# Patient Record
Sex: Female | Born: 1935
Health system: Southern US, Community
[De-identification: ages and names within clinical notes are randomized; demographics above are authoritative.]

## PROBLEM LIST (undated history)

## (undated) DIAGNOSIS — R079 Chest pain, unspecified: Secondary | ICD-10-CM

## (undated) DIAGNOSIS — J449 Chronic obstructive pulmonary disease, unspecified: Secondary | ICD-10-CM

## (undated) DIAGNOSIS — I5189 Other ill-defined heart diseases: Secondary | ICD-10-CM

## (undated) DIAGNOSIS — I480 Paroxysmal atrial fibrillation: Secondary | ICD-10-CM

## (undated) DIAGNOSIS — H269 Unspecified cataract: Secondary | ICD-10-CM

## (undated) DIAGNOSIS — I10 Essential (primary) hypertension: Secondary | ICD-10-CM

## (undated) HISTORY — DX: Chronic obstructive pulmonary disease, unspecified: J44.9

## (undated) HISTORY — PX: OTHER SURGICAL HISTORY: SHX169

## (undated) HISTORY — DX: Essential (primary) hypertension: I10

## (undated) HISTORY — PX: TOTAL HIP ARTHROPLASTY: SHX124

## (undated) HISTORY — DX: Unspecified cataract: H26.9

---
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
## 1959-03-12 HISTORY — PX: HERNIA REPAIR: SHX51

## 1992-07-11 HISTORY — PX: OTHER SURGICAL HISTORY: SHX169

## 1994-07-11 HISTORY — PX: OTHER SURGICAL HISTORY: SHX169

## 1998-07-11 HISTORY — PX: OTHER SURGICAL HISTORY: SHX169

## 2001-05-02 ENCOUNTER — Encounter (INDEPENDENT_AMBULATORY_CARE_PROVIDER_SITE_OTHER): Payer: Self-pay

## 2001-05-02 ENCOUNTER — Other Ambulatory Visit: Admission: RE | Admit: 2001-05-02 | Discharge: 2001-05-02 | Payer: Self-pay | Admitting: Gastroenterology

## 2001-06-18 ENCOUNTER — Other Ambulatory Visit: Admission: RE | Admit: 2001-06-18 | Discharge: 2001-06-18 | Payer: Self-pay | Admitting: Family Medicine

## 2004-03-25 ENCOUNTER — Other Ambulatory Visit: Payer: Self-pay

## 2004-03-31 ENCOUNTER — Ambulatory Visit (HOSPITAL_COMMUNITY): Admission: RE | Admit: 2004-03-31 | Discharge: 2004-03-31 | Payer: Self-pay | Admitting: *Deleted

## 2005-04-19 ENCOUNTER — Ambulatory Visit: Payer: Self-pay | Admitting: Family Medicine

## 2006-04-20 ENCOUNTER — Ambulatory Visit: Payer: Self-pay | Admitting: Family Medicine

## 2006-05-01 ENCOUNTER — Ambulatory Visit: Payer: Self-pay | Admitting: Gastroenterology

## 2006-05-15 ENCOUNTER — Ambulatory Visit: Payer: Self-pay | Admitting: Gastroenterology

## 2006-10-25 ENCOUNTER — Ambulatory Visit: Payer: Self-pay | Admitting: Family Medicine

## 2006-12-01 ENCOUNTER — Ambulatory Visit: Payer: Self-pay | Admitting: Internal Medicine

## 2006-12-06 ENCOUNTER — Ambulatory Visit: Payer: Self-pay | Admitting: Internal Medicine

## 2006-12-06 ENCOUNTER — Inpatient Hospital Stay (HOSPITAL_BASED_OUTPATIENT_CLINIC_OR_DEPARTMENT_OTHER): Admission: RE | Admit: 2006-12-06 | Discharge: 2006-12-06 | Payer: Self-pay | Admitting: Internal Medicine

## 2006-12-12 ENCOUNTER — Encounter: Payer: Self-pay | Admitting: Internal Medicine

## 2006-12-12 ENCOUNTER — Ambulatory Visit: Payer: Self-pay

## 2006-12-13 ENCOUNTER — Ambulatory Visit: Payer: Self-pay | Admitting: Internal Medicine

## 2007-01-08 ENCOUNTER — Ambulatory Visit: Payer: Self-pay | Admitting: Internal Medicine

## 2007-01-08 ENCOUNTER — Ambulatory Visit: Payer: Self-pay | Admitting: Critical Care Medicine

## 2007-02-26 ENCOUNTER — Ambulatory Visit: Payer: Self-pay | Admitting: Critical Care Medicine

## 2007-03-23 ENCOUNTER — Ambulatory Visit: Payer: Self-pay | Admitting: Critical Care Medicine

## 2007-08-06 DIAGNOSIS — J449 Chronic obstructive pulmonary disease, unspecified: Secondary | ICD-10-CM

## 2007-08-06 DIAGNOSIS — J438 Other emphysema: Secondary | ICD-10-CM | POA: Insufficient documentation

## 2007-08-06 DIAGNOSIS — J45909 Unspecified asthma, uncomplicated: Secondary | ICD-10-CM | POA: Insufficient documentation

## 2007-08-06 DIAGNOSIS — I10 Essential (primary) hypertension: Secondary | ICD-10-CM | POA: Insufficient documentation

## 2007-08-07 ENCOUNTER — Ambulatory Visit: Payer: Self-pay | Admitting: Critical Care Medicine

## 2007-12-13 ENCOUNTER — Ambulatory Visit: Payer: Self-pay | Admitting: Family Medicine

## 2007-12-25 ENCOUNTER — Ambulatory Visit: Payer: Self-pay | Admitting: Critical Care Medicine

## 2008-06-20 ENCOUNTER — Ambulatory Visit: Payer: Self-pay | Admitting: Critical Care Medicine

## 2008-11-12 ENCOUNTER — Ambulatory Visit: Payer: Self-pay | Admitting: Family Medicine

## 2008-12-15 ENCOUNTER — Ambulatory Visit: Payer: Self-pay | Admitting: Family Medicine

## 2010-01-04 ENCOUNTER — Ambulatory Visit: Payer: Self-pay | Admitting: Family Medicine

## 2010-10-20 ENCOUNTER — Ambulatory Visit: Payer: Self-pay | Admitting: Family Medicine

## 2010-11-16 ENCOUNTER — Ambulatory Visit: Payer: Self-pay | Admitting: Family Medicine

## 2010-11-16 LAB — HM DEXA SCAN

## 2010-11-23 NOTE — Assessment & Plan Note (Signed)
[facility]                             PULMONARY OFFICE NOTE   NAME:[name], [name] [name]                   MRN:          [id number]  DATE:03/23/2007                            DOB:          April 04, 1930    Ms. [name] is a 77 year old white female with a history of asthmatic  bronchitis, chronic obstructive lung disease, and primary emphysema.  The patient notes no real improvement on Spiriva and Asmanex, and  preferred the Advair product she was on previously.  Her degree of  hoarseness is only slightly less on Asmanex and Spiriva.   PHYSICAL EXAMINATION:  Temp 97, blood pressure 112/78, pulse 85.  Saturation 96% on room air.  CHEST:  Distant breath sounds without evidence of wheeze or rhonchi.  CARDIAC:  Regular rate and rhythm without S3.  Normal S1 and S2.  ABDOMEN:  Soft and nontender.  EXTREMITIES:  No clubbing or edema.  SKIN:  Clear.   IMPRESSION:  Chronic obstructive lung disease, asthmatic bronchitic  component.   PLAN:  Patient is to resume Advair 250/50 1 spray b.i.d. and discontinue  Spiriva and Asmanex.  We will see the patient back for a recheck in two  months.     Charlcie Cradle Delford Field, MD, [facility]  Electronically Signed    PEW/MedQ  DD: 03/23/2007  DT: 03/23/2007  Job #: [id number]   cc:   Julieanne Manson

## 2010-11-23 NOTE — Cardiovascular Report (Signed)
[name], [name]            ACCOUNT NO.:  [id number]   MEDICAL RECORD NO.:  [id number]          PATIENT TYPE:  OIB   LOCATION:  NA                           FACILITY:  [facility]   PHYSICIAN:  Bevelyn Buckles. Bensimhon, MDDATE OF BIRTH:  16-Oct-1929   DATE OF PROCEDURE:  12/06/2006  DATE OF DISCHARGE:                            CARDIAC CATHETERIZATION   PRIMARY CARE PHYSICIAN:  Julieanne Manson, M.D.   CARDIOLOGIST:  Bevelyn Buckles. Bensimhon, M.D.   PATIENT IDENTIFICATION:  [name] [name] is a delightful 77 year old  woman with a history of hypertension and COPD.  She presented to the  office with progressive dyspnea on exertion and mild chest pain.  The  risks and benefits of cardiac catheterization versus stress testing were  discussed with her and we decided to proceed with catheterization.  This  was performed in the outpatient catheterization laboratory.   PROCEDURES PERFORMED:  1. Right heart catheterization.  2. Left heart catheterization.  3. Left ventriculogram.  4. Selective coronary angiography.   DESCRIPTION OF PROCEDURE:  The risks and benefits of catheterization  were explained.  Consent was signed and placed on the chart.  A 4-French  arterial sheath was placed in the right femoral artery using a modified  Seldinger technique.  Standard catheters including JL-4, 3-D RC, and  angled pigtail were used.  All catheter exchanges were made over a wire.  There are no apparent complications.  A 7-French venous sheath was  placed in the right femoral vein using a modified Seldinger technique  and a standard right heart catheterization was performed with a Swan-  Ganz catheter.   HEMODYNAMICS:  Right atrial pressure mean of 3, RV pressure 23/0, PA  pressure 23/6-10 with a mean of 16.  Pulmonary capillary wedge pressure  mean of 4.  Central aortic pressure 133/63 with a mean of 94.  LV  pressure 127/1 with an EDP of 4.  There was no aortic stenosis.   Left main was normal.   LAD  was a long vessel coursing to the apex.  It was angiographically  normal.   Left circumflex gave off a large ramus, a small OM-1, a moderate-sized  OM-2, and small to moderate OM-3.  It was angiographically normal.   Right coronary artery was a dominant vessel.  It gave off a small- to  moderate-sized PDA and several small posterolaterals.  It was  angiographically normal.   Left ventriculogram done in the RAO projection showed an EF of 65% with  no wall motion abnormalities or mitral regurgitation.   ASSESSMENT:  1. Normal coronary arteries.  2. Normal filling pressures.  3. Normal left ventricle.   PLAN/DISCUSSION:  I expect her dyspnea may be related to her COPD.  There may be a component of gastroesophageal reflux.  Given her coronary  anatomy and right heart pressures, I do not think there is a significant  cardiac etiology here.  We will check PFTs and see her back in followup  as an outpatient.      Bevelyn Buckles. Bensimhon, MD  Electronically Signed     DRB/MEDQ  D:  12/06/2006  T:  12/06/2006  Job:  [id number]   cc:   Julieanne Manson

## 2010-11-23 NOTE — Assessment & Plan Note (Signed)
[facility] OFFICE NOTE   NAME:[name] [name], [name] [name]                   MRN:          [id number]  DATE:12/13/2006                            DOB:          1929-12-25    PRIMARY CARE PHYSICIAN:  [name] [name].   INTERVAL HISTORY:  [name] [name] is a delightful 77 year old woman with  history of hypertension, COPD, gastroesophageal reflux disease, and  cerebral aneurysm who presents for her post catheterization followup. We  initially saw her in the office last week and she was complaining of  severe progressive dyspnea and mild exertional chest pain. This was  concerning for possible underlying myocardial ischemia. A  catheterization showed normal coronary arteries with a normal left  ventricle and normal filling pressures with a wedge pressure of 4 and an  EDP of 4. The pulmonary artery pressure was normal at 23/8 with a mean  of 16. She subsequently also underwent 2D echocardiogram which showed an  ejection fraction of 70% to 75% which is mild diastolic dysfunction.  Chest x-ray showed emphysema with elevation of the left hemidiaphragm.   She returns today. Overall she says she is not much changed. She does  remain significantly short of breath with just mild-to-moderate  activity. No orthopnea, PND, no further chest pain. She had some  bruising at the catheterization site but no pain or other problems.   CURRENT MEDICATIONS:  1. Aspirin 81 mg 3 times a week.  2. Advair.  3. Hyzaar 100/12.5 a day.  4. Calcium.   PHYSICAL EXAMINATION:  She is well-appearing in no acute distress,  ambulates around the clinic without any respiratory difficultly. Blood  pressure initially 128/72, manual recheck 142/66, heart rate is 82,  weight is 156.  HEENT: Normal.  NECK: Supple, no JVD. Carotids are 2 + bilaterally without any bruits.  There is no lymphadenopathy or thyromegaly.  PMI is nondisplaced.  LUNGS: Clear  to auscultation. No wheezes or rales.  ABDOMEN: Obese, nontender, nondistended. No hepatosplenomegaly. No  bruits. No masses. Good bowel sounds.  EXTREMITIES: Warm with no cyanosis, clubbing, or edema. No rash, good  distal pulses.  NEURO: Alert and oriented x3. Cranial nerves II-XII are intact, moves  all 4 extremities without difficultly. Affect is pleasant.   ASSESSMENT/PLAN:  1. Dyspnea, given her normal cardiac workup I suspect this is mainly      related to her chronic obstructive pulmonary disease. We have      ordered PFTs and we will refer her to pulmonary. She has requested      [name] [name] and we will go ahead and try to set her up to      see him in the near future.  2. Hypertension, this is just mildly elevated. She will follow up with      [name] [name].   DISPOSITION:  Return to clinic on a p.r.n. basis.     [name] Buckles. Bensimhon, MD  Electronically Signed    DRB/MedQ  DD: 12/13/2006  DT: 12/13/2006  Job #: [id number]   cc:   [name] [name]. [name] Field, MD,  [facility]

## 2010-11-23 NOTE — Assessment & Plan Note (Signed)
[facility]                             PULMONARY OFFICE NOTE   NAME:Stann, LINET BRASH                   MRN:          [id number]  DATE:02/26/2007                            DOB:          January 20, 1930    SUBJECTIVE:  Ms. Pfannenstiel returns in followup after having been seen  in June for evaluation of COPD and primary emphysema.  She is on Spiriva  daily and with switched her off Advair because of hoarseness.  She  states her level of breathing is the same as she was on Advair and the  hoarseness is improved but she is having more mucous production.   OBJECTIVE:  VITAL SIGNS:  Temperature 97.  Blood pressure  126/86. Pulse  86. Saturation 98% on room air.  CHEST:  Showed distant breath sounds with prolonged expiratory phase.  Poor air flow.  CARDIAC:  Exam showed a regular rate and rhythm without S3.  Normal S1  and S2.  ABDOMEN:  Soft, nontender.  EXTREMITIES:  Showed no edema, clubbing or venous disease.  SKIN:  Clear.   IMPRESSION:  Chronic obstructive lung disease with asthmatic bronchitic  component.   PLAN:  The plan for the patient is to maintained inhaled medicines as  currently dosed with the exception of adding Asmanex two sprays daily.  She will maintain Spiriva as is and return in two weeks for a recheck.     Charlcie Cradle Delford Field, MD, [facility]  Electronically Signed    PEW/MedQ  DD: 02/26/2007  DT: 02/27/2007  Job #: [id number]   cc:   Bevelyn Buckles. Bensimhon, MD

## 2010-11-23 NOTE — Assessment & Plan Note (Signed)
[facility] OFFICE NOTE   NAME:[name] [name], [name] [name]                   MRN:          [id number]  DATE:12/01/2006                            DOB:          [birth year]    PRIMARY/REFERRING PHYSICIAN:  Dr. Julieanne Manson.   REASON FOR CONSULTATION:  Dyspnea on exertion and chest pain.   [name] [name] is a delightful 77 year old woman with multiple medical  problems including hypertension, COPD, cerebral aneurysm, severe  osteoarthritis, irritable bowel syndrome, and gastroesophageal reflux  disease. She saw Dr. Gerri Spore in 2005 for chest pain and shortness of  breath. At that time, she underwent a cardiac catheterization which  showed normal LV function. There was no significant obstructive coronary  artery disease. However, there was evidence of focal spasm of the distal  right coronary artery. It was suggested that she take Norvasc. However,  she did not start this, and she has felt fine for the past 3 years. Two  weeks ago, she saw Dr. Sullivan Lone who noticed her blood pressure was  running in the 150 range, so he doubled her Hyzaar. After that, she  began to feel run down and just not feel right. This past Monday, things  seemed to get worse. She began to notice significant dyspnea on exertion  with diaphoresis and some weakness. She said when her shortness of  breath got bad she did feel mild chest pain to the point where it was  hard to take a deep breath.   She denies any palpitations. She has not had any headaches. No lower  extremity edema. No orthopnea and no PND. Her blood pressure has been  somewhat labile. At rest, she says she feels fine. She denies any lower  extremity edema or neurologic deficits.   On review of systems, as her HPI and problem list.   PAST MEDICAL HISTORY:  1. Chest pain and shortness of breath.      a.     Cardiac catheterization September of 2005:  Normal LV       function,  minimal nonobstructive coronary artery disease with       focal spasm of the distal right coronary artery.      b.     Echocardiogram normal at the time.  2. Hypertension.  3. Cerebral aneurysm. Evaluated in [place], felt to be stable.  4. COPD.  5. Osteoporosis with several pathological fractures  6. Irritable bowel syndrome.  7. Gastroesophageal reflux disease.  8. History of a thyroid nodule status post resection.  9. History of chronic sinus tachycardia.  10.Obesity.   CURRENT MEDICATIONS:  1. Aspirin 81 mg 3 times a week.  2. Advair.  3. Hyzaar 100/12.5 daily.  4. Calcium.   ALLERGIES:  SULFA.   SOCIAL HISTORY:  She is widowed. She lives in [place]. She is  retired. Has a history of tobacco use half pack per day x4 years, quit  in 2004. She does drink 1 glass of red wine a day. She has 2 children.   FAMILY HISTORY:  Mother died at 35 from cancer. Father died at 51 of  cerebral hemorrhage. She has 1 brother who is alive.   PHYSICAL EXAMINATION:  She is a delightful woman who ambulates here in  the clinic without any respiratory difficulty. Blood pressure initially  was 135/84; on manual recheck, it was 118/60. Heart rate is 118. Weight  is 156.  HEENT:  Normal.  NECK:  Supple. There is no JVD. Carotids are 2+ bilaterally without  bruits. There is no lymphadenopathy. Question mild thyromegaly.  CARDIAC:  She is tachycardic and regular with no murmurs, rubs, or  gallops. Her PMI is not palpable.  LUNGS:  Are clear.  ABDOMEN:  Soft, nontender, nondistended, no hepatosplenomegaly, no  bruits, no masses, good bowel sounds.  EXTREMITIES:  Warm with no clubbing, cyanosis, or edema. No rash. She  does have multiple scars from previous surgeries. DP pulses are 2+  bilaterally.  NEUROLOGICAL:  She is alert and oriented x3. Cranial nerves II-XII are  intact. She moves all 4 extremities without difficulties. Affect is  delightful.   EKG shows sinus tachycardia with a  rate of 119. There is a left anterior  fascicular block as well as nonspecific ST-T wave abnormalities.   I performed a very brief bedside echocardiogram. This was limited by  very poor image quality. Overall LV function looks normal. RV does not  look dilated.  There is no significant pericardial effusion. There does  appear to be an epicardial fat pad.   ASSESSMENT AND PLAN:  1. Chest pain and shortness of breath. While there is some correlation      between the time frame of increasing her Hyzaar and her symptoms, I      am actually quite concerned that she may have underlying coronary      artery disease as a cause for her exertional symptoms. We discussed      the possibility of stress test versus catheterization, and we have      decided to proceed with right and left heart catheterization in the      [facility] lab next week. We did discuss the risks and benefits. She agrees      to proceed. She will also get a 2-D echocardiogram, and we will      check a D-dimer today.  2. Baseline tachycardia. This is a bit disturbing to me. However, she      says she has had it for a long time. We will go ahead and check a      thyroid panel as well as an echocardiogram and make sure she is not      anemic or have any other correctable causes.  3. Hypertension. Relatively well controlled.   DISPOSITION:  Pending the results of her catheterization and other  workup.     Bevelyn Buckles. Bensimhon, MD  Electronically Signed    DRB/MedQ  DD: 12/01/2006  DT: 12/01/2006  Job #: [id number]   cc:   Julieanne Manson

## 2010-11-23 NOTE — Assessment & Plan Note (Signed)
[facility]                             PULMONARY OFFICE NOTE   NAME:[name] [name], [name] [name]                   MRN:          [id number]  DATE:01/08/2007                            DOB:          07-15-1929    CHIEF COMPLAINT:  Evaluate COPD.   HISTORY OF PRESENT ILLNESS:  This is a 77 year old white female referred  for evaluation of chronic lung disease.  She has been diagnosed with  COPD for two and a half years.  Has been on Advair alone.  Three weeks  ago, had recent checkup, was found to be hypertensive.  She was referred  to Cardiology, underwent extensive evaluation.  No evidence of primary  heart disease was determined.  She is still having dyspnea.  No real  cough.  No chest pain.  She has clearing of throat.  She states the  Advair causes hoarseness.  She had been on Spiriva in the past, but when  she was on this she was also on the Advair and thought she was on too  much medicine.  Spiriva was then stopped a year and a half ago and not  restarted.  She has no real mucous production.  Denies any heartburn or  acid indigestion.  No history of pneumonia.  No change in weight.  No  abdominal pain.  No difficulty swallowing.  The patient denies any nasal  congestion, sneezing, itching, ear ache, anxiety or depression.  She  smoked a pack a day for 40 years, quit smoking 2004.  She is referred  for further evaluation.   PAST MEDICAL HISTORY:  1. Hypertension.  2. Emphysema.  3. No history of angina, heart disease, heart failure.  4. No history of stroke, cholesterol, diabetes, cancer, allergies,      chronic headaches.   OPERATIVE HISTORY:  1. Two hip surgeries.  2. One ankle surgery.  3. Eye surgery.   ALLERGIES:  SULFA.   SOCIAL HISTORY:  Swimming history as noted above.  Lives alone.  Is  retired.   FAMILY HISTORY:  Positive for asthma in mother, heart disease in father,  cancer in mother.   REVIEW OF SYSTEMS:  Otherwise,  noncontributory.   CURRENT MEDICATIONS:  1. Advair 100/50 one spray b.i.d.  2. Hyzaar 100/12.5 daily.  3. Calcium 1200 mg daily.  4. Aspirin 81 mg daily.  5 . Hydrocodone one daily.   PHYSICAL EXAMINATION:  GENERAL:  This is an elderly female in no  distress.  VITAL SIGNS:  Temperature 97.6, blood pressure 142/74, pulse 94,  saturation 94% on room air.  CHEST:  Distant breath sounds with prolonged expiratory phase.  No  wheeze or rhonchi were noted.  CARDIAC:  Regular rate and rhythm without S3.  Normal S1, S2.  ABDOMEN:  Soft, nontender .  EXTREMITIES:  No clubbing or edema.  SKIN:  Clear.  NEUROLOGICAL:  Intact.  HEENT:  No jugular venous distention.  No lymphadenopathy.  Oropharynx  clear.  NECK:  Supple.   LABORATORY DATA:  Spirometry showed an FEV1 of 1.29, FEC of 2.42,  FEV1/FEC ratio of 53% predicted.  Lung volumes  were not obtainable or  usable.  Diffusing capacity though was reduced to 56% of predicted .   IMPRESSION:  That of primary emphysema with no significant asthmatic  component in a patient with severe chronic obstructive lung disease.   RECOMMENDATIONS:  Discontinue further Advair and begin Spiriva one  capsule daily.  The patient was instructed as to its proper use, and we  will see the patient back in return follow up.     Charlcie Cradle Delford Field, MD, [facility]  Electronically Signed    PEW/MedQ  DD: 01/08/2007  DT: 01/09/2007  Job #: [id number]   cc:   Bevelyn Buckles. Bensimhon, MD  Julieanne Manson

## 2010-11-26 NOTE — Cardiovascular Report (Signed)
NAMENICEY, KRAH NO.:  [id number]   MEDICAL RECORD NO.:  [id number]          PATIENT TYPE:  OIB   LOCATION:  2864                         FACILITY:  [facility]   PHYSICIAN:  Carole Binning, M.D. LHCDATE OF BIRTH:  08-Jul-1930   DATE OF PROCEDURE:  DATE OF DISCHARGE:                              CARDIAC CATHETERIZATION   PROCEDURES:  Left heart catheterization with coronary angiography and left  ventriculography.   INDICATIONS:  Ms. Ventress is a 77 year old woman who presented last week  to [facility] with symptoms of new onset of rapidly  progressive exertional dyspnea.  She ruled out for myocardial infarction.  An echocardiogram was normal.  She also had a stress Myoview scan which was  interpreted as showing no ischemia on the Myoview images.  However, it was  felt that there was a high index of suspicion and thus cardiac  catheterization was recommended.  The patient was, therefore, referred for  cardiac catheterization to rule out coronary artery disease.   PROCEDURE NOTE:  A 6-French sheath was placed in the right femoral artery.  Coronary angiography was performed with standard Judkins 6-French catheters.  Intercoronary nitroglycerin was administrated in the right coronary artery  to relieve coronary vasospasm.  Left ventriculography was performed with an  angled pigtail catheter with Opaque contrast with Omnipaque.  There were no  complications.   HEMODYNAMIC DATA:  1.  Left ventricular pressure 102/8.  2.  Aortic pressure 114/72.  There is no aortic valve gradient.   LEFT VENTRICULOGRAM:  The left ventricle was hyperdynamic.  Ejection  fraction was estimated at 65%.  There was some mitral regurgitation present.  However, it appears to be due to a combination of ventricular ectopy in  catheter position.   CORONARY ENTEROGRAPHY (RIGHT DOMINANT):  1.  Left main is normal.  2.  Left anterior descending artery has a 20%  stenosis in the mid vessel.      The LAD was otherwise normal.  3.  Left circumflex gives rise to a large ramus intermedius, small first      obtuse marginal, normal sized second obtuse marginal and a small third      obtuse marginal branch.  The left circumflex was normal.  4.  Right coronary artery had a focal narrowing in the distal vessel which      appeared to be possibly spasm.  Intercoronary nitroglycerin was      administered which relieved this spasm completely with no residual      stenosis.  The right coronary artery was, otherwise, normal giving rise      to a normal sized posterior descending artery and a small posterolateral      branch.   IMPRESSION:  1.  Normal left ventricular systolic function.  2.  No fixed obstructive coronary artery disease.  There was a small area of      focal spasm in the distal right coronary artery which appeared to be      spontaneous which as relieved with intracoronary nitroglycerin.   RECOMMENDATIONS:  Medical therapy.  Her exertional dyspnea does not appear  to be cardiac in etiology.  Were she to have symptoms related to coronary  spasm, it would be expected to be spontaneous chest discomfort occurring at  rest.  She denies any of these types of symptoms.  Thus, would recommend  evaluation for alternative etiologies for the patient's shortness of breath.       MWP/MEDQ  D:  03/31/2004  T:  04/01/2004  Job:  [id number]   cc:   Julieanne Manson  [address], [address]  [place]  [place] [address]  Fax: [phone]   Cardiac Catheterization Laboratory

## 2010-12-30 ENCOUNTER — Encounter: Payer: Self-pay | Admitting: Internal Medicine

## 2011-04-19 ENCOUNTER — Ambulatory Visit: Payer: Self-pay | Admitting: Family Medicine

## 2011-06-28 ENCOUNTER — Encounter: Payer: Self-pay | Admitting: Gastroenterology

## 2011-09-06 DIAGNOSIS — L738 Other specified follicular disorders: Secondary | ICD-10-CM | POA: Diagnosis not present

## 2011-09-06 DIAGNOSIS — L821 Other seborrheic keratosis: Secondary | ICD-10-CM | POA: Diagnosis not present

## 2011-09-06 DIAGNOSIS — L820 Inflamed seborrheic keratosis: Secondary | ICD-10-CM | POA: Diagnosis not present

## 2011-11-02 DIAGNOSIS — I10 Essential (primary) hypertension: Secondary | ICD-10-CM | POA: Diagnosis not present

## 2011-11-02 DIAGNOSIS — M549 Dorsalgia, unspecified: Secondary | ICD-10-CM | POA: Diagnosis not present

## 2011-11-02 DIAGNOSIS — S32009A Unspecified fracture of unspecified lumbar vertebra, initial encounter for closed fracture: Secondary | ICD-10-CM | POA: Diagnosis not present

## 2011-11-02 DIAGNOSIS — E785 Hyperlipidemia, unspecified: Secondary | ICD-10-CM | POA: Diagnosis not present

## 2011-11-07 DIAGNOSIS — R5383 Other fatigue: Secondary | ICD-10-CM | POA: Diagnosis not present

## 2011-11-07 DIAGNOSIS — R5381 Other malaise: Secondary | ICD-10-CM | POA: Diagnosis not present

## 2011-11-07 DIAGNOSIS — E7800 Pure hypercholesterolemia, unspecified: Secondary | ICD-10-CM | POA: Diagnosis not present

## 2011-11-07 DIAGNOSIS — E559 Vitamin D deficiency, unspecified: Secondary | ICD-10-CM | POA: Diagnosis not present

## 2011-11-07 DIAGNOSIS — I10 Essential (primary) hypertension: Secondary | ICD-10-CM | POA: Diagnosis not present

## 2012-01-09 DIAGNOSIS — G4700 Insomnia, unspecified: Secondary | ICD-10-CM | POA: Diagnosis not present

## 2012-01-09 DIAGNOSIS — K279 Peptic ulcer, site unspecified, unspecified as acute or chronic, without hemorrhage or perforation: Secondary | ICD-10-CM | POA: Diagnosis not present

## 2012-01-09 DIAGNOSIS — I10 Essential (primary) hypertension: Secondary | ICD-10-CM | POA: Diagnosis not present

## 2012-01-09 DIAGNOSIS — R112 Nausea with vomiting, unspecified: Secondary | ICD-10-CM | POA: Diagnosis not present

## 2012-01-17 DIAGNOSIS — K279 Peptic ulcer, site unspecified, unspecified as acute or chronic, without hemorrhage or perforation: Secondary | ICD-10-CM | POA: Diagnosis not present

## 2012-01-17 DIAGNOSIS — R112 Nausea with vomiting, unspecified: Secondary | ICD-10-CM | POA: Diagnosis not present

## 2012-01-17 DIAGNOSIS — I10 Essential (primary) hypertension: Secondary | ICD-10-CM | POA: Diagnosis not present

## 2012-01-17 DIAGNOSIS — G4700 Insomnia, unspecified: Secondary | ICD-10-CM | POA: Diagnosis not present

## 2012-04-09 ENCOUNTER — Encounter: Payer: Self-pay | Admitting: Gastroenterology

## 2012-04-24 DIAGNOSIS — Z23 Encounter for immunization: Secondary | ICD-10-CM | POA: Diagnosis not present

## 2012-05-02 DIAGNOSIS — E7800 Pure hypercholesterolemia, unspecified: Secondary | ICD-10-CM | POA: Diagnosis not present

## 2012-05-02 DIAGNOSIS — R109 Unspecified abdominal pain: Secondary | ICD-10-CM | POA: Diagnosis not present

## 2012-05-02 DIAGNOSIS — J449 Chronic obstructive pulmonary disease, unspecified: Secondary | ICD-10-CM | POA: Diagnosis not present

## 2012-05-02 DIAGNOSIS — M810 Age-related osteoporosis without current pathological fracture: Secondary | ICD-10-CM | POA: Diagnosis not present

## 2012-05-28 DIAGNOSIS — N3090 Cystitis, unspecified without hematuria: Secondary | ICD-10-CM | POA: Diagnosis not present

## 2012-05-28 DIAGNOSIS — E7800 Pure hypercholesterolemia, unspecified: Secondary | ICD-10-CM | POA: Diagnosis not present

## 2012-05-28 DIAGNOSIS — R109 Unspecified abdominal pain: Secondary | ICD-10-CM | POA: Diagnosis not present

## 2012-05-28 DIAGNOSIS — M810 Age-related osteoporosis without current pathological fracture: Secondary | ICD-10-CM | POA: Diagnosis not present

## 2012-06-01 ENCOUNTER — Emergency Department: Payer: Self-pay | Admitting: Emergency Medicine

## 2012-06-01 DIAGNOSIS — IMO0002 Reserved for concepts with insufficient information to code with codable children: Secondary | ICD-10-CM | POA: Diagnosis not present

## 2012-06-01 DIAGNOSIS — I498 Other specified cardiac arrhythmias: Secondary | ICD-10-CM | POA: Diagnosis not present

## 2012-06-01 LAB — URINALYSIS, COMPLETE
Glucose,UR: NEGATIVE mg/dL (ref 0–75)
Nitrite: NEGATIVE
Protein: NEGATIVE
RBC,UR: 3 /HPF (ref 0–5)
WBC UR: 3 /HPF (ref 0–5)

## 2012-06-01 LAB — COMPREHENSIVE METABOLIC PANEL
Albumin: 3.7 g/dL (ref 3.4–5.0)
Alkaline Phosphatase: 123 U/L (ref 50–136)
Calcium, Total: 9.1 mg/dL (ref 8.5–10.1)
Co2: 24 mmol/L (ref 21–32)
Creatinine: 1.69 mg/dL — ABNORMAL HIGH (ref 0.60–1.30)
EGFR (African American): 33 — ABNORMAL LOW
EGFR (Non-African Amer.): 28 — ABNORMAL LOW
Glucose: 95 mg/dL (ref 65–99)
Osmolality: 280 (ref 275–301)
SGOT(AST): 20 U/L (ref 15–37)
SGPT (ALT): 16 U/L (ref 12–78)
Sodium: 138 mmol/L (ref 136–145)

## 2012-06-01 LAB — CK TOTAL AND CKMB (NOT AT ARMC)
CK, Total: 42 U/L (ref 21–215)
CK-MB: 0.9 ng/mL (ref 0.5–3.6)

## 2012-06-01 LAB — CBC
HCT: 37.2 % (ref 35.0–47.0)
HGB: 12.5 g/dL (ref 12.0–16.0)
MCH: 31.1 pg (ref 26.0–34.0)
MCHC: 33.7 g/dL (ref 32.0–36.0)
RDW: 13.6 % (ref 11.5–14.5)
WBC: 7.6 10*3/uL (ref 3.6–11.0)

## 2012-06-01 LAB — TROPONIN I: Troponin-I: <0.02 ng/mL

## 2012-06-12 DIAGNOSIS — H2510 Age-related nuclear cataract, unspecified eye: Secondary | ICD-10-CM | POA: Diagnosis not present

## 2012-06-13 ENCOUNTER — Ambulatory Visit: Payer: Self-pay | Admitting: Family Medicine

## 2012-06-13 DIAGNOSIS — R928 Other abnormal and inconclusive findings on diagnostic imaging of breast: Secondary | ICD-10-CM | POA: Diagnosis not present

## 2012-06-13 DIAGNOSIS — Z1231 Encounter for screening mammogram for malignant neoplasm of breast: Secondary | ICD-10-CM | POA: Diagnosis not present

## 2012-06-13 LAB — HM MAMMOGRAPHY

## 2012-07-11 HISTORY — PX: EYE SURGERY: SHX253

## 2012-07-20 DIAGNOSIS — H2510 Age-related nuclear cataract, unspecified eye: Secondary | ICD-10-CM | POA: Diagnosis not present

## 2012-10-03 DIAGNOSIS — J449 Chronic obstructive pulmonary disease, unspecified: Secondary | ICD-10-CM | POA: Diagnosis not present

## 2012-10-03 DIAGNOSIS — J309 Allergic rhinitis, unspecified: Secondary | ICD-10-CM | POA: Diagnosis not present

## 2012-10-03 DIAGNOSIS — Z23 Encounter for immunization: Secondary | ICD-10-CM | POA: Diagnosis not present

## 2012-10-03 DIAGNOSIS — I10 Essential (primary) hypertension: Secondary | ICD-10-CM | POA: Diagnosis not present

## 2012-11-30 DIAGNOSIS — R6889 Other general symptoms and signs: Secondary | ICD-10-CM | POA: Diagnosis not present

## 2012-12-01 ENCOUNTER — Emergency Department: Payer: Self-pay | Admitting: Emergency Medicine

## 2012-12-01 DIAGNOSIS — R918 Other nonspecific abnormal finding of lung field: Secondary | ICD-10-CM | POA: Diagnosis not present

## 2012-12-01 DIAGNOSIS — S2239XA Fracture of one rib, unspecified side, initial encounter for closed fracture: Secondary | ICD-10-CM | POA: Diagnosis not present

## 2012-12-01 DIAGNOSIS — M79609 Pain in unspecified limb: Secondary | ICD-10-CM | POA: Diagnosis not present

## 2012-12-01 DIAGNOSIS — R079 Chest pain, unspecified: Secondary | ICD-10-CM | POA: Diagnosis not present

## 2012-12-01 DIAGNOSIS — S298XXA Other specified injuries of thorax, initial encounter: Secondary | ICD-10-CM | POA: Diagnosis not present

## 2012-12-01 DIAGNOSIS — S93409A Sprain of unspecified ligament of unspecified ankle, initial encounter: Secondary | ICD-10-CM | POA: Diagnosis not present

## 2012-12-01 DIAGNOSIS — Z043 Encounter for examination and observation following other accident: Secondary | ICD-10-CM | POA: Diagnosis not present

## 2013-03-07 DIAGNOSIS — Z0000 Encounter for general adult medical examination without abnormal findings: Secondary | ICD-10-CM | POA: Diagnosis not present

## 2013-03-07 DIAGNOSIS — Z23 Encounter for immunization: Secondary | ICD-10-CM | POA: Diagnosis not present

## 2013-03-07 DIAGNOSIS — Z1339 Encounter for screening examination for other mental health and behavioral disorders: Secondary | ICD-10-CM | POA: Diagnosis not present

## 2013-03-07 DIAGNOSIS — Z1331 Encounter for screening for depression: Secondary | ICD-10-CM | POA: Diagnosis not present

## 2013-03-07 DIAGNOSIS — J309 Allergic rhinitis, unspecified: Secondary | ICD-10-CM | POA: Diagnosis not present

## 2013-03-14 DIAGNOSIS — H2510 Age-related nuclear cataract, unspecified eye: Secondary | ICD-10-CM | POA: Diagnosis not present

## 2013-04-08 DIAGNOSIS — H2510 Age-related nuclear cataract, unspecified eye: Secondary | ICD-10-CM | POA: Diagnosis not present

## 2013-04-23 ENCOUNTER — Ambulatory Visit: Payer: Self-pay | Admitting: Ophthalmology

## 2013-04-23 DIAGNOSIS — Z882 Allergy status to sulfonamides status: Secondary | ICD-10-CM | POA: Diagnosis not present

## 2013-04-23 DIAGNOSIS — I10 Essential (primary) hypertension: Secondary | ICD-10-CM | POA: Diagnosis not present

## 2013-04-23 DIAGNOSIS — H2510 Age-related nuclear cataract, unspecified eye: Secondary | ICD-10-CM | POA: Diagnosis not present

## 2013-04-23 DIAGNOSIS — H269 Unspecified cataract: Secondary | ICD-10-CM | POA: Diagnosis not present

## 2013-04-23 DIAGNOSIS — H9190 Unspecified hearing loss, unspecified ear: Secondary | ICD-10-CM | POA: Diagnosis not present

## 2013-04-23 DIAGNOSIS — J438 Other emphysema: Secondary | ICD-10-CM | POA: Diagnosis not present

## 2013-04-23 DIAGNOSIS — Z87891 Personal history of nicotine dependence: Secondary | ICD-10-CM | POA: Diagnosis not present

## 2013-04-23 DIAGNOSIS — Z79899 Other long term (current) drug therapy: Secondary | ICD-10-CM | POA: Diagnosis not present

## 2013-04-23 DIAGNOSIS — Z881 Allergy status to other antibiotic agents status: Secondary | ICD-10-CM | POA: Diagnosis not present

## 2013-04-23 DIAGNOSIS — R0602 Shortness of breath: Secondary | ICD-10-CM | POA: Diagnosis not present

## 2013-05-07 DIAGNOSIS — Z23 Encounter for immunization: Secondary | ICD-10-CM | POA: Diagnosis not present

## 2013-05-20 DIAGNOSIS — H903 Sensorineural hearing loss, bilateral: Secondary | ICD-10-CM | POA: Diagnosis not present

## 2013-05-20 DIAGNOSIS — H6120 Impacted cerumen, unspecified ear: Secondary | ICD-10-CM | POA: Diagnosis not present

## 2013-05-23 DIAGNOSIS — J449 Chronic obstructive pulmonary disease, unspecified: Secondary | ICD-10-CM | POA: Diagnosis not present

## 2013-05-23 DIAGNOSIS — M129 Arthropathy, unspecified: Secondary | ICD-10-CM | POA: Diagnosis not present

## 2013-05-23 DIAGNOSIS — R5381 Other malaise: Secondary | ICD-10-CM | POA: Diagnosis not present

## 2013-05-23 DIAGNOSIS — I10 Essential (primary) hypertension: Secondary | ICD-10-CM | POA: Diagnosis not present

## 2013-05-28 DIAGNOSIS — R5381 Other malaise: Secondary | ICD-10-CM | POA: Diagnosis not present

## 2013-05-28 DIAGNOSIS — E785 Hyperlipidemia, unspecified: Secondary | ICD-10-CM | POA: Diagnosis not present

## 2013-05-28 DIAGNOSIS — I10 Essential (primary) hypertension: Secondary | ICD-10-CM | POA: Diagnosis not present

## 2013-05-28 LAB — LIPID PANEL
CHOLESTEROL: 204 mg/dL — AB (ref 0–200)
HDL: 51 mg/dL (ref 35–70)
LDL Cholesterol: 133 mg/dL
TRIGLYCERIDES: 98 mg/dL (ref 40–160)

## 2013-06-11 ENCOUNTER — Ambulatory Visit: Payer: Self-pay | Admitting: Ophthalmology

## 2013-06-11 DIAGNOSIS — Z01810 Encounter for preprocedural cardiovascular examination: Secondary | ICD-10-CM | POA: Diagnosis not present

## 2013-06-11 DIAGNOSIS — Z01812 Encounter for preprocedural laboratory examination: Secondary | ICD-10-CM | POA: Diagnosis not present

## 2013-06-11 DIAGNOSIS — H2510 Age-related nuclear cataract, unspecified eye: Secondary | ICD-10-CM | POA: Diagnosis not present

## 2013-06-11 DIAGNOSIS — I10 Essential (primary) hypertension: Secondary | ICD-10-CM | POA: Diagnosis not present

## 2013-06-11 LAB — POTASSIUM: Potassium: 4.2 mmol/L (ref 3.5–5.1)

## 2013-06-18 ENCOUNTER — Ambulatory Visit: Payer: Self-pay | Admitting: Ophthalmology

## 2013-06-18 DIAGNOSIS — H2510 Age-related nuclear cataract, unspecified eye: Secondary | ICD-10-CM | POA: Diagnosis not present

## 2013-06-18 DIAGNOSIS — H269 Unspecified cataract: Secondary | ICD-10-CM | POA: Diagnosis not present

## 2013-06-18 DIAGNOSIS — Z881 Allergy status to other antibiotic agents status: Secondary | ICD-10-CM | POA: Diagnosis not present

## 2013-06-18 DIAGNOSIS — M810 Age-related osteoporosis without current pathological fracture: Secondary | ICD-10-CM | POA: Diagnosis not present

## 2013-06-18 DIAGNOSIS — H9190 Unspecified hearing loss, unspecified ear: Secondary | ICD-10-CM | POA: Diagnosis not present

## 2013-06-18 DIAGNOSIS — J438 Other emphysema: Secondary | ICD-10-CM | POA: Diagnosis not present

## 2013-06-18 DIAGNOSIS — I10 Essential (primary) hypertension: Secondary | ICD-10-CM | POA: Diagnosis not present

## 2013-06-18 DIAGNOSIS — M129 Arthropathy, unspecified: Secondary | ICD-10-CM | POA: Diagnosis not present

## 2013-06-18 DIAGNOSIS — Z8711 Personal history of peptic ulcer disease: Secondary | ICD-10-CM | POA: Diagnosis not present

## 2013-06-18 DIAGNOSIS — Z882 Allergy status to sulfonamides status: Secondary | ICD-10-CM | POA: Diagnosis not present

## 2013-06-18 DIAGNOSIS — E079 Disorder of thyroid, unspecified: Secondary | ICD-10-CM | POA: Diagnosis not present

## 2013-06-28 DIAGNOSIS — K5289 Other specified noninfective gastroenteritis and colitis: Secondary | ICD-10-CM | POA: Diagnosis not present

## 2013-06-28 DIAGNOSIS — R110 Nausea: Secondary | ICD-10-CM | POA: Diagnosis not present

## 2013-06-28 DIAGNOSIS — R109 Unspecified abdominal pain: Secondary | ICD-10-CM | POA: Diagnosis not present

## 2013-06-28 DIAGNOSIS — R634 Abnormal weight loss: Secondary | ICD-10-CM | POA: Diagnosis not present

## 2013-07-01 DIAGNOSIS — R109 Unspecified abdominal pain: Secondary | ICD-10-CM | POA: Diagnosis not present

## 2013-07-01 DIAGNOSIS — R5381 Other malaise: Secondary | ICD-10-CM | POA: Diagnosis not present

## 2013-07-01 DIAGNOSIS — R634 Abnormal weight loss: Secondary | ICD-10-CM | POA: Diagnosis not present

## 2013-07-05 ENCOUNTER — Ambulatory Visit: Payer: Self-pay | Admitting: Family Medicine

## 2013-07-05 DIAGNOSIS — D1803 Hemangioma of intra-abdominal structures: Secondary | ICD-10-CM | POA: Diagnosis not present

## 2013-07-05 DIAGNOSIS — N200 Calculus of kidney: Secondary | ICD-10-CM | POA: Diagnosis not present

## 2013-07-05 DIAGNOSIS — K8020 Calculus of gallbladder without cholecystitis without obstruction: Secondary | ICD-10-CM | POA: Diagnosis not present

## 2013-07-05 DIAGNOSIS — N281 Cyst of kidney, acquired: Secondary | ICD-10-CM | POA: Diagnosis not present

## 2013-07-05 DIAGNOSIS — D1800 Hemangioma unspecified site: Secondary | ICD-10-CM | POA: Diagnosis not present

## 2013-07-18 DIAGNOSIS — K2100 Gastro-esophageal reflux disease with esophagitis, without bleeding: Secondary | ICD-10-CM | POA: Diagnosis not present

## 2013-07-18 DIAGNOSIS — R109 Unspecified abdominal pain: Secondary | ICD-10-CM | POA: Diagnosis not present

## 2013-07-18 DIAGNOSIS — R634 Abnormal weight loss: Secondary | ICD-10-CM | POA: Diagnosis not present

## 2013-07-18 DIAGNOSIS — K219 Gastro-esophageal reflux disease without esophagitis: Secondary | ICD-10-CM | POA: Diagnosis not present

## 2013-07-30 DIAGNOSIS — R1033 Periumbilical pain: Secondary | ICD-10-CM | POA: Diagnosis not present

## 2013-09-24 DIAGNOSIS — R634 Abnormal weight loss: Secondary | ICD-10-CM | POA: Diagnosis not present

## 2013-09-24 DIAGNOSIS — R198 Other specified symptoms and signs involving the digestive system and abdomen: Secondary | ICD-10-CM | POA: Diagnosis not present

## 2013-09-24 DIAGNOSIS — K8020 Calculus of gallbladder without cholecystitis without obstruction: Secondary | ICD-10-CM | POA: Diagnosis not present

## 2013-09-24 DIAGNOSIS — R1084 Generalized abdominal pain: Secondary | ICD-10-CM | POA: Diagnosis not present

## 2013-10-18 ENCOUNTER — Ambulatory Visit: Payer: Self-pay | Admitting: Unknown Physician Specialty

## 2013-10-18 DIAGNOSIS — D126 Benign neoplasm of colon, unspecified: Secondary | ICD-10-CM | POA: Diagnosis not present

## 2013-10-18 DIAGNOSIS — K5730 Diverticulosis of large intestine without perforation or abscess without bleeding: Secondary | ICD-10-CM | POA: Diagnosis not present

## 2013-10-18 DIAGNOSIS — J449 Chronic obstructive pulmonary disease, unspecified: Secondary | ICD-10-CM | POA: Diagnosis not present

## 2013-10-18 DIAGNOSIS — K2270 Barrett's esophagus without dysplasia: Secondary | ICD-10-CM | POA: Diagnosis not present

## 2013-10-18 DIAGNOSIS — Z881 Allergy status to other antibiotic agents status: Secondary | ICD-10-CM | POA: Diagnosis not present

## 2013-10-18 DIAGNOSIS — K648 Other hemorrhoids: Secondary | ICD-10-CM | POA: Diagnosis not present

## 2013-10-18 DIAGNOSIS — Z79899 Other long term (current) drug therapy: Secondary | ICD-10-CM | POA: Diagnosis not present

## 2013-10-18 DIAGNOSIS — Z8711 Personal history of peptic ulcer disease: Secondary | ICD-10-CM | POA: Diagnosis not present

## 2013-10-18 DIAGNOSIS — M810 Age-related osteoporosis without current pathological fracture: Secondary | ICD-10-CM | POA: Diagnosis not present

## 2013-10-18 DIAGNOSIS — K2100 Gastro-esophageal reflux disease with esophagitis, without bleeding: Secondary | ICD-10-CM | POA: Diagnosis not present

## 2013-10-18 DIAGNOSIS — R1084 Generalized abdominal pain: Secondary | ICD-10-CM | POA: Diagnosis not present

## 2013-10-18 DIAGNOSIS — I10 Essential (primary) hypertension: Secondary | ICD-10-CM | POA: Diagnosis not present

## 2013-10-18 DIAGNOSIS — K449 Diaphragmatic hernia without obstruction or gangrene: Secondary | ICD-10-CM | POA: Diagnosis not present

## 2013-10-18 DIAGNOSIS — Z809 Family history of malignant neoplasm, unspecified: Secondary | ICD-10-CM | POA: Diagnosis not present

## 2013-10-18 DIAGNOSIS — R1013 Epigastric pain: Secondary | ICD-10-CM | POA: Diagnosis not present

## 2013-10-18 DIAGNOSIS — Z882 Allergy status to sulfonamides status: Secondary | ICD-10-CM | POA: Diagnosis not present

## 2013-10-18 DIAGNOSIS — Z96649 Presence of unspecified artificial hip joint: Secondary | ICD-10-CM | POA: Diagnosis not present

## 2013-10-18 DIAGNOSIS — Z87891 Personal history of nicotine dependence: Secondary | ICD-10-CM | POA: Diagnosis not present

## 2013-10-18 DIAGNOSIS — D128 Benign neoplasm of rectum: Secondary | ICD-10-CM | POA: Diagnosis not present

## 2013-10-18 LAB — HM COLONOSCOPY

## 2013-10-22 LAB — PATHOLOGY REPORT

## 2013-10-23 DIAGNOSIS — R5383 Other fatigue: Secondary | ICD-10-CM | POA: Diagnosis not present

## 2013-10-23 DIAGNOSIS — M810 Age-related osteoporosis without current pathological fracture: Secondary | ICD-10-CM | POA: Diagnosis not present

## 2013-10-23 DIAGNOSIS — J449 Chronic obstructive pulmonary disease, unspecified: Secondary | ICD-10-CM | POA: Diagnosis not present

## 2013-10-23 DIAGNOSIS — R5381 Other malaise: Secondary | ICD-10-CM | POA: Diagnosis not present

## 2013-10-23 DIAGNOSIS — R109 Unspecified abdominal pain: Secondary | ICD-10-CM | POA: Diagnosis not present

## 2013-10-23 DIAGNOSIS — M129 Arthropathy, unspecified: Secondary | ICD-10-CM | POA: Diagnosis not present

## 2013-10-28 DIAGNOSIS — D129 Benign neoplasm of anus and anal canal: Secondary | ICD-10-CM | POA: Diagnosis not present

## 2013-10-28 DIAGNOSIS — D128 Benign neoplasm of rectum: Secondary | ICD-10-CM | POA: Diagnosis not present

## 2013-11-04 ENCOUNTER — Ambulatory Visit: Payer: Self-pay | Admitting: Surgery

## 2013-11-04 DIAGNOSIS — R1013 Epigastric pain: Secondary | ICD-10-CM | POA: Diagnosis not present

## 2013-11-04 DIAGNOSIS — K2270 Barrett's esophagus without dysplasia: Secondary | ICD-10-CM | POA: Diagnosis not present

## 2013-11-04 DIAGNOSIS — Z86010 Personal history of colonic polyps: Secondary | ICD-10-CM | POA: Diagnosis not present

## 2013-11-08 ENCOUNTER — Other Ambulatory Visit: Payer: Self-pay | Admitting: Unknown Physician Specialty

## 2013-11-08 DIAGNOSIS — R197 Diarrhea, unspecified: Secondary | ICD-10-CM | POA: Diagnosis not present

## 2013-11-08 LAB — CLOSTRIDIUM DIFFICILE(ARMC)

## 2013-11-12 ENCOUNTER — Inpatient Hospital Stay: Payer: Self-pay | Admitting: Internal Medicine

## 2013-11-12 DIAGNOSIS — A048 Other specified bacterial intestinal infections: Secondary | ICD-10-CM | POA: Diagnosis present

## 2013-11-12 DIAGNOSIS — E872 Acidosis, unspecified: Secondary | ICD-10-CM | POA: Diagnosis not present

## 2013-11-12 DIAGNOSIS — K5289 Other specified noninfective gastroenteritis and colitis: Secondary | ICD-10-CM | POA: Diagnosis not present

## 2013-11-12 DIAGNOSIS — R630 Anorexia: Secondary | ICD-10-CM | POA: Diagnosis not present

## 2013-11-12 DIAGNOSIS — N200 Calculus of kidney: Secondary | ICD-10-CM | POA: Diagnosis present

## 2013-11-12 DIAGNOSIS — I129 Hypertensive chronic kidney disease with stage 1 through stage 4 chronic kidney disease, or unspecified chronic kidney disease: Secondary | ICD-10-CM | POA: Diagnosis present

## 2013-11-12 DIAGNOSIS — N183 Chronic kidney disease, stage 3 unspecified: Secondary | ICD-10-CM | POA: Diagnosis not present

## 2013-11-12 DIAGNOSIS — N184 Chronic kidney disease, stage 4 (severe): Secondary | ICD-10-CM | POA: Diagnosis not present

## 2013-11-12 DIAGNOSIS — R197 Diarrhea, unspecified: Secondary | ICD-10-CM | POA: Diagnosis not present

## 2013-11-12 DIAGNOSIS — D128 Benign neoplasm of rectum: Secondary | ICD-10-CM | POA: Diagnosis present

## 2013-11-12 DIAGNOSIS — N179 Acute kidney failure, unspecified: Secondary | ICD-10-CM | POA: Diagnosis not present

## 2013-11-12 DIAGNOSIS — IMO0002 Reserved for concepts with insufficient information to code with codable children: Secondary | ICD-10-CM | POA: Diagnosis not present

## 2013-11-12 DIAGNOSIS — K219 Gastro-esophageal reflux disease without esophagitis: Secondary | ICD-10-CM | POA: Diagnosis not present

## 2013-11-12 DIAGNOSIS — K649 Unspecified hemorrhoids: Secondary | ICD-10-CM | POA: Diagnosis present

## 2013-11-12 DIAGNOSIS — E440 Moderate protein-calorie malnutrition: Secondary | ICD-10-CM | POA: Diagnosis not present

## 2013-11-12 DIAGNOSIS — J4489 Other specified chronic obstructive pulmonary disease: Secondary | ICD-10-CM | POA: Diagnosis not present

## 2013-11-12 DIAGNOSIS — E860 Dehydration: Secondary | ICD-10-CM | POA: Diagnosis not present

## 2013-11-12 DIAGNOSIS — E876 Hypokalemia: Secondary | ICD-10-CM | POA: Diagnosis not present

## 2013-11-12 DIAGNOSIS — M810 Age-related osteoporosis without current pathological fracture: Secondary | ICD-10-CM | POA: Diagnosis present

## 2013-11-12 DIAGNOSIS — J449 Chronic obstructive pulmonary disease, unspecified: Secondary | ICD-10-CM | POA: Diagnosis not present

## 2013-11-12 LAB — COMPREHENSIVE METABOLIC PANEL
ALK PHOS: 112 U/L
ALT: 14 U/L (ref 12–78)
ANION GAP: 9 (ref 7–16)
Albumin: 3.8 g/dL (ref 3.4–5.0)
BUN: 35 mg/dL — AB (ref 7–18)
Bilirubin,Total: 0.4 mg/dL (ref 0.2–1.0)
CREATININE: 4.37 mg/dL — AB (ref 0.60–1.30)
Calcium, Total: 9.3 mg/dL (ref 8.5–10.1)
Chloride: 109 mmol/L — ABNORMAL HIGH (ref 98–107)
Co2: 21 mmol/L (ref 21–32)
EGFR (Non-African Amer.): 9 — ABNORMAL LOW
GFR CALC AF AMER: 10 — AB
Glucose: 115 mg/dL — ABNORMAL HIGH (ref 65–99)
Osmolality: 286 (ref 275–301)
POTASSIUM: 3 mmol/L — AB (ref 3.5–5.1)
SGOT(AST): 18 U/L (ref 15–37)
SODIUM: 139 mmol/L (ref 136–145)
Total Protein: 7.3 g/dL (ref 6.4–8.2)

## 2013-11-12 LAB — CBC WITH DIFFERENTIAL/PLATELET
Basophil #: 0.1 10*3/uL (ref 0.0–0.1)
Basophil %: 0.6 %
EOS ABS: 0.1 10*3/uL (ref 0.0–0.7)
Eosinophil %: 0.7 %
HCT: 46.5 % (ref 35.0–47.0)
HGB: 15.1 g/dL (ref 12.0–16.0)
Lymphocyte #: 0.8 10*3/uL — ABNORMAL LOW (ref 1.0–3.6)
Lymphocyte %: 9.4 %
MCH: 31.1 pg (ref 26.0–34.0)
MCHC: 32.5 g/dL (ref 32.0–36.0)
MCV: 96 fL (ref 80–100)
MONO ABS: 0.9 x10 3/mm (ref 0.2–0.9)
Monocyte %: 10 %
Neutrophil #: 7.1 10*3/uL — ABNORMAL HIGH (ref 1.4–6.5)
Neutrophil %: 79.3 %
Platelet: 266 10*3/uL (ref 150–440)
RBC: 4.85 10*6/uL (ref 3.80–5.20)
RDW: 14.4 % (ref 11.5–14.5)
WBC: 9 10*3/uL (ref 3.6–11.0)

## 2013-11-12 LAB — MAGNESIUM: Magnesium: 1 mg/dL — ABNORMAL LOW

## 2013-11-13 LAB — COMPREHENSIVE METABOLIC PANEL
ALBUMIN: 2.8 g/dL — AB (ref 3.4–5.0)
ALT: 10 U/L — AB (ref 12–78)
Alkaline Phosphatase: 80 U/L
Anion Gap: 7 (ref 7–16)
BUN: 37 mg/dL — ABNORMAL HIGH (ref 7–18)
Bilirubin,Total: 0.3 mg/dL (ref 0.2–1.0)
Calcium, Total: 8.1 mg/dL — ABNORMAL LOW (ref 8.5–10.1)
Chloride: 117 mmol/L — ABNORMAL HIGH (ref 98–107)
Co2: 19 mmol/L — ABNORMAL LOW (ref 21–32)
Creatinine: 4.69 mg/dL — ABNORMAL HIGH (ref 0.60–1.30)
GFR CALC AF AMER: 9 — AB
GFR CALC NON AF AMER: 8 — AB
Glucose: 105 mg/dL — ABNORMAL HIGH (ref 65–99)
Osmolality: 294 (ref 275–301)
POTASSIUM: 3.3 mmol/L — AB (ref 3.5–5.1)
SGOT(AST): 19 U/L (ref 15–37)
Sodium: 143 mmol/L (ref 136–145)
TOTAL PROTEIN: 5.5 g/dL — AB (ref 6.4–8.2)

## 2013-11-13 LAB — BASIC METABOLIC PANEL
Anion Gap: 8 (ref 7–16)
BUN: 36 mg/dL — ABNORMAL HIGH (ref 7–18)
CREATININE: 4.4 mg/dL — AB (ref 0.60–1.30)
Calcium, Total: 8.3 mg/dL — ABNORMAL LOW (ref 8.5–10.1)
Chloride: 118 mmol/L — ABNORMAL HIGH (ref 98–107)
Co2: 16 mmol/L — ABNORMAL LOW (ref 21–32)
EGFR (African American): 10 — ABNORMAL LOW
EGFR (Non-African Amer.): 9 — ABNORMAL LOW
Glucose: 100 mg/dL — ABNORMAL HIGH (ref 65–99)
Osmolality: 292 (ref 275–301)
Potassium: 3.5 mmol/L (ref 3.5–5.1)
SODIUM: 142 mmol/L (ref 136–145)

## 2013-11-13 LAB — CBC WITH DIFFERENTIAL/PLATELET
BASOS PCT: 0.5 %
Basophil #: 0 10*3/uL (ref 0.0–0.1)
Eosinophil #: 0.1 10*3/uL (ref 0.0–0.7)
Eosinophil %: 1.9 %
HCT: 34.8 % — ABNORMAL LOW (ref 35.0–47.0)
HGB: 11.6 g/dL — AB (ref 12.0–16.0)
Lymphocyte #: 1 10*3/uL (ref 1.0–3.6)
Lymphocyte %: 18.4 %
MCH: 31.6 pg (ref 26.0–34.0)
MCHC: 33.2 g/dL (ref 32.0–36.0)
MCV: 95 fL (ref 80–100)
Monocyte #: 0.8 x10 3/mm (ref 0.2–0.9)
Monocyte %: 15.2 %
Neutrophil #: 3.5 10*3/uL (ref 1.4–6.5)
Neutrophil %: 64 %
Platelet: 182 10*3/uL (ref 150–440)
RBC: 3.66 10*6/uL — AB (ref 3.80–5.20)
RDW: 14.3 % (ref 11.5–14.5)
WBC: 5.4 10*3/uL (ref 3.6–11.0)

## 2013-11-13 LAB — URINALYSIS, COMPLETE
Glucose,UR: NEGATIVE mg/dL (ref 0–75)
Ketone: NEGATIVE
NITRITE: NEGATIVE
PH: 6 (ref 4.5–8.0)
Protein: 100
RBC, UR: NONE SEEN /HPF (ref 0–5)
Specific Gravity: 1.03 (ref 1.003–1.030)

## 2013-11-13 LAB — MAGNESIUM: MAGNESIUM: 0.9 mg/dL — AB

## 2013-11-13 LAB — TSH: Thyroid Stimulating Horm: 0.886 u[IU]/mL

## 2013-11-13 LAB — PHOSPHORUS: PHOSPHORUS: 2.6 mg/dL (ref 2.5–4.9)

## 2013-11-13 LAB — STOOL CULTURE

## 2013-11-14 LAB — RENAL FUNCTION PANEL
ALBUMIN: 2.6 g/dL — AB (ref 3.4–5.0)
Anion Gap: 4 — ABNORMAL LOW (ref 7–16)
BUN: 33 mg/dL — ABNORMAL HIGH (ref 7–18)
Calcium, Total: 7.9 mg/dL — ABNORMAL LOW (ref 8.5–10.1)
Chloride: 122 mmol/L — ABNORMAL HIGH (ref 98–107)
Co2: 18 mmol/L — ABNORMAL LOW (ref 21–32)
Creatinine: 3.66 mg/dL — ABNORMAL HIGH (ref 0.60–1.30)
EGFR (African American): 13 — ABNORMAL LOW
GFR CALC NON AF AMER: 11 — AB
Glucose: 93 mg/dL (ref 65–99)
OSMOLALITY: 294 (ref 275–301)
PHOSPHORUS: 2.9 mg/dL (ref 2.5–4.9)
Potassium: 3.8 mmol/L (ref 3.5–5.1)
Sodium: 144 mmol/L (ref 136–145)

## 2013-11-14 LAB — PROTEIN / CREATININE RATIO, URINE
CREATININE, URINE: 218.6 mg/dL — AB (ref 30.0–125.0)
Protein, Random Urine: 49 mg/dL — ABNORMAL HIGH (ref 0–12)
Protein/Creat. Ratio: 224 mg/gCREAT — ABNORMAL HIGH (ref 0–200)

## 2013-11-15 LAB — BASIC METABOLIC PANEL
Anion Gap: 7 (ref 7–16)
BUN: 29 mg/dL — ABNORMAL HIGH (ref 7–18)
Calcium, Total: 8.3 mg/dL — ABNORMAL LOW (ref 8.5–10.1)
Chloride: 121 mmol/L — ABNORMAL HIGH (ref 98–107)
Co2: 17 mmol/L — ABNORMAL LOW (ref 21–32)
Creatinine: 2.54 mg/dL — ABNORMAL HIGH (ref 0.60–1.30)
EGFR (Non-African Amer.): 17 — ABNORMAL LOW
GFR CALC AF AMER: 20 — AB
GLUCOSE: 90 mg/dL (ref 65–99)
Osmolality: 294 (ref 275–301)
Potassium: 3.5 mmol/L (ref 3.5–5.1)
Sodium: 145 mmol/L (ref 136–145)

## 2013-11-15 LAB — MAGNESIUM: Magnesium: 1.2 mg/dL — ABNORMAL LOW

## 2013-11-15 LAB — PROTEIN ELECTROPHORESIS(ARMC)

## 2013-11-15 LAB — KAPPA/LAMBDA FREE LIGHT CHAINS (ARMC)

## 2013-11-16 LAB — BASIC METABOLIC PANEL
ANION GAP: 10 (ref 7–16)
BUN: 22 mg/dL — ABNORMAL HIGH (ref 7–18)
CREATININE: 1.6 mg/dL — AB (ref 0.60–1.30)
Calcium, Total: 8 mg/dL — ABNORMAL LOW (ref 8.5–10.1)
Chloride: 122 mmol/L — ABNORMAL HIGH (ref 98–107)
Co2: 14 mmol/L — ABNORMAL LOW (ref 21–32)
EGFR (African American): 35 — ABNORMAL LOW
EGFR (Non-African Amer.): 30 — ABNORMAL LOW
Glucose: 97 mg/dL (ref 65–99)
OSMOLALITY: 294 (ref 275–301)
Potassium: 3.8 mmol/L (ref 3.5–5.1)
SODIUM: 146 mmol/L — AB (ref 136–145)

## 2013-11-19 LAB — UR PROT ELECTROPHORESIS, URINE RANDOM

## 2013-11-21 ENCOUNTER — Other Ambulatory Visit: Payer: Self-pay | Admitting: Family Medicine

## 2013-11-21 DIAGNOSIS — R197 Diarrhea, unspecified: Secondary | ICD-10-CM | POA: Diagnosis not present

## 2013-11-21 DIAGNOSIS — E860 Dehydration: Secondary | ICD-10-CM | POA: Diagnosis not present

## 2013-11-21 LAB — CLOSTRIDIUM DIFFICILE(ARMC)

## 2013-11-23 LAB — WBCS, STOOL

## 2013-11-24 LAB — STOOL CULTURE

## 2013-11-25 DIAGNOSIS — R5383 Other fatigue: Secondary | ICD-10-CM | POA: Diagnosis not present

## 2013-11-25 DIAGNOSIS — M129 Arthropathy, unspecified: Secondary | ICD-10-CM | POA: Diagnosis not present

## 2013-11-25 DIAGNOSIS — R5381 Other malaise: Secondary | ICD-10-CM | POA: Diagnosis not present

## 2013-11-25 DIAGNOSIS — M779 Enthesopathy, unspecified: Secondary | ICD-10-CM | POA: Diagnosis not present

## 2013-11-25 DIAGNOSIS — K5190 Ulcerative colitis, unspecified, without complications: Secondary | ICD-10-CM | POA: Diagnosis not present

## 2013-11-25 DIAGNOSIS — R109 Unspecified abdominal pain: Secondary | ICD-10-CM | POA: Diagnosis not present

## 2013-12-04 ENCOUNTER — Inpatient Hospital Stay: Payer: Self-pay | Admitting: Internal Medicine

## 2013-12-04 DIAGNOSIS — E440 Moderate protein-calorie malnutrition: Secondary | ICD-10-CM | POA: Diagnosis not present

## 2013-12-04 DIAGNOSIS — R109 Unspecified abdominal pain: Secondary | ICD-10-CM | POA: Diagnosis not present

## 2013-12-04 DIAGNOSIS — K621 Rectal polyp: Secondary | ICD-10-CM | POA: Diagnosis present

## 2013-12-04 DIAGNOSIS — K2270 Barrett's esophagus without dysplasia: Secondary | ICD-10-CM | POA: Diagnosis present

## 2013-12-04 DIAGNOSIS — IMO0002 Reserved for concepts with insufficient information to code with codable children: Secondary | ICD-10-CM | POA: Diagnosis not present

## 2013-12-04 DIAGNOSIS — R5381 Other malaise: Secondary | ICD-10-CM | POA: Diagnosis not present

## 2013-12-04 DIAGNOSIS — I10 Essential (primary) hypertension: Secondary | ICD-10-CM | POA: Diagnosis not present

## 2013-12-04 DIAGNOSIS — E871 Hypo-osmolality and hyponatremia: Secondary | ICD-10-CM | POA: Diagnosis not present

## 2013-12-04 DIAGNOSIS — K219 Gastro-esophageal reflux disease without esophagitis: Secondary | ICD-10-CM | POA: Diagnosis present

## 2013-12-04 DIAGNOSIS — R197 Diarrhea, unspecified: Secondary | ICD-10-CM | POA: Diagnosis not present

## 2013-12-04 DIAGNOSIS — R5383 Other fatigue: Secondary | ICD-10-CM | POA: Diagnosis not present

## 2013-12-04 DIAGNOSIS — E860 Dehydration: Secondary | ICD-10-CM | POA: Diagnosis not present

## 2013-12-04 DIAGNOSIS — Z87891 Personal history of nicotine dependence: Secondary | ICD-10-CM | POA: Diagnosis not present

## 2013-12-04 DIAGNOSIS — I959 Hypotension, unspecified: Secondary | ICD-10-CM | POA: Diagnosis not present

## 2013-12-04 DIAGNOSIS — R110 Nausea: Secondary | ICD-10-CM | POA: Diagnosis not present

## 2013-12-04 DIAGNOSIS — E876 Hypokalemia: Secondary | ICD-10-CM | POA: Diagnosis not present

## 2013-12-04 DIAGNOSIS — K5730 Diverticulosis of large intestine without perforation or abscess without bleeding: Secondary | ICD-10-CM | POA: Diagnosis present

## 2013-12-04 DIAGNOSIS — I4891 Unspecified atrial fibrillation: Secondary | ICD-10-CM | POA: Diagnosis not present

## 2013-12-04 DIAGNOSIS — R799 Abnormal finding of blood chemistry, unspecified: Secondary | ICD-10-CM | POA: Diagnosis not present

## 2013-12-04 DIAGNOSIS — I248 Other forms of acute ischemic heart disease: Secondary | ICD-10-CM | POA: Diagnosis not present

## 2013-12-04 DIAGNOSIS — M810 Age-related osteoporosis without current pathological fracture: Secondary | ICD-10-CM | POA: Diagnosis present

## 2013-12-04 DIAGNOSIS — N183 Chronic kidney disease, stage 3 unspecified: Secondary | ICD-10-CM | POA: Diagnosis present

## 2013-12-04 DIAGNOSIS — K8020 Calculus of gallbladder without cholecystitis without obstruction: Secondary | ICD-10-CM | POA: Diagnosis present

## 2013-12-04 DIAGNOSIS — I129 Hypertensive chronic kidney disease with stage 1 through stage 4 chronic kidney disease, or unspecified chronic kidney disease: Secondary | ICD-10-CM | POA: Diagnosis present

## 2013-12-04 DIAGNOSIS — R634 Abnormal weight loss: Secondary | ICD-10-CM | POA: Diagnosis not present

## 2013-12-04 DIAGNOSIS — R748 Abnormal levels of other serum enzymes: Secondary | ICD-10-CM | POA: Diagnosis not present

## 2013-12-04 DIAGNOSIS — R0602 Shortness of breath: Secondary | ICD-10-CM | POA: Diagnosis not present

## 2013-12-04 DIAGNOSIS — J4489 Other specified chronic obstructive pulmonary disease: Secondary | ICD-10-CM | POA: Diagnosis not present

## 2013-12-04 DIAGNOSIS — K909 Intestinal malabsorption, unspecified: Secondary | ICD-10-CM | POA: Diagnosis present

## 2013-12-04 DIAGNOSIS — J449 Chronic obstructive pulmonary disease, unspecified: Secondary | ICD-10-CM | POA: Diagnosis not present

## 2013-12-04 DIAGNOSIS — E8809 Other disorders of plasma-protein metabolism, not elsewhere classified: Secondary | ICD-10-CM | POA: Diagnosis not present

## 2013-12-04 DIAGNOSIS — K620 Anal polyp: Secondary | ICD-10-CM | POA: Diagnosis present

## 2013-12-04 DIAGNOSIS — N170 Acute kidney failure with tubular necrosis: Secondary | ICD-10-CM | POA: Diagnosis not present

## 2013-12-04 LAB — MAGNESIUM: Magnesium: 1.9 mg/dL

## 2013-12-04 LAB — COMPREHENSIVE METABOLIC PANEL
ALBUMIN: 2.2 g/dL — AB (ref 3.4–5.0)
ALK PHOS: 95 U/L
Anion Gap: 12 (ref 7–16)
BILIRUBIN TOTAL: 0.5 mg/dL (ref 0.2–1.0)
BUN: 28 mg/dL — ABNORMAL HIGH (ref 7–18)
CO2: 32 mmol/L (ref 21–32)
CREATININE: 1.83 mg/dL — AB (ref 0.60–1.30)
Calcium, Total: 8.1 mg/dL — ABNORMAL LOW (ref 8.5–10.1)
Chloride: 104 mmol/L (ref 98–107)
EGFR (Non-African Amer.): 25 — ABNORMAL LOW
GFR CALC AF AMER: 29 — AB
Glucose: 113 mg/dL — ABNORMAL HIGH (ref 65–99)
OSMOLALITY: 301 (ref 275–301)
POTASSIUM: 2.3 mmol/L — AB (ref 3.5–5.1)
SGOT(AST): 23 U/L (ref 15–37)
SGPT (ALT): 10 U/L — ABNORMAL LOW (ref 12–78)
Sodium: 148 mmol/L — ABNORMAL HIGH (ref 136–145)
TOTAL PROTEIN: 5.7 g/dL — AB (ref 6.4–8.2)

## 2013-12-04 LAB — URINALYSIS, COMPLETE
Bilirubin,UR: NEGATIVE
GLUCOSE, UR: NEGATIVE mg/dL (ref 0–75)
Hyaline Cast: 3
Ketone: NEGATIVE
Leukocyte Esterase: NEGATIVE
NITRITE: NEGATIVE
Ph: 5 (ref 4.5–8.0)
Protein: 30
RBC,UR: 7 /HPF (ref 0–5)
Specific Gravity: 1.014 (ref 1.003–1.030)
Squamous Epithelial: <1
WBC UR: 1 /HPF (ref 0–5)

## 2013-12-04 LAB — CBC
HCT: 35.5 % (ref 35.0–47.0)
HGB: 11.5 g/dL — ABNORMAL LOW (ref 12.0–16.0)
MCH: 30.1 pg (ref 26.0–34.0)
MCHC: 32.3 g/dL (ref 32.0–36.0)
MCV: 93 fL (ref 80–100)
Platelet: 428 10*3/uL (ref 150–440)
RBC: 3.8 10*6/uL (ref 3.80–5.20)
RDW: 14.9 % — AB (ref 11.5–14.5)
WBC: 11.6 10*3/uL — AB (ref 3.6–11.0)

## 2013-12-04 LAB — APTT: Activated PTT: <23 secs — ABNORMAL LOW (ref 23.6–35.9)

## 2013-12-04 LAB — CK-MB
CK-MB: 3.4 ng/mL (ref 0.5–3.6)
CK-MB: 4.7 ng/mL — AB (ref 0.5–3.6)
CK-MB: 5.3 ng/mL — AB (ref 0.5–3.6)

## 2013-12-04 LAB — TROPONIN I
TROPONIN-I: 0.53 ng/mL — AB
Troponin-I: 0.9 ng/mL — ABNORMAL HIGH

## 2013-12-04 LAB — TSH: Thyroid Stimulating Horm: 0.827 u[IU]/mL

## 2013-12-05 LAB — CBC WITH DIFFERENTIAL/PLATELET
Basophil #: 0.1 10*3/uL (ref 0.0–0.1)
Basophil %: 0.7 %
Eosinophil #: 0.2 10*3/uL (ref 0.0–0.7)
Eosinophil %: 1.8 %
HCT: 31.4 % — ABNORMAL LOW (ref 35.0–47.0)
HGB: 10 g/dL — ABNORMAL LOW (ref 12.0–16.0)
Lymphocyte #: 1.8 10*3/uL (ref 1.0–3.6)
Lymphocyte %: 17.6 %
MCH: 29.8 pg (ref 26.0–34.0)
MCHC: 31.7 g/dL — AB (ref 32.0–36.0)
MCV: 94 fL (ref 80–100)
MONO ABS: 0.7 x10 3/mm (ref 0.2–0.9)
Monocyte %: 6.4 %
NEUTROS ABS: 7.4 10*3/uL — AB (ref 1.4–6.5)
Neutrophil %: 73.5 %
PLATELETS: 415 10*3/uL (ref 150–440)
RBC: 3.34 10*6/uL — ABNORMAL LOW (ref 3.80–5.20)
RDW: 15 % — ABNORMAL HIGH (ref 11.5–14.5)
WBC: 10.1 10*3/uL (ref 3.6–11.0)

## 2013-12-05 LAB — COMPREHENSIVE METABOLIC PANEL
AST: 27 U/L (ref 15–37)
Albumin: 2.1 g/dL — ABNORMAL LOW (ref 3.4–5.0)
Alkaline Phosphatase: 84 U/L
Anion Gap: 8 (ref 7–16)
BUN: 27 mg/dL — AB (ref 7–18)
Bilirubin,Total: 0.3 mg/dL (ref 0.2–1.0)
CHLORIDE: 109 mmol/L — AB (ref 98–107)
CO2: 30 mmol/L (ref 21–32)
Calcium, Total: 7.7 mg/dL — ABNORMAL LOW (ref 8.5–10.1)
Creatinine: 1.82 mg/dL — ABNORMAL HIGH (ref 0.60–1.30)
EGFR (African American): 30 — ABNORMAL LOW
EGFR (Non-African Amer.): 26 — ABNORMAL LOW
Glucose: 104 mg/dL — ABNORMAL HIGH (ref 65–99)
OSMOLALITY: 298 (ref 275–301)
POTASSIUM: 2.6 mmol/L — AB (ref 3.5–5.1)
SGPT (ALT): 8 U/L — ABNORMAL LOW (ref 12–78)
Sodium: 147 mmol/L — ABNORMAL HIGH (ref 136–145)
Total Protein: 5.2 g/dL — ABNORMAL LOW (ref 6.4–8.2)

## 2013-12-05 LAB — CLOSTRIDIUM DIFFICILE(ARMC)

## 2013-12-05 LAB — TROPONIN I: Troponin-I: 0.83 ng/mL — ABNORMAL HIGH

## 2013-12-06 LAB — COMPREHENSIVE METABOLIC PANEL
ALBUMIN: 1.9 g/dL — AB (ref 3.4–5.0)
ALK PHOS: 88 U/L
AST: 29 U/L (ref 15–37)
Anion Gap: 4 — ABNORMAL LOW (ref 7–16)
BUN: 20 mg/dL — AB (ref 7–18)
Bilirubin,Total: 0.2 mg/dL (ref 0.2–1.0)
CO2: 30 mmol/L (ref 21–32)
Calcium, Total: 8.2 mg/dL — ABNORMAL LOW (ref 8.5–10.1)
Chloride: 111 mmol/L — ABNORMAL HIGH (ref 98–107)
Creatinine: 1.44 mg/dL — ABNORMAL HIGH (ref 0.60–1.30)
EGFR (African American): 39 — ABNORMAL LOW
EGFR (Non-African Amer.): 34 — ABNORMAL LOW
Glucose: 101 mg/dL — ABNORMAL HIGH (ref 65–99)
OSMOLALITY: 291 (ref 275–301)
Potassium: 3.9 mmol/L (ref 3.5–5.1)
SGPT (ALT): 10 U/L — ABNORMAL LOW (ref 12–78)
SODIUM: 145 mmol/L (ref 136–145)
TOTAL PROTEIN: 5 g/dL — AB (ref 6.4–8.2)

## 2013-12-06 LAB — MAGNESIUM: Magnesium: 1.5 mg/dL — ABNORMAL LOW

## 2013-12-07 LAB — BASIC METABOLIC PANEL
Anion Gap: 6 — ABNORMAL LOW (ref 7–16)
BUN: 19 mg/dL — ABNORMAL HIGH (ref 7–18)
CHLORIDE: 108 mmol/L — AB (ref 98–107)
Calcium, Total: 8.5 mg/dL (ref 8.5–10.1)
Co2: 31 mmol/L (ref 21–32)
Creatinine: 1.48 mg/dL — ABNORMAL HIGH (ref 0.60–1.30)
EGFR (African American): 38 — ABNORMAL LOW
EGFR (Non-African Amer.): 33 — ABNORMAL LOW
Glucose: 95 mg/dL (ref 65–99)
Osmolality: 291 (ref 275–301)
Potassium: 3.7 mmol/L (ref 3.5–5.1)
SODIUM: 145 mmol/L (ref 136–145)

## 2013-12-09 LAB — PLATELET COUNT: PLATELETS: 315 10*3/uL (ref 150–440)

## 2013-12-09 LAB — STOOL CULTURE

## 2013-12-10 DIAGNOSIS — M810 Age-related osteoporosis without current pathological fracture: Secondary | ICD-10-CM | POA: Diagnosis not present

## 2013-12-10 DIAGNOSIS — I10 Essential (primary) hypertension: Secondary | ICD-10-CM | POA: Diagnosis not present

## 2013-12-10 DIAGNOSIS — IMO0001 Reserved for inherently not codable concepts without codable children: Secondary | ICD-10-CM | POA: Diagnosis not present

## 2013-12-10 DIAGNOSIS — I4891 Unspecified atrial fibrillation: Secondary | ICD-10-CM | POA: Diagnosis not present

## 2013-12-10 DIAGNOSIS — M6281 Muscle weakness (generalized): Secondary | ICD-10-CM | POA: Diagnosis not present

## 2013-12-10 LAB — PROTEIN ELECTROPHORESIS(ARMC)

## 2013-12-12 DIAGNOSIS — N390 Urinary tract infection, site not specified: Secondary | ICD-10-CM | POA: Diagnosis not present

## 2013-12-13 DIAGNOSIS — IMO0001 Reserved for inherently not codable concepts without codable children: Secondary | ICD-10-CM | POA: Diagnosis not present

## 2013-12-13 DIAGNOSIS — I4891 Unspecified atrial fibrillation: Secondary | ICD-10-CM | POA: Diagnosis not present

## 2013-12-13 DIAGNOSIS — M810 Age-related osteoporosis without current pathological fracture: Secondary | ICD-10-CM | POA: Diagnosis not present

## 2013-12-13 DIAGNOSIS — M6281 Muscle weakness (generalized): Secondary | ICD-10-CM | POA: Diagnosis not present

## 2013-12-13 DIAGNOSIS — I10 Essential (primary) hypertension: Secondary | ICD-10-CM | POA: Diagnosis not present

## 2013-12-16 DIAGNOSIS — R197 Diarrhea, unspecified: Secondary | ICD-10-CM | POA: Diagnosis not present

## 2013-12-17 DIAGNOSIS — R5383 Other fatigue: Secondary | ICD-10-CM | POA: Diagnosis not present

## 2013-12-17 DIAGNOSIS — R5381 Other malaise: Secondary | ICD-10-CM | POA: Diagnosis not present

## 2013-12-17 DIAGNOSIS — I10 Essential (primary) hypertension: Secondary | ICD-10-CM | POA: Diagnosis not present

## 2013-12-17 DIAGNOSIS — M810 Age-related osteoporosis without current pathological fracture: Secondary | ICD-10-CM | POA: Diagnosis not present

## 2013-12-17 DIAGNOSIS — E860 Dehydration: Secondary | ICD-10-CM | POA: Diagnosis not present

## 2013-12-17 DIAGNOSIS — I4891 Unspecified atrial fibrillation: Secondary | ICD-10-CM | POA: Diagnosis not present

## 2013-12-17 DIAGNOSIS — M129 Arthropathy, unspecified: Secondary | ICD-10-CM | POA: Diagnosis not present

## 2013-12-17 DIAGNOSIS — M6281 Muscle weakness (generalized): Secondary | ICD-10-CM | POA: Diagnosis not present

## 2013-12-17 DIAGNOSIS — R109 Unspecified abdominal pain: Secondary | ICD-10-CM | POA: Diagnosis not present

## 2013-12-17 DIAGNOSIS — IMO0001 Reserved for inherently not codable concepts without codable children: Secondary | ICD-10-CM | POA: Diagnosis not present

## 2013-12-17 DIAGNOSIS — K5289 Other specified noninfective gastroenteritis and colitis: Secondary | ICD-10-CM | POA: Diagnosis not present

## 2013-12-19 DIAGNOSIS — M6281 Muscle weakness (generalized): Secondary | ICD-10-CM | POA: Diagnosis not present

## 2013-12-19 DIAGNOSIS — IMO0001 Reserved for inherently not codable concepts without codable children: Secondary | ICD-10-CM | POA: Diagnosis not present

## 2013-12-19 DIAGNOSIS — M810 Age-related osteoporosis without current pathological fracture: Secondary | ICD-10-CM | POA: Diagnosis not present

## 2013-12-19 DIAGNOSIS — I10 Essential (primary) hypertension: Secondary | ICD-10-CM | POA: Diagnosis not present

## 2013-12-19 DIAGNOSIS — I4891 Unspecified atrial fibrillation: Secondary | ICD-10-CM | POA: Diagnosis not present

## 2013-12-23 DIAGNOSIS — I4891 Unspecified atrial fibrillation: Secondary | ICD-10-CM | POA: Diagnosis not present

## 2013-12-23 DIAGNOSIS — IMO0001 Reserved for inherently not codable concepts without codable children: Secondary | ICD-10-CM | POA: Diagnosis not present

## 2013-12-23 DIAGNOSIS — M6281 Muscle weakness (generalized): Secondary | ICD-10-CM | POA: Diagnosis not present

## 2013-12-23 DIAGNOSIS — I10 Essential (primary) hypertension: Secondary | ICD-10-CM | POA: Diagnosis not present

## 2013-12-23 DIAGNOSIS — M810 Age-related osteoporosis without current pathological fracture: Secondary | ICD-10-CM | POA: Diagnosis not present

## 2013-12-23 DIAGNOSIS — R262 Difficulty in walking, not elsewhere classified: Secondary | ICD-10-CM | POA: Diagnosis not present

## 2013-12-27 DIAGNOSIS — I4891 Unspecified atrial fibrillation: Secondary | ICD-10-CM | POA: Diagnosis not present

## 2013-12-27 DIAGNOSIS — I10 Essential (primary) hypertension: Secondary | ICD-10-CM | POA: Diagnosis not present

## 2013-12-27 DIAGNOSIS — IMO0001 Reserved for inherently not codable concepts without codable children: Secondary | ICD-10-CM | POA: Diagnosis not present

## 2013-12-27 DIAGNOSIS — M810 Age-related osteoporosis without current pathological fracture: Secondary | ICD-10-CM | POA: Diagnosis not present

## 2013-12-27 DIAGNOSIS — M6281 Muscle weakness (generalized): Secondary | ICD-10-CM | POA: Diagnosis not present

## 2014-01-06 DIAGNOSIS — M6281 Muscle weakness (generalized): Secondary | ICD-10-CM | POA: Diagnosis not present

## 2014-01-06 DIAGNOSIS — IMO0001 Reserved for inherently not codable concepts without codable children: Secondary | ICD-10-CM | POA: Diagnosis not present

## 2014-01-06 DIAGNOSIS — M810 Age-related osteoporosis without current pathological fracture: Secondary | ICD-10-CM | POA: Diagnosis not present

## 2014-01-06 DIAGNOSIS — I4891 Unspecified atrial fibrillation: Secondary | ICD-10-CM | POA: Diagnosis not present

## 2014-01-06 DIAGNOSIS — I10 Essential (primary) hypertension: Secondary | ICD-10-CM | POA: Diagnosis not present

## 2014-01-08 DIAGNOSIS — I4891 Unspecified atrial fibrillation: Secondary | ICD-10-CM | POA: Diagnosis not present

## 2014-01-08 DIAGNOSIS — N390 Urinary tract infection, site not specified: Secondary | ICD-10-CM | POA: Diagnosis not present

## 2014-01-08 DIAGNOSIS — K5289 Other specified noninfective gastroenteritis and colitis: Secondary | ICD-10-CM | POA: Diagnosis not present

## 2014-01-08 DIAGNOSIS — I10 Essential (primary) hypertension: Secondary | ICD-10-CM | POA: Diagnosis not present

## 2014-01-08 DIAGNOSIS — E860 Dehydration: Secondary | ICD-10-CM | POA: Diagnosis not present

## 2014-01-09 DIAGNOSIS — I10 Essential (primary) hypertension: Secondary | ICD-10-CM | POA: Diagnosis not present

## 2014-01-09 DIAGNOSIS — I4891 Unspecified atrial fibrillation: Secondary | ICD-10-CM | POA: Diagnosis not present

## 2014-01-09 DIAGNOSIS — IMO0001 Reserved for inherently not codable concepts without codable children: Secondary | ICD-10-CM | POA: Diagnosis not present

## 2014-01-09 DIAGNOSIS — M6281 Muscle weakness (generalized): Secondary | ICD-10-CM | POA: Diagnosis not present

## 2014-01-09 DIAGNOSIS — M810 Age-related osteoporosis without current pathological fracture: Secondary | ICD-10-CM | POA: Diagnosis not present

## 2014-02-11 DIAGNOSIS — I4891 Unspecified atrial fibrillation: Secondary | ICD-10-CM | POA: Diagnosis not present

## 2014-02-11 DIAGNOSIS — I10 Essential (primary) hypertension: Secondary | ICD-10-CM | POA: Diagnosis not present

## 2014-02-11 DIAGNOSIS — R9431 Abnormal electrocardiogram [ECG] [EKG]: Secondary | ICD-10-CM | POA: Diagnosis not present

## 2014-02-11 DIAGNOSIS — R5383 Other fatigue: Secondary | ICD-10-CM | POA: Diagnosis not present

## 2014-02-11 DIAGNOSIS — R5381 Other malaise: Secondary | ICD-10-CM | POA: Diagnosis not present

## 2014-02-11 DIAGNOSIS — R634 Abnormal weight loss: Secondary | ICD-10-CM | POA: Diagnosis not present

## 2014-02-27 DIAGNOSIS — Z961 Presence of intraocular lens: Secondary | ICD-10-CM | POA: Diagnosis not present

## 2014-02-27 DIAGNOSIS — H2510 Age-related nuclear cataract, unspecified eye: Secondary | ICD-10-CM | POA: Diagnosis not present

## 2014-03-06 DIAGNOSIS — R5383 Other fatigue: Secondary | ICD-10-CM | POA: Diagnosis not present

## 2014-03-06 DIAGNOSIS — K5289 Other specified noninfective gastroenteritis and colitis: Secondary | ICD-10-CM | POA: Diagnosis not present

## 2014-03-06 DIAGNOSIS — R634 Abnormal weight loss: Secondary | ICD-10-CM | POA: Diagnosis not present

## 2014-03-06 DIAGNOSIS — R5381 Other malaise: Secondary | ICD-10-CM | POA: Diagnosis not present

## 2014-03-06 DIAGNOSIS — I4891 Unspecified atrial fibrillation: Secondary | ICD-10-CM | POA: Diagnosis not present

## 2014-03-06 DIAGNOSIS — I10 Essential (primary) hypertension: Secondary | ICD-10-CM | POA: Diagnosis not present

## 2014-03-11 DIAGNOSIS — D128 Benign neoplasm of rectum: Secondary | ICD-10-CM | POA: Diagnosis not present

## 2014-03-11 DIAGNOSIS — K2270 Barrett's esophagus without dysplasia: Secondary | ICD-10-CM | POA: Diagnosis not present

## 2014-03-11 DIAGNOSIS — D129 Benign neoplasm of anus and anal canal: Secondary | ICD-10-CM | POA: Diagnosis not present

## 2014-04-02 DIAGNOSIS — D128 Benign neoplasm of rectum: Secondary | ICD-10-CM | POA: Diagnosis not present

## 2014-04-02 DIAGNOSIS — D129 Benign neoplasm of anus and anal canal: Secondary | ICD-10-CM | POA: Diagnosis not present

## 2014-04-07 ENCOUNTER — Ambulatory Visit: Payer: Self-pay | Admitting: Surgery

## 2014-04-07 DIAGNOSIS — Z9889 Other specified postprocedural states: Secondary | ICD-10-CM | POA: Diagnosis not present

## 2014-04-07 DIAGNOSIS — Z01812 Encounter for preprocedural laboratory examination: Secondary | ICD-10-CM | POA: Diagnosis not present

## 2014-04-07 DIAGNOSIS — K219 Gastro-esophageal reflux disease without esophagitis: Secondary | ICD-10-CM | POA: Diagnosis not present

## 2014-04-07 DIAGNOSIS — K620 Anal polyp: Secondary | ICD-10-CM | POA: Diagnosis not present

## 2014-04-07 DIAGNOSIS — R197 Diarrhea, unspecified: Secondary | ICD-10-CM | POA: Diagnosis not present

## 2014-04-07 DIAGNOSIS — Z889 Allergy status to unspecified drugs, medicaments and biological substances status: Secondary | ICD-10-CM | POA: Diagnosis not present

## 2014-04-07 DIAGNOSIS — M810 Age-related osteoporosis without current pathological fracture: Secondary | ICD-10-CM | POA: Diagnosis not present

## 2014-04-07 DIAGNOSIS — I10 Essential (primary) hypertension: Secondary | ICD-10-CM | POA: Diagnosis not present

## 2014-04-07 DIAGNOSIS — J449 Chronic obstructive pulmonary disease, unspecified: Secondary | ICD-10-CM | POA: Diagnosis not present

## 2014-04-07 DIAGNOSIS — Z87891 Personal history of nicotine dependence: Secondary | ICD-10-CM | POA: Diagnosis not present

## 2014-04-07 DIAGNOSIS — Z79899 Other long term (current) drug therapy: Secondary | ICD-10-CM | POA: Diagnosis not present

## 2014-04-07 LAB — COMPREHENSIVE METABOLIC PANEL
ALBUMIN: 3.5 g/dL (ref 3.4–5.0)
ALT: 19 U/L
AST: 16 U/L (ref 15–37)
Alkaline Phosphatase: 105 U/L
Anion Gap: 8 (ref 7–16)
BILIRUBIN TOTAL: 0.4 mg/dL (ref 0.2–1.0)
BUN: 30 mg/dL — ABNORMAL HIGH (ref 7–18)
CO2: 26 mmol/L (ref 21–32)
Calcium, Total: 8.9 mg/dL (ref 8.5–10.1)
Chloride: 108 mmol/L — ABNORMAL HIGH (ref 98–107)
Creatinine: 1.22 mg/dL (ref 0.60–1.30)
EGFR (African American): 54 — ABNORMAL LOW
GFR CALC NON AF AMER: 45 — AB
Glucose: 93 mg/dL (ref 65–99)
Osmolality: 289 (ref 275–301)
Potassium: 4 mmol/L (ref 3.5–5.1)
Sodium: 142 mmol/L (ref 136–145)
Total Protein: 6.4 g/dL (ref 6.4–8.2)

## 2014-04-07 LAB — CBC WITH DIFFERENTIAL/PLATELET
BASOS ABS: 0 10*3/uL (ref 0.0–0.1)
Basophil %: 0.6 %
Eosinophil #: 0.1 10*3/uL (ref 0.0–0.7)
Eosinophil %: 1.4 %
HCT: 39.4 % (ref 35.0–47.0)
HGB: 12.3 g/dL (ref 12.0–16.0)
LYMPHS ABS: 1.6 10*3/uL (ref 1.0–3.6)
LYMPHS PCT: 26.7 %
MCH: 29.1 pg (ref 26.0–34.0)
MCHC: 31.2 g/dL — AB (ref 32.0–36.0)
MCV: 93 fL (ref 80–100)
Monocyte #: 0.4 x10 3/mm (ref 0.2–0.9)
Monocyte %: 6.4 %
NEUTROS PCT: 64.9 %
Neutrophil #: 3.9 10*3/uL (ref 1.4–6.5)
Platelet: 198 10*3/uL (ref 150–440)
RBC: 4.23 10*6/uL (ref 3.80–5.20)
RDW: 14 % (ref 11.5–14.5)
WBC: 6 10*3/uL (ref 3.6–11.0)

## 2014-04-08 DIAGNOSIS — I10 Essential (primary) hypertension: Secondary | ICD-10-CM | POA: Diagnosis not present

## 2014-04-08 DIAGNOSIS — I4891 Unspecified atrial fibrillation: Secondary | ICD-10-CM | POA: Diagnosis not present

## 2014-04-10 ENCOUNTER — Ambulatory Visit: Payer: Self-pay | Admitting: Surgery

## 2014-04-10 DIAGNOSIS — Z87442 Personal history of urinary calculi: Secondary | ICD-10-CM | POA: Diagnosis not present

## 2014-04-10 DIAGNOSIS — H9193 Unspecified hearing loss, bilateral: Secondary | ICD-10-CM | POA: Diagnosis not present

## 2014-04-10 DIAGNOSIS — Z87891 Personal history of nicotine dependence: Secondary | ICD-10-CM | POA: Diagnosis not present

## 2014-04-10 DIAGNOSIS — Z881 Allergy status to other antibiotic agents status: Secondary | ICD-10-CM | POA: Diagnosis not present

## 2014-04-10 DIAGNOSIS — R413 Other amnesia: Secondary | ICD-10-CM | POA: Diagnosis not present

## 2014-04-10 DIAGNOSIS — Z79899 Other long term (current) drug therapy: Secondary | ICD-10-CM | POA: Diagnosis not present

## 2014-04-10 DIAGNOSIS — M159 Polyosteoarthritis, unspecified: Secondary | ICD-10-CM | POA: Diagnosis not present

## 2014-04-10 DIAGNOSIS — K648 Other hemorrhoids: Secondary | ICD-10-CM | POA: Diagnosis not present

## 2014-04-10 DIAGNOSIS — Z882 Allergy status to sulfonamides status: Secondary | ICD-10-CM | POA: Diagnosis not present

## 2014-04-10 DIAGNOSIS — J439 Emphysema, unspecified: Secondary | ICD-10-CM | POA: Diagnosis not present

## 2014-04-10 DIAGNOSIS — H3530 Unspecified macular degeneration: Secondary | ICD-10-CM | POA: Diagnosis not present

## 2014-04-10 DIAGNOSIS — D128 Benign neoplasm of rectum: Secondary | ICD-10-CM | POA: Diagnosis not present

## 2014-04-10 DIAGNOSIS — I10 Essential (primary) hypertension: Secondary | ICD-10-CM | POA: Diagnosis not present

## 2014-04-10 DIAGNOSIS — K649 Unspecified hemorrhoids: Secondary | ICD-10-CM | POA: Diagnosis not present

## 2014-04-10 DIAGNOSIS — K621 Rectal polyp: Secondary | ICD-10-CM | POA: Diagnosis not present

## 2014-04-10 DIAGNOSIS — K449 Diaphragmatic hernia without obstruction or gangrene: Secondary | ICD-10-CM | POA: Diagnosis not present

## 2014-04-10 DIAGNOSIS — I4891 Unspecified atrial fibrillation: Secondary | ICD-10-CM | POA: Diagnosis not present

## 2014-04-12 LAB — PATHOLOGY REPORT

## 2014-04-24 DIAGNOSIS — J42 Unspecified chronic bronchitis: Secondary | ICD-10-CM | POA: Diagnosis not present

## 2014-06-12 DIAGNOSIS — M1990 Unspecified osteoarthritis, unspecified site: Secondary | ICD-10-CM | POA: Diagnosis not present

## 2014-06-12 DIAGNOSIS — R630 Anorexia: Secondary | ICD-10-CM | POA: Diagnosis not present

## 2014-06-12 DIAGNOSIS — I10 Essential (primary) hypertension: Secondary | ICD-10-CM | POA: Diagnosis not present

## 2014-06-12 DIAGNOSIS — M810 Age-related osteoporosis without current pathological fracture: Secondary | ICD-10-CM | POA: Diagnosis not present

## 2014-06-12 DIAGNOSIS — Z23 Encounter for immunization: Secondary | ICD-10-CM | POA: Diagnosis not present

## 2014-07-14 DIAGNOSIS — M1990 Unspecified osteoarthritis, unspecified site: Secondary | ICD-10-CM | POA: Diagnosis not present

## 2014-07-14 DIAGNOSIS — R630 Anorexia: Secondary | ICD-10-CM | POA: Diagnosis not present

## 2014-07-14 DIAGNOSIS — Z23 Encounter for immunization: Secondary | ICD-10-CM | POA: Diagnosis not present

## 2014-07-14 DIAGNOSIS — I10 Essential (primary) hypertension: Secondary | ICD-10-CM | POA: Diagnosis not present

## 2014-07-14 DIAGNOSIS — M810 Age-related osteoporosis without current pathological fracture: Secondary | ICD-10-CM | POA: Diagnosis not present

## 2014-07-28 DIAGNOSIS — K648 Other hemorrhoids: Secondary | ICD-10-CM | POA: Diagnosis not present

## 2014-07-28 DIAGNOSIS — Z8719 Personal history of other diseases of the digestive system: Secondary | ICD-10-CM | POA: Diagnosis not present

## 2014-09-04 DIAGNOSIS — K219 Gastro-esophageal reflux disease without esophagitis: Secondary | ICD-10-CM | POA: Diagnosis not present

## 2014-09-04 DIAGNOSIS — R5383 Other fatigue: Secondary | ICD-10-CM | POA: Diagnosis not present

## 2014-09-04 DIAGNOSIS — Z23 Encounter for immunization: Secondary | ICD-10-CM | POA: Diagnosis not present

## 2014-09-04 DIAGNOSIS — I10 Essential (primary) hypertension: Secondary | ICD-10-CM | POA: Diagnosis not present

## 2014-09-04 DIAGNOSIS — M810 Age-related osteoporosis without current pathological fracture: Secondary | ICD-10-CM | POA: Diagnosis not present

## 2014-09-04 LAB — HEPATIC FUNCTION PANEL
ALT: 10 U/L (ref 7–35)
AST: 13 U/L (ref 13–35)

## 2014-09-04 LAB — BASIC METABOLIC PANEL
BUN: 39 mg/dL — AB (ref 4–21)
Creatinine: 1.2 mg/dL — AB (ref <=1.1)
Glucose: 64 mg/dL
Potassium: 4.2 mmol/L (ref 3.4–5.3)
Sodium: 142 mmol/L (ref 137–147)

## 2014-09-04 LAB — CBC AND DIFFERENTIAL
HEMATOCRIT: 40 % (ref 36–46)
HEMOGLOBIN: 12.9 g/dL (ref 12.0–16.0)
Platelets: 248 10*3/uL (ref 150–399)
WBC: 8.9 10^3/mL

## 2014-09-04 LAB — TSH: TSH: 2.02 u[IU]/mL (ref <=5.90)

## 2014-09-09 DIAGNOSIS — K22710 Barrett's esophagus with low grade dysplasia: Secondary | ICD-10-CM | POA: Diagnosis not present

## 2014-09-29 ENCOUNTER — Ambulatory Visit: Payer: Self-pay | Admitting: Unknown Physician Specialty

## 2014-09-29 DIAGNOSIS — Z881 Allergy status to other antibiotic agents status: Secondary | ICD-10-CM | POA: Diagnosis not present

## 2014-09-29 DIAGNOSIS — I10 Essential (primary) hypertension: Secondary | ICD-10-CM | POA: Diagnosis not present

## 2014-09-29 DIAGNOSIS — K2270 Barrett's esophagus without dysplasia: Secondary | ICD-10-CM | POA: Diagnosis not present

## 2014-09-29 DIAGNOSIS — Z882 Allergy status to sulfonamides status: Secondary | ICD-10-CM | POA: Diagnosis not present

## 2014-09-29 DIAGNOSIS — K210 Gastro-esophageal reflux disease with esophagitis: Secondary | ICD-10-CM | POA: Diagnosis not present

## 2014-09-29 DIAGNOSIS — Z87891 Personal history of nicotine dependence: Secondary | ICD-10-CM | POA: Diagnosis not present

## 2014-09-29 DIAGNOSIS — K449 Diaphragmatic hernia without obstruction or gangrene: Secondary | ICD-10-CM | POA: Diagnosis not present

## 2014-09-29 DIAGNOSIS — K529 Noninfective gastroenteritis and colitis, unspecified: Secondary | ICD-10-CM | POA: Diagnosis not present

## 2014-09-29 DIAGNOSIS — J449 Chronic obstructive pulmonary disease, unspecified: Secondary | ICD-10-CM | POA: Diagnosis not present

## 2014-10-31 NOTE — Op Note (Signed)
PATIENT NAME:  [name] [name], [name] [name] MR#:  [id number] DATE OF BIRTH:  18-Apr-1930  DATE OF PROCEDURE:  04/23/2013  PREOPERATIVE DIAGNOSIS: Visually significant cataract of the right eye.   POSTOPERATIVE DIAGNOSIS: Visually significant cataract of the right eye.   OPERATIVE PROCEDURE: Cataract extraction by phacoemulsification with implant of intraocular lens to right eye.   SURGEON: Birder Robson, MD.   ANESTHESIA:  1. Managed anesthesia care.  2. Topical tetracaine drops followed by 2% Xylocaine jelly applied in the preoperative holding area.   COMPLICATIONS: None.   TECHNIQUE:  Stop and chop.  DESCRIPTION OF PROCEDURE: The patient was examined and consented in the preoperative holding area where the aforementioned topical anesthesia was applied to the right eye and then brought back to the Operating Room where the right eye was prepped and draped in the usual sterile ophthalmic fashion and a lid speculum was placed. A paracentesis was created with the side port blade and the anterior chamber was filled with viscoelastic. A near clear corneal incision was performed with the steel keratome. A continuous curvilinear capsulorrhexis was performed with a cystotome followed by the capsulorrhexis forceps. Hydrodissection and hydrodelineation were carried out with BSS on a blunt cannula. The lens was removed in a stop and chop technique and the remaining cortical material was removed with the irrigation-aspiration handpiece. The capsular bag was inflated with viscoelastic and the Tecnis ZCB00 23.0-diopter lens, serial number [id number] was placed in the capsular bag without complication. The remaining viscoelastic was removed from the eye with the irrigation-aspiration handpiece. The wounds were hydrated. The anterior chamber was flushed with Miostat and the eye was inflated to physiologic pressure. 0.1 mL of cefuroxime concentration 10 mg/mL was placed in the anterior chamber. The wounds were found to be  water tight. The eye was dressed with Vigamox. The patient was given protective glasses to wear throughout the day and a shield with which to sleep tonight. The patient was also given drops with which to begin a drop regimen today and will follow-up with me in one day.     ____________________________ Livingston Diones. Bradford Cazier, MD wlp:ms D: 04/23/2013 16:58:38 ET T: 04/23/2013 18:44:48 ET JOB#: [id number]  cc: Naylin Burkle L. Brytni Dray, MD, <Dictator> Livingston Diones Sha Amer MD ELECTRONICALLY SIGNED 04/29/2013 13:19

## 2014-10-31 NOTE — Op Note (Signed)
PATIENT NAME:  [name] [name], [name] [name] MR#:  [id number] DATE OF BIRTH:  April 07, 1930  DATE OF PROCEDURE:  06/18/2013  PREOPERATIVE DIAGNOSIS: Visually significant cataract of the left eye.   POSTOPERATIVE DIAGNOSIS: Visually significant cataract of the left eye.   OPERATIVE PROCEDURE: Cataract extraction by phacoemulsification with implant of intraocular lens to the left eye.   SURGEON: Birder Robson, MD.   ANESTHESIA:  1. Managed anesthesia care.  2. Topical tetracaine drops followed by 2% Xylocaine jelly applied in the preoperative holding area.   COMPLICATIONS: None.   TECHNIQUE:  Stop and chop.   DESCRIPTION OF PROCEDURE: The patient was examined and consented in the preoperative holding area where the aforementioned topical anesthesia was applied to the left eye and then brought back to the Operating Room where the left eye was prepped and draped in the usual sterile ophthalmic fashion and a lid speculum was placed. A paracentesis was created with the side port blade and the anterior chamber was filled with viscoelastic. A near clear corneal incision was performed with the steel keratome. A continuous curvilinear capsulorrhexis was performed with a cystotome followed by the capsulorrhexis forceps. Hydrodissection and hydrodelineation were carried out with BSS on a blunt cannula. The lens was removed in a stop and chop technique and the remaining cortical material was removed with the irrigation-aspiration handpiece. The capsular bag was inflated with viscoelastic and the Tecnis ZCB00 22.5-diopter lens, serial number [id number] was placed in the capsular bag without complication. The remaining viscoelastic was removed from the eye with the irrigation-aspiration handpiece. The wounds were hydrated. The anterior chamber was flushed with Miostat and the eye was inflated to physiologic pressure. 0.1 mL of cefuroxime concentration 10 mg/mL was placed in the anterior chamber. The wounds were found to be  water tight. The eye was dressed with Vigamox. The patient was given protective glasses to wear throughout the day and a shield with which to sleep tonight. The patient was also given drops with which to begin a drop regimen today and will follow-up with me in one day.   ____________________________ Livingston Diones. Aliveah Gallant, MD wlp:gb D: 06/18/2013 22:10:44 ET T: 06/18/2013 22:48:13 ET JOB#: [id number]  cc: Ayva Veilleux L. Xcaret Morad, MD, <Dictator> Livingston Diones Letasha Kershaw MD ELECTRONICALLY SIGNED 06/19/2013 12:08

## 2014-11-01 NOTE — Consult Note (Signed)
PATIENT NAME:  [name] [name], [name] [name] MR#:  [id number] DATE OF BIRTH:  26-Feb-1930  DATE OF CONSULTATION:  11/13/2013  REFERRING PHYSICIAN:   Max Sane, MD CONSULTING PHYSICIAN:  Arther Dames, MD  REASON FOR THE CONSULTATION:  Diarrhea.   HISTORY OF PRESENT ILLNESS:  [name] [name] is an 81 year old female with a past medical history notable for COPD, brain aneurysm, who presented to the Emergency Room for evaluation of diarrhea and dehydration. The diarrhea started on approximately Thursday of last week. She was moving her bowels approximately every 2 hours for several days. She also started feeling very weak and fatigued. Over the past 2 days, she reports that her frequency of stools has decreased dramatically.   Of note, 3 days before she developed the diarrhea, she did have a colonoscopy. The colonoscopy was for epigastric abdominal pain and generalized abdominal pain. There was a large rectal villous-appearing polyp. She was actually scheduled to have surgery on May 5th before she developed the dehydration and the diarrhea.   In the interim, [name] [name] had stool studies that did come back positive for Aeromonas. She has been started on ceftriaxone.   PAST MEDICAL HISTORY: 1.  Hypertension.  2.  Osteoporosis.  3.  History of stable brain aneurysm.  4.  Chronic obstructive pulmonary disease.  5.  GERD.   HOME MEDICATIONS: 1.  Hyzaar 50/12.5 daily.  2.  Advair 250/50, 1 puff twice a day.  3.  Omeprazole 20 mg daily.  4.  Lomotil.  5.  Calcium.   ALLERGIES: SULFA AND CIPROFLOXACIN.   FAMILY HISTORY: No family history of GI malignancy that she is aware of.   SOCIAL HISTORY: She denies any smoking or any alcohol.   REVIEW OF SYSTEMS:  A 10 system review was conducted. It is negative except as stated in the HPI.   PHYSICAL EXAMINATION: VITAL SIGNS: Her current temperature is 97.5. Pulse is 93. Respirations are 17, blood pressure 104/60. Pulse ox is 95% on room air.  GENERAL: Alert  and oriented times 4.  No acute distress. Appears stated age. HEENT: Normocephalic/atraumatic. Extraocular movements are intact. Anicteric. NECK: Soft, supple. JVP appears normal. No adenopathy. CHEST: Clear to auscultation. No wheeze or crackle. Respirations unlabored. HEART: Regular. No murmur, rub, or gallop.  Normal S1 and S2. ABDOMEN: Soft, nondistended.  Mild diffuse tenderness to palpation.  Normoactive bowel sounds in all 4 quadrants.  No organomegaly. No masses.  EXTREMITIES: No swelling, well perfused. SKIN: No rash or lesion. Skin color, texture, turgor normal. NEUROLOGICAL: Grossly intact. PSYCHIATRIC: Normal tone and affect. MUSCULOSKELETAL: No joint swelling or erythema.   LABORATORY DATA:  Current sodium 142, potassium 3.5, BUN 36, creatinine 4.4. Her liver enzymes are normal, except for an albumin of 2.8. TSH normal.  White count 5.4, hemoglobin 11, hematocrit 35. Platelets are 182. Her stool studies are positive for Aeromonas. C. diff is negative.   ASSESSMENT AND PLAN: 1.  Diarrhea: The likely etiology for this was the Aeromonas. I did explain to the patient I think it is unlikely that she got the Aeromonas from the colonoscopy. I think this is a coincidence that she developed Aeromonas several days after the colonoscopy.   She does seem to be improving in terms of her diarrhea. It was extremely frequent and she tells me that she only has moved her bowels twice yesterday and once today. I think this overall is a very good sign. I do think it is reasonable to treat empirically with antibiotics, although I think it  would also continue to resolve on its own without antibiotics. Ceftriaxone has been started and this should have good reasonable coverage for the Aeromonas.   She will need to have her surgical procedure rescheduled.   I will continue to follow. Thank you for this consult.   ____________________________ Arther Dames, MD mr:dmm D: 11/13/2013 16:22:30  ET T: 11/13/2013 19:28:06 ET JOB#: [id number]  cc: Arther Dames, MD, <Dictator> Mellody Life MD ELECTRONICALLY SIGNED 11/30/2013 19:15

## 2014-11-01 NOTE — Consult Note (Signed)
Chief Complaint:  Subjective/Chief Complaint C/O room being cold. One loose stool this AM. Stool studies neg so far.   VITAL SIGNS/ANCILLARY NOTES: **Vital Signs.:   29-May-15 08:00  Temperature Temperature (F) 97.8  Celsius 36.5  Pulse Pulse 82  Respirations Respirations 20  Systolic BP Systolic BP 134  Diastolic BP (mmHg) Diastolic BP (mmHg) 75  Mean BP 94  Pulse Ox % Pulse Ox % 94  Oxygen Delivery 3L; Nasal Cannula   Brief Assessment:  GEN no acute distress   Cardiac Regular   Respiratory clear BS   Gastrointestinal Normal   Lab Results:  Hepatic:  28-May-15 04:41   Bilirubin, Total 0.3  Alkaline Phosphatase 84 (45-117 NOTE: New Reference Range 05/31/13)  SGPT (ALT)  8  SGOT (AST) 27  Total Protein, Serum  5.2  Albumin, Serum  2.1  Routine Chem:  28-May-15 04:41   Glucose, Serum  104  BUN  27  Creatinine (comp)  1.82  Sodium, Serum  147  Potassium, Serum  2.6  Chloride, Serum  109  CO2, Serum 30  Calcium (Total), Serum  7.7  Osmolality (calc) 298  eGFR (African American)  30  eGFR (Non-African American)  26 (eGFR values <60mL/min/1.73 m2 may be an indication of chronic kidney disease (CKD). Calculated eGFR is useful in patients with stable renal function. The eGFR calculation will not be reliable in acutely ill patients when serum creatinine is changing rapidly. It is not useful in  patients on dialysis. The eGFR calculation may not be applicable to patients at the low and high extremes of body sizes, pregnant women, and vegetarians.)  Anion Gap 8  Routine Hem:  28-May-15 04:41   WBC (CBC) 10.1  RBC (CBC)  3.34  Hemoglobin (CBC)  10.0  Hematocrit (CBC)  31.4  Platelet Count (CBC) 415  MCV 94  MCH 29.8  MCHC  31.7  RDW  15.0  Neutrophil % 73.5  Lymphocyte % 17.6  Monocyte % 6.4  Eosinophil % 1.8  Basophil % 0.7  Neutrophil #  7.4  Lymphocyte # 1.8  Monocyte # 0.7  Eosinophil # 0.2  Basophil # 0.1 (Result(s) reported on 05 Dec 2013 at  05:53AM.)   Assessment/Plan:  Assessment/Plan:  Assessment Chronic diarrhea x 6 months. Etiology unclear. Had aeromonas last time. Neg so far this time. Large benign rectal polyp that will need to be removed surgically later. EGD also showed Barrett's esophagus with low grade dysplasia. Pt has f/u with Dr. Elliott's office regarding repeat EGD in several months.   Plan Keep electrolyts stable. Replete K. If all stool studies neg, can try antidiarrheal agents. No mention of random colon biopsies being done on 4/15 colonoscopy to check for microscopic colitis. Pt also should be considered for Barryx procedure if patient continues to have low grade dysplasia. Consider trying antidiarrheal agents if all stool studies return negative. Will have Dr. Skulskie check on patient over the weekend. thanks.   Electronic Signatures: ,  (MD)  (Signed 29-May-15 11:52)  Authored: Chief Complaint, VITAL SIGNS/ANCILLARY NOTES, Brief Assessment, Lab Results, Assessment/Plan   Last Updated: 29-May-15 11:52 by ,  (MD) 

## 2014-11-01 NOTE — Consult Note (Signed)
Details:   - GI Note:  Diarrhea resolved.  Creat improved today and making urine.  Agree with 3 - 5 days of total abx for the aeromonas.    I will sign off. Please call with questions.   Electronic Signatures: Arther Dames (MD)  (Signed 07-May-15 17:24)  Authored: Details   Last Updated: 07-May-15 17:24 by Arther Dames (MD)

## 2014-11-01 NOTE — Consult Note (Signed)
Chief Complaint:  Subjective/Chief Complaint patient seen and examined, chart reviewed.  Multiple hospitalizations for diarrhea/anorexia/upper abdominal pain.  Paitent with weight loss over 6 months and change of bowel habits over that time period.   REcent egd and colonoscopy showing adenomatous polyps in the colon and low grade dysplasia in the distal esophagus, no colon mucosal biopsies.  Note grossly normal second portion of the duodenum.  Treated 2 weeks ago for aeromonas enteritis, currently stool studies are negative.  loose stools worsened after colonoscopy  (history c/w areomonal finding) however at least 5 months prior with some progressive change of bowel habits.  Labs indicate poor nutritional status with prealbumin at 6 and protien at 5.0.   Concern for malabsorbtive process such as pancreatic maldigestion or celiac sprue.   Recent abdominal US with only partial pancreatic evaluation. Currently denies nausea, one small yellow stool this am. Denies blood or black stools.   VITAL SIGNS/ANCILLARY NOTES: **Vital Signs.:   30-May-15 11:30  Vital Signs Type Routine  Temperature Temperature (F) 97.8  Temperature Source oral  Pulse Pulse 73  Respirations Respirations 18  Systolic BP Systolic BP 119  Diastolic BP (mmHg) Diastolic BP (mmHg) 68  Mean BP 82  Pulse Ox % Pulse Ox % 94  Pulse Ox Activity Level  At rest  Oxygen Delivery 2L   Brief Assessment:  Cardiac Regular   Respiratory clear BS   Gastrointestinal details normal Soft  Nontender  Nondistended  Bowel sounds normal   Lab Results: Hepatic:  27-May-15 14:18   Total Protein, Serum  5.7  Albumin, Serum  2.2  28-May-15 04:41   Total Protein, Serum  5.2  Albumin, Serum  2.1  29-May-15 06:23   Total Protein, Serum  5.0  Albumin, Serum  1.9  Routine Micro:  28-May-15 13:39   Micro Text Report STOOL COMPREHENSIVE   COMMENT                   HOLDING FOR POSSIBLE PATHOGEN   COMMENT                   NO PATHOGENIC E.COLI  DETECTED   COMMENT                   NO CAMPYLOBACTER ANTIGEN DETECTED   ANTIBIOTIC                        Micro Text Report CLOSTRIDIUM DIFFICILE   C.DIFFICILE ANTIGEN       C.DIFFICILE GDH ANTIGEN : NEGATIVE   C.DIFFICILE TOXIN A/B     C.DIFFICILE TOXINS A AND B : NEGATIVE   INTERPRETATION            Negative for C. difficile.    ANTIBIOTIC                        Culture Comment HOLDING FOR POSSIBLE PATHOGEN  Culture Comment . NO PATHOGENIC E.COLI DETECTED  Culture Comment    . NO CAMPYLOBACTER ANTIGEN DETECTED  Result(s) reported on 06 Dec 2013 at 10:16AM.  General Ref:  27-May-15 19:18   Prealbumin ========== TEST NAME ==========  ========= RESULTS =========  = REFERENCE RANGE =  PREALBUMIN  Prealbumin Prealbumin                      [L  6 mg/dL              ]  [facility]            No: [id number]           [address] [place], [place] [address]           Lindon Romp, MD         [phone]   Result(s) reported on 05 Dec 2013 at 04:48AM.  Routine Chem:  30-May-15 04:52   Glucose, Serum 95  BUN  19  Creatinine (comp)  1.48  Sodium, Serum 145  Potassium, Serum 3.7  Chloride, Serum  108  CO2, Serum 31  Calcium (Total), Serum 8.5  Anion Gap  6  Osmolality (calc) 291  eGFR (African American)  38  eGFR (Non-African American)  33 (eGFR values <47m/min/1.73 m2 may be an indication of chronic kidney disease (CKD). Calculated eGFR is useful in patients with stable renal function. The eGFR calculation will not be reliable in acutely ill patients when serum creatinine is changing rapidly. It is not useful in  patients on dialysis. The eGFR calculation may not be applicable to patients at the low and high extremes of body sizes, pregnant women, and vegetarians.)   Radiology Results: Cardiology:    27-May-15 13:05, ECG  ECG interpretation   Atrial fibrillation with rapid ventricular response with premature  ventricular or aberrantly conducted complexes  Moderate voltage criteria for LVH, may be normal variant  Possible Inferior infarct , age undetermined  Marked ST abnormality, possible lateral subendocardial injury  Abnormal ECG  When compared with ECG of 24-May-1994 15:19,  Significant changes have occurred  ----------unconfirmed----------  Confirmed by OVERREAD, NOT ([id number]), editor PEARSON, BARBARA ([id number]) on 12/05/2013 1:25:03 PM    27-May-15 13:35, ED ECG  ECG interpretation   Sinus tachycardia with Premature atrial complexes  Left ventricular hypertrophy with repolarization abnormality  Inferior infarct (cited on or before 04-Dec-2013)  Abnormal ECG  When compared with ECG of 04-Dec-2013 13:05,  Sinus rhythm has replaced Atrial fibrillation  Vent. rate has decreased BY  69 BPM  Serial changes of Inferior infarct Present  ----------unconfirmed----------  Confirmed by OVERREAD, NOT ([id number]), editor PEARSON, BARBARA ([id number] on 12/05/2013 1:25:07 PM    28-May-15 09:11, Echo Doppler  Echo Doppler   REASON FOR EXAM:      COMMENTS:       PROCEDURE: ECuthbert- ECHO DOPPLER COMPLETE(TRANSTHOR)  - Dec 05 2013  9:11AM     RESULT: Echocardiogram Report    [facility]   [facility] Date of Exam: 12/05/2013  Medical Rec #:  [id number]:  Date of Birth:  7May 14, 1931              Height:       63.0 in  Patient Age:    89years               Weight:       130.0 lb  Patient Gender: F                      BSA:          1.61 m??    Indications: Atrial Fib  Sonographer:    JSonia SideHegeRDCS  Referring Phys: VEther Griffins [place]   Sonographer Comments: No parasternal window.    Summary:   1. Left ventricular ejection fraction, by visual estimation, is 55 to   60%.   2. Mild mitral valve regurgitation.  3. Mild tricuspid regurgitation.  2D AND M-MODE MEASUREMENTS (normal ranges within parentheses):  Left Ventricle:          Normal  IVSd (2D):      1.11 cm (0.7-1.1)  LVPWd (2D):     0.82 cm (0.7-1.1)  Aorta/LA:                  Normal  LVIDd (2D):     3.66 cm (3.4-5.7) Left Atrium (2D): 2.40 cm (1.9-4.0)  LVIDs (2D):     2.52 cm  LV FS (2D):     31.0 %   (>25%)  LV EF (2D):     59.6 %   (>50%)   Right Ventricle:                                    RVd (2D):        3.03 cm  LV DIASTOLIC FUNCTION:  MV Peak E: 0.72 m/s E/e' Ratio: 10.30  MV Peak A: 1.07 m/s Decel Time: 348 msec  E/A Ratio: 0.67  SPECTRAL DOPPLER ANALYSIS (where applicable):  Mitral Valve:  MV P1/2 Time: 100.92 msec  MV Area, PHT: 2.18 cm??  Aortic Valve: AoV Max Vel: 1.16 m/s AoV Peak PG: 5.3 mmHg AoV Mean PG:  LVOTVmax: 1.08 m/s LVOT VTI:  LVOT Diameter: 2.10 cm  AoV Area, Vmax: 3.24 cm?? AoV Area, VTI:  AoV Area, Vmn:  Tricuspid Valve and PA/RV Systolic Pressure: TR Max Velocity: 2.36 m/s RA     Pressure: 5 mmHg RVSP/PASP: 27.3 mmHg    PHYSICIAN INTERPRETATION:  Left Ventricle: The left ventricular internal cavity size was normal. LV   posterior wall thickness was normal. No left ventricular hypertrophy.   Left ventricular ejection fraction, by visual estimation, is 55 to 60%.   Spectral Doppler shows normal pattern of LV diastolic filling.  Right Ventricle: The right ventricular size is normal.  Left Atrium: The left atrium is normal in size.  Right Atrium: The right atrium is normal in size.  Pericardium: There is no evidence of pericardial effusion.  Mitral Valve: Mild mitral valve regurgitation is seen.  Tricuspid Valve: Mild tricuspid regurgitation is visualized. The   tricuspid regurgitant velocity is 2.36 m/s, and with an assumed right   atrial pressure of 5 mmHg, the estimated right ventricular systolic     pressure is normal at 27.3 mmHg.  Aortic Valve: The aortic valve is normal.  Aorta: The aortic root is normal in size and structure.    [id number] Alexander Paraschos MD  Electronically signed by [id number] Alexander Paraschos MD  Signature Date/Time: 12/05/2013/12:26:08 PM    *** Final ***    IMPRESSION:  .        Verified By: ALEXANDER . PARASCHOS, M.D., MD  Nuclear Med:    28-May-15 13:13, Hepatobiliary Image - Nuc Med  Hepatobiliary Image - Nuc Med   REASON FOR EXAM:    gallstones, constant nausea, uppeer abdominal pain   after meals, r/o gallbladder  COMMENTS:       PROCEDURE: NM  - NM HEPATOBILIARY IMAGE  - Dec 05 2013  1:13PM     CLINICAL DATA:  Cholelithiasis an upper abdominal pain    EXAM:  NUCLEAR MEDICINE HEPATOBILIARY IMAGING    TECHNIQUE:  Sequential images of the abdomen were obtained out to 60 minutes  following intravenous administration of radiopharmaceutical.  RADIOPHARMACEUTICALS:  8.24 Millicurie Tc-99m Choletec      COMPARISON:  None.    FINDINGS:  Normal uptake is noted throughout the liver following injection.  There is visualization of the biliary tree 8 5 min with  visualization of the gallbladder at 10 min. Progressive filling of  the gallbladder is noted. Small bowelactivity is noted at 10 min.     IMPRESSION:  Normal uptake and excretion of biliary tracer.      Electronically Signed    By: Inez Catalina M.D.    On: 12/05/2013 13:30         Verified By: Everlene Farrier, M.D.,   Assessment/Plan:  Assessment/Plan:  Assessment 1) change of bowel habits with diarrhea, yellow stools.  hepatobiliary studya and lfts normal 2) low prealbumin and hypoprotienemia, hypoalbuminemia. Concern for maldigestion process.  Of note patient had a m-spike on serum protein electrophoresis on last hospitalization.   Plan 1) repeat urine and serum protien electrophoresis 2) stool for fecal elastase and fecal chymotrypsin 3) celiac panel.  4) depending on above may need to reimage abdomen/pancreas. following.   Electronic Signatures: Loistine Simas (MD)  (Signed [phone] 13:05)  Authored: Chief Complaint, VITAL SIGNS/ANCILLARY NOTES, Brief Assessment, Lab Results, Radiology Results, Assessment/Plan   Last Updated: 30-May-15 13:05 by Loistine Simas (MD)

## 2014-11-01 NOTE — Discharge Summary (Signed)
PATIENT NAME:  [name] [name], [name] [name] MR#:  [id number] DATE OF BIRTH:  10/13/1929  DATE OF ADMISSION:  11/12/2013 DATE OF DISCHARGE:  11/16/2013  DISCHARGING PHYSICIAN: Gladstone Lighter, MD  PRIMARY CARE PHYSICIAN: Dr. Rosanna Randy.  CONSULTATIONS IN THE HOSPITAL:  1.  GI consultation by Dr. Arther Dames. 2.  Nephrology consultation by Dr. Candiss Norse.  3.  ID consultation by Dr. Ola Spurr.   DISCHARGE DIAGNOSES: 1.  Aeromonas diarrhea.  2.  Acute renal failure which is prerenal and improving. 3.  Hypertension.  4.  Chronic obstructive pulmonary disease.  5.  Anorexia.  DISCHARGE HOME MEDICATIONS:  1.  Advair 250/50 one puff b.i.d.  2.  Prilosec 20 mg p.o. daily.  3.  Calcium tablets 2 tablets once a day.  4.  Percocet 5/325 mg q.6 hours p.r.n. for pain. 5.  Lomotil 0.025 mg/2.5 mg 1 tablet q.6 hours p.r.n. for diarrhea.  6.  Probiotic 1 capsule 3 times a day.  7.  Remeron 15 mg p.o. daily at bedtime.  8.  Keflex 500 mg p.o. t.i.d. for 2 days.  9.  Phenergan 12.5 mg q.6 p.r.n. for nausea, vomiting.   DISCHARGE DIET: Regular diet.   DISCHARGE ACTIVITY: As tolerated.    FOLLOWUP INSTRUCTIONS: 1.  PCP followup in one week.  2.  GI followup in 1 to 2 weeks.  3.  Advised not to take any blood pressure medicines until seen by PCP.  LABORATORIES AND IMAGING STUDIES PRIOR TO DISCHARGE: Sodium 146, potassium 3.8, chloride 122, bicarb 14, BUN 22, creatinine 1.4, glucose of 97 and calcium of 8.0.   Ultrasound of kidneys bilaterally showing minimal right renal collecting system dilatation. There might be a 1 cm calculus in the right kidney and atrophy pain left kidney with exophytic cyst noted.   Anchor panel is negative. ANA is negative. Free kappa and lambda light chains are elevated. Protein electrophoresis with mildly positive and M-spike noted. Urinalysis positive on admission.  BRIEF HOSPITAL COURSE:  [name] [name] is an 81 year old elderly Caucasian female presented to the hospital  secondary to severe diarrhea and abdominal pain. She was also noted to be in acute renal failure.  1.  Severe diarrhea, already had outpatient stool studies done which were growing Aeromonas and patient was placed on Rocephin as per ID recommendations. Her diarrhea has improved prior to discharge and she was discharged on Keflex to finish up the course after discharge. 2.  Acute renal failure likely prerenal in nature from diarrhea. However, with IV fluids, it has been improving though not quite back to normal. Since it has been improved a lot since admission and going in the right direction, nephrology recommended discharge and outpatient followup. Renal ultrasound did show some thinning and cortical atrophy of the left kidney. 3.  Hypertension due to her diarrhea and acute renal failure. Blood pressure was on the low normal side, so all her blood pressure medicines were held and she was supposed to be seeing her PCP first before starting her blood pressure medicines. 4.  Chronic obstructive pulmonary disease was stable. The patient was discharged back on her inhalers. 5.  Lack of appetite. Has been compliant. The patient had workup done to rule out cancer. However, she was advised to follow with the PCP for further workup. She is being started on Remeron at bedtime to see if it would stimulate her appetite. 6.  Her course has been otherwise uneventful in the hospital.   DISCHARGE CONDITION: Stable.   DISCHARGE DISPOSITION: Home.  TIME SPENT  ON DISCHARGE:  40 minutes.   ____________________________ Gladstone Lighter, MD rk:ce D: 11/18/2013 13:46:37 ET T: 11/18/2013 14:18:07 ET JOB#: [id number]  cc: Gladstone Lighter, MD, <Dictator> Richard L. Rosanna Randy, MD Gladstone Lighter MD ELECTRONICALLY SIGNED 11/27/2013 14:03

## 2014-11-01 NOTE — Consult Note (Signed)
Pt seen and examined. Please see K. Earle's notes. Pt with chronic diarrhea. Also, has rectal polyp that needs to be removed surgically. Was tested positive for aeromonas on last admission and then treated briefly. Diarrhea never resolved but got worse after discharge. C.diff neg. Rpeat stool study pending. Will follow. Thanks.  Electronic Signatures: Verdie Shire (MD)  (Signed on 28-May-15 21:00)  Authored  Last Updated: 28-May-15 21:00 by Verdie Shire (MD)

## 2014-11-01 NOTE — Discharge Summary (Signed)
PATIENT NAME:  [name] [name], [name] [name] MR#:  [id number] DATE OF BIRTH:  05-31-1930  DATE OF ADMISSION:  12/04/2013 DATE OF DISCHARGE:  12/09/2013  PRESENTING COMPLAINT: Acute on chronic diarrhea.   DISCHARGE DIAGNOSES: 1.  Acute on chronic diarrhea, suspected malabsorptive disorder. Still undergoing work-up and follow up with GI as outpatient.  2.  Acute renal failure and hyperkalemia due to diarrhea, resolved.  3.  Hypertension.   CODE STATUS: Full code.   DISCHARGE MEDICATIONS:  1.  Advair 250/50 puff b.i.d.  2.  Calcium with vitamin D chewable tablets two doses daily.  3.  Lomotil 1 tablet every six hours as needed for diarrhea.  4.  Lactobacillus acidophilus 1 capsule 3 times a day.  5.  Promethazine 12.5 mg every 6 hours as needed.  6.  Omeprazole 20 mg b.i.d.  7.  Norco 5/325, 1 tablet at bedtime.  8.  Ensure Clear 200 mL b.i.d.  9.  Metoprolol 25 mg b.i.d.      DISCHARGE INSTRUCTIONS:  1.  Home health physical therapy.  2.  Follow up with GI, Dr. Candace Cruise, 1 to two weeks.  3.  Follow up with primary care physician, Dr. Rosanna Randy in 2 to 3 weeks.    CONSULTANTS: Dr. Gustavo Lah, Dr. Candace Cruise.   DISCHARGE DIAGNOSTIC DATA: Platelet count at 315, creatinine 1.48, sodium is 145, potassium is 3.7. Stool comprehensive negative. Clostridium difficile negative. HIDA scan normal uptake and excretion of biliary tracer. Echo Doppler showed ejection fraction of 55% to 60%, mild mitral valve regurgitation, mild tricuspid regurgitation. White count 10.1. Urinalysis negative for urinary tract infection. TSH is 0.827. The patient's celiac sprue panel and studies for pancreatic malabsorption, along with SPEP, UPEP, are pending. Will be followed by Dr. Candace Cruise as outpatient.   BRIEF SUMMARY OF HOSPITAL COURSE: This patient is an 81 year old Caucasian female who has history of hypertension, chronic obstructive pulmonary disease, admitted with diarrhea for more than six weeks. She was admitted with:   1.  Recurrent  diarrhea. It improved over the hospital stay. She had recent aeromonas infection of the stool, and treated with ceftriaxone. C. difficile was negative. Stool cultures remained negative. The patient received IV fluids and was able to tolerate p.o. diet, solid food prior to discharge. Work-up for malabsorptive disorder send out by GI. Follow-up GI as outpatient with Dr. Candace Cruise. HIDA scan was normal.  2.  Atrial fibrillation with rapid ventricular response, due to acute illness, dehydration, hypokalemia, resolved. The patient now in sinus rhythm. Echo normal ejection fraction with  no thrombus.  3.  Acute renal failure with dehydration and chronic kidney disease stage III, back to baseline. The patient received IV fluids.  4.  Hypokalemia due to gastrointestinal losses, replaced.  5.  Hypertension on metoprolol.  5.  Chronic obstructive pulmonary disease, stable. Continued inhalers.  6.  Malnutrition, protein calorie. Ensure Clear was added. The patient was encouraged to take a high-protein diet.   Hospital stay otherwise remained stable. Home health physical therapy has been arranged. Discharge plan was discussed with the patient's daughter, Celestina Gironda.  TIME SPENT: 40 minutes.   ____________________________ Hart Rochester Posey Pronto, MD sap:cg D: 12/10/2013 06:39:17 ET T: 12/10/2013 07:40:19 ET JOB#: [id number]  cc: Mate Alegria A. Posey Pronto, MD, <Dictator> Lupita Dawn. Candace Cruise, MD Richard L. Rosanna Randy, MD Lollie Sails, MD Ilda Basset MD ELECTRONICALLY SIGNED 12/23/2013 13:23

## 2014-11-01 NOTE — H&P (Signed)
PATIENT NAME:  [name] [name], [name] [name] MR#:  [id number] DATE OF BIRTH:  Jan 28, 1930  DATE OF ADMISSION:  12/04/2013  PRIMARY CARE PHYSICIAN: Dr. Rosanna Randy.  HISTORY OF PRESENT ILLNESS:  The patient is an 81 year old Caucasian female with past medical history significant for history of recent admission to the hospital and discharged on 11/16/2013 with Aeromonas diarrhea, history of acute renal failure, Aeromonas diarrhea which was improving, history of hypertension, COPD, as well as anorexia, who presents to the hospital with complaints of altered mental status as well as diaphoresis and feeling presyncopal.  Apparently, patient was doing relatively well, however, still was having significant problems with her stool and her poor p.o. intake. Today in the morning, however, when she was sitting at home, she was noted to have altered mental status, became diaphoretic suddenly, and became presyncopal. She was brought to the Emergency Room for further evaluation where she was found to be in atrial fibrillation, RVR with a rate of 180s -200s. She was also severely hypotensive with systolic blood pressure reaching 60s and she was about to be cardioverted and she suddenly returned back to sinus rhythm. She remained somewhat tachycardic at present; however, with rehydration, the patient's blood pressure improved. She was noted to be severely hypokalemic with potassium level of 2.3 on admission to the hospital labs.  Her magnesium level was found to be relatively normal.  However, patient received magnesium during resuscitation in the Emergency Room.   PAST MEDICAL HISTORY: Significant for history of anorexia, weight loss over the past 6 months. The patient lost approximately 10-12 pounds.  Has abdominal pains, which were investigated in the past. The patient had colonoscopy as well as EGD done by a gastroenterologist, [facility]. She was recently admitted to the hospital for Aeromonas diarrhea. She had acute renal  failure which was improving, also history of hypertension, COPD as mentioned above, anorexia, osteoporosis, brain aneurysm, as well as gastroesophageal reflux disease. She was recently evaluated by Dr. Rosanna Randy,  stool cultures were taken   and found negative for Aeromonas or Clostridium difficile infection. The patient continuously having loose stools despite negative cultures and her oral intake remains unsatisfactory. She admits to having significant abdominal pain intermittently.   MEDICATIONS: According to medical records, the patient is on Advair Diskus 250/50 one puff twice daily. Atropine diphenoxylate, which is Lomotil, 0.025/2.5 mg tablets every 6 hours as needed for diarrhea. HCTZ losartan 25/100 once daily, lactobacillus 1 capsule 3 times daily, Norco 325/5 mg 1 capsule at bedtime, omeprazole 10 mg p.o. twice daily, promethazine 12.5 mg every 6 hours as needed, Viactiv soft calcium chews with 2 doses daily.   PAST SURGICAL HISTORY:  Nasal polyp, remote hernia surgery, bilateral hip surgery, left ankle surgery.  ALLERGIES:  SULFA AS WELL AS CIPROFLOXACIN.   FAMILY HISTORY: Father had coronary disease. Mother had the history of cancer.   SOCIAL HISTORY: The patient lives by herself, fairly independent.   She wears Lifeline. No smoking.  She is, however, former smoker. Alcohol occasionally.   REVIEW OF SYSTEMS:  CONSTITUTIONAL: Positive for fatigue and weakness for the past 6 months. Approximately 10 to 15 pounds weight loss in 6 months. Also, pains in the abdomen, upper abdomen. She seemed to be chronic after each meal. Bilateral cataract removal, December as well as January last year as well as this year.  Vision now is 20/15. Some wheezing in the lungs as well as shortness of breath, but she feels that it is related to her overall weakness.  Severe  nausea intermittently, also feeling presyncopal earlier today and upper abdominal pains, dry heaving intermittently.  Otherwise, denies any high  fevers, weight gain.  EYES: Denies any blurry vision, double vision, or glaucoma.  EARS, NOSE, AND THROAT:   Denies any tinnitus, allergies, epistaxis, sinus pain, dentures, or difficulty swallowing.  RESPIRATORY: Denies any cough, hemoptysis. CARDIOVASCULAR: Denies chest pains, orthopnea, edema, arrhythmias. GASTROINTESTINAL: Denies any vomiting or diarrhea.  Denies any rectal bleeding, change in bowel habits.  Admits of having ongoing diarrheal stool.  GENITOURINARY : Denies dysuria, hematuria, frequency, or incontinence.  ENDOCRINOLOGY: Denies any polydipsia, nocturia, thyroid problems, heat or cold intolerance, or thirst.  HEMATOLOGIC: Denies anemia, easy bruising, bleeding, or swollen glands.  SKIN: Denies any acne, rashes, lesions, or change in moles.  MUSCULOSKELETAL: Denies arthritis, cramps, swelling, or gout.  NEUROLOGIC: Denies numbness, epilepsy, or tremor.  PSYCHIATRIC: Denies anxiety, insomnia, or depression.  PHYSICAL EXAMINATION:  VITAL SIGNS: On arrival to the hospital, temperature was 97.8, pulse 178, respiration rate was 24, blood pressure 80/48, saturation was 91% on room air.  GENERAL: This is a well-developed, well-nourished Caucasian female in no acute distress, sitting on the stretcher. HEENT: Pupils are equal and reactive to light. Extraocular movements are intact. No icterus or conjunctivitis. Has normal hearing. No pharyngeal erythema. Mucosa is very dry.  NECK: No masses, supple, nontender. Thyroid is not enlarged. No adenopathy. No JVD or carotid bruits bilaterally. Full range of motion.  LUNGS: Clear to auscultation, though diminished breath sounds bilaterally.  No rales, rhonchi wheezing, labored inspirations, increased effort, dullness to percussion, overt respiratory distress.  CARDIOVASCULAR: S1, S2 rythm was regular.  Patient does have systolic murmur heard, 2/6. PMI is not lateralized. Chest is nontender to palpation.  EXTREMITIES: 1+ pedal pulses. No lower  extremity edema, calf tenderness, or cyanosis.  ABDOMEN: Soft, no significant tenderness. No hepatosplenomegaly or masses were noted.  RECTAL:  Deferred. MUSCLE STRENGTH: Able to move all extremities. No cyanosis, degenerative joint disease, or kyphosis. Gait not tested.   SKIN:  Did not reveal any rashes, lesions, erythema, nodularity, or induration. It was warm and dry to palpation.  LYMPHATIC: No adenopathy in the cervical region.  NEUROLOGICAL: Cranial nerves grossly intact. Sensory is intact. Patient does have some dysarthria due to dry mouth. The patient is alert and oriented to time, person, and place. Cooperative. Memory is good.  PSYCHIATRIC: No significant confusion, agitation, or depression noted.   LABORATORY DATA:  BMP shows glucose of 113, BUN and creatinine were 28 and 1.83, sodium 148, potassium 2.3, otherwise BMP was unremarkable. The patient's magnesium level was checked, was found to be 1.9. Liver enzymes: Albumin level of 2.2. Troponin is elevated at 0.53. The patient's white blood cell count is 10.3 on the first test. Later in the day, it was also rechecked. The patient's white blood cell count was 11.5,  hemoglobin was 11.5 on the second testing, and platelet count was 428,000.  Activated PTT less than 23. Urinalysis revealed yellow clear urine, negative for glucose, bilirubin, or ketones. Specific gravity 1.014, pH was 5.0, 1+ blood, 30 mg/dL protein, negative for nitrites or leukocyte esterase with 7 red blood cells, 1 white blood cell, 1+ bacteria, less than 1 epithelial cell. Mucus was present as well as 3 hyaline casts.   RADIOLOGIC STUDIES: Chest x-ray portable single view on 12/04/2013, showed no acute abnormality. Ultrasound of kidneys bilateral, this was on 11/13/2013 is pending .  Patient's EKG, initial EKG on arrival to Emergency Room showed atrial fibrillation with  RVR at rate of 184 beats per minute. Normal axis, moderate voltage criteria for LVH, maybe normal variant,   possible inferior infarcts, age indeterminate, marked ST abnormality. Consider lateral subendocardial injury. Repeat EKG showed again STT   depressions in lateral leads, sinus tachycardia, premature atrial complexes, LVH with repolarization abnormality, inferior infarct, age indeterminate, according to EKG criteria.   ASSESSMENT AND PLAN:  1. Atrial fibrillation, rapid ventricular response. Admit patient to the medical floor. Telemetry, started on metoprolol. The patient converted spontaneously.  We will get cardiologist involved. We will get echocardiogram done as well as a TSH.  2. Elevated troponin, likely related to rate. Patient will need to be investigated by cardiologist. She may need to undergo cardiac catheterization study. We will continue the patient on aspirin as well as metoprolol and heparin subcutaneously, following her cardiac enzymes x 3.  3. Hypertension.  Resolved with rehydration. We will continue IV fluids.  4. Dehydration due to ongoing with diarrhea as well as nausea with upper abdominal, pains. We will continue IV fluids.  5. Upper abdominal pain of unclear etiology at this time, however not worked up for gallbladder abnormalities. We will get a HIDA scan and we will get surgery involved. If this is abnormal, we will also get gastroenterology back.  6. Hypokalemia. Supplementing IV as well as p.o. We will get magnesium and potassium levels in the morning.  7. Acute renal failure. We will continue the observation of her kidney function with IV hydration.   TIME SPENT: One hour and 15  minutes.   ____________________________ Theodoro Grist, MD rv:dd D: 12/04/2013 18:37:32 ET T: 12/04/2013 19:42:54 ET JOB#: [id number]  cc: Theodoro Grist, MD, <Dictator> Richard L. Rosanna Randy, MD    Theodoro Grist MD ELECTRONICALLY SIGNED 01/04/2014 17:47

## 2014-11-01 NOTE — Consult Note (Signed)
Brief Consult Note: Diagnosis: afib, diarrhea, poor PO intake, weight loss.   Comments: Went to see patient but not in the room. reviewed chart. Had recent EGD/colonoscopy. US/HIDA pending. Stool studies pending.  Will await results of above and see patient when she is back in the room. full consult to follow.  Electronic Signatures: Sherald Barge (PA-C)  (Signed 28-May-15 13:02)  Authored: Brief Consult Note   Last Updated: 28-May-15 13:02 by Sherald Barge (PA-C)

## 2014-11-01 NOTE — Consult Note (Signed)
PATIENT NAME:  [name] [name], [name] [name] MR#:  [id number] DATE OF BIRTH:  May 14, 1930  DATE OF CONSULTATION:  11/13/2013  REFERRING PHYSICIAN:  Dr. Vianne Bulls CONSULTING PHYSICIAN:  Cheral Marker. Ola Spurr, MD  REASON FOR CONSULTATION:  Gastroenteritis.   HISTORY OF PRESENT ILLNESS:  This is a very pleasant 81 year old female previously seen in relatively good health, who was admitted after 1 week of severe diarrhea and abdominal pain. She was found to be in acute renal failure. Stool cultures have been positive for Aeromonas species. We are consulted for further antibiotic management.   The patient reports she had been having chronic abdominal pain and been following with Dr. Rochel Brome, as well as GI. Approximately 1 week ago, she had EGD and colonoscopy, which were negative. She reports, however, that she then started having severe diarrhea every 1 to 2 hours, watery in nature. There was no blood in it. She had become quite dehydrated and had decreased urine output, and in the ER on May 5th, she was found to have a creatinine of 4.37.   Since admit the patient has been treated with IV ceftriaxone and IV fluids. Her diarrhea has actually slowed down impressively. She is having no fevers, chills, or night sweats. She has minimal abdominal pain, and no nausea or vomiting. Her renal function remains quite poor, and she still has minimal urine output.   Of note, the patient does report eating shrimp several days prior to the onset of her diarrhea. This was several days prior to the colonoscopy as well.   PAST MEDICAL HISTORY:  1.  Chronic abdominal pain as above.  2.  Hypertension.  3.  Osteoporosis.  4.  Brain aneurysm.  5.  COPD.  6.  GERD.   SOCIAL HISTORY:  The patient lives alone. She does not smoke or drink. Her son is in the room and is involved in her care.   FAMILY HISTORY:  Positive for coronary artery disease. Mother with history of cancer.   PAST SURGICAL HISTORY:  Nasal polyp removed,  hernia repair, bilateral hip surgery, and left ankle surgery.   ALLERGIES:  She is allergic to SULFA, and she is listed as being allergic to CIPROFLOXACIN, but she does not know if that is a real allergy.   ANTIBIOTICS SINCE ADMISSION:  Include ceftriaxone.  OUTPATIENT MEDICATIONS:  Include Hyzaar, Advair, omeprazole, Lomotil, and calcium.   REVIEW OF SYSTEMS:  Eleven systems reviewed and negative except as per HPI.   PHYSICAL EXAMINATION:  VITAL SIGNS:  Temperature is 98.3, pulse 95, blood pressure 99/43, respirations 18, sat 93% on room air.  GENERAL:  She is pleasant, no acute distress.  HEENT:  Pupils equal, round, and reactive to light and accommodation. Extraocular movements are intact. Sclerae are anicteric. Oropharynx is clear. Mucous membranes are dry.  NECK:  Supple.  HEART:  Regular.  LUNGS:  Clear.  ABDOMEN:  Soft, nontender, nondistended. No hepatosplenomegaly. Normal bowel sounds.  EXTREMITIES:  No clubbing, cyanosis, or edema.  NEUROLOGIC:  She is alert and oriented x3. Grossly nonfocal neuro exam.   DATA:  White blood count on admission was 9.0, currently 5.4; hemoglobin 11.6; platelets 182. LFTs are normal except for low albumin at 2.8. Renal function shows a creatinine of 4.37 on May 5th, currently 4.40. BUN of 36, bicarb of 16. Stool culture is growing heavy growth of Aeromonas sobria. C. diff is negative. Urinalysis is negative.   IMAGING:  Ultrasound of her abdomen done today reveals minimal right renal collecting system dilation,  unable to exclude 11-mm right calculus, and atrophic thin left kidney.   IMPRESSION:  An 81 year old admitted with acute renal failure from several days of severe diarrhea. Stool cultures have Aeromonas. No other pathogens identified. She is already clinically improved in terms of the diarrhea and is having no fevers. She does not have an elevated white count. There is no evidence of bacteremia. Her main issue currently is her severe  dehydration and renal failure.   RECOMMENDATIONS:  1.  Continue ceftriaxone, but would only need a 3 to 5-day course, and she has already clinically improved remarkably.  2.  Continue treatment of renal failure.  3.  The patient could potentially have gotten the Aeromonas through eating shellfish, as it is an organism commonly found in brackish water. Other than that, there is no known source.   Thank you for the consult. I will be glad to follow with you.    ____________________________ Cheral Marker. Ola Spurr, MD dpf:ms D: 11/13/2013 21:40:46 ET T: 11/13/2013 23:19:38 ET JOB#: [id number]  cc: Cheral Marker. Ola Spurr, MD, <Dictator> DAVID Ola Spurr MD ELECTRONICALLY SIGNED 11/14/2013 14:12

## 2014-11-01 NOTE — Consult Note (Signed)
Chief Complaint:  Subjective/Chief Complaint seen for diarrheal illness.  ate regular diet today some and stated no problems.  no n/v or abdominal pain.  One loose stool today.   VITAL SIGNS/ANCILLARY NOTES: **Vital Signs.:   31-May-15 09:15  Vital Signs Type Routine  Temperature Temperature (F) 97.8  Celsius 36.5  Temperature Source axillary  Pulse Pulse 95  Respirations Respirations 18  Systolic BP Systolic BP 893  Diastolic BP (mmHg) Diastolic BP (mmHg) 62  Mean BP 83  Pulse Ox % Pulse Ox % 91  Pulse Ox Activity Level  At rest  Oxygen Delivery Room Air/ 21 %   Brief Assessment:  Cardiac Regular   Respiratory clear BS   Gastrointestinal details normal Soft  Nontender  Nondistended   EXTR positive cyanosis/clubbing, negative cyanosis/clubbing   Lab Results: Hepatic:  29-May-15 06:23   Total Protein, Serum  5.0  Albumin, Serum  1.9  Routine Micro:  28-May-15 13:39   Micro Text Report STOOL COMPREHENSIVE   COMMENT                   HOLDING FOR POSSIBLE PATHOGEN   COMMENT                   NO PATHOGENIC E.COLI DETECTED   COMMENT                   NO CAMPYLOBACTER ANTIGEN DETECTED   ANTIBIOTIC                        Micro Text Report CLOSTRIDIUM DIFFICILE   C.DIFFICILE ANTIGEN       C.DIFFICILE GDH ANTIGEN : NEGATIVE   C.DIFFICILE TOXIN A/B     C.DIFFICILE TOXINS A AND B : NEGATIVE   INTERPRETATION            Negative for C. difficile.    ANTIBIOTIC                        Culture Comment HOLDING FOR POSSIBLE PATHOGEN  Culture Comment . NO PATHOGENIC E.COLI DETECTED  Culture Comment    . NO CAMPYLOBACTER ANTIGEN DETECTED  Result(s) reported on 06 Dec 2013 at 10:16AM.  General Ref:  27-May-15 19:18   Prealbumin ========== TEST NAME ==========  ========= RESULTS =========  = REFERENCE RANGE =  PREALBUMIN  Prealbumin Prealbumin                      [L  6 mg/dL              ]             20-40               [name] [name]            No: [phone]            [address], [place], [place] [id number]           [name] Romp, [name] [name]         [phone]   Result(s) reported on 05 Dec 2013 at 04:48AM.  Routine Chem:  30-May-15 04:52   Glucose, Serum 95  BUN  19  Creatinine (comp)  1.48  Sodium, Serum 145  Potassium, Serum 3.7  Chloride, Serum  108  CO2, Serum 31  Calcium (Total), Serum 8.5  Anion Gap  6  Osmolality (calc) 291  eGFR (African American)  38  eGFR (Non-African American)  33 (eGFR values <94m/min/1.73 m2 may be an indication of chronic kidney disease (CKD). Calculated eGFR is useful in patients with stable renal function. The eGFR calculation will not be reliable in acutely ill patients when serum creatinine is changing rapidly. It is not useful in  patients on dialysis. The eGFR calculation may not be applicable to patients at the low and high extremes of body sizes, pregnant women, and vegetarians.)   Radiology Results: Nuclear Med:    28-May-15 13:13, Hepatobiliary Image - Nuc Med  Hepatobiliary Image - Nuc Med   REASON FOR EXAM:    gallstones, constant nausea, uppeer abdominal pain   after meals, r/o gallbladder  COMMENTS:       PROCEDURE: NM  - NM HEPATOBILIARY IMAGE  - Dec 05 2013  1:13PM     CLINICAL DATA:  Cholelithiasis an upper abdominal pain    EXAM:  NUCLEAR MEDICINE HEPATOBILIARY IMAGING    TECHNIQUE:  Sequential images of the abdomen were obtained out to 60 minutes  following intravenous administration of radiopharmaceutical.  RADIOPHARMACEUTICALS:  85.99[employer] THF-41SCholetec    COMPARISON:  None.    FINDINGS:  Normal uptake is noted throughout the liver following injection.  There is visualization of the biliary tree 8 5 min with  visualization of the gallbladder at 10 min. Progressive filling of  the gallbladder is noted. Small bowelactivity is noted at 10 min.     IMPRESSION:  Normal uptake and excretion of biliary tracer.      Electronically Signed    By: MInez Catalina M.D.    On: 12/05/2013 13:30         Verified By: MEverlene Farrier M.D.,   Assessment/Plan:  Assessment/Plan:  Assessment 1) diarrhea-etiology uncertain-in light of the poor nutritional status multiple labs ordered in regard to  maldigestion-celiac panel, pancreatic malabsorbtion.  2) history of recent abnormal protein electrophoresis with m spike.  24 hour urine and repeat spep ordered.   Plan as above, nutrition recs per dietary, will need GI o/p fu.   Electronic Signatures: SLoistine Simas([name] [name])  (Signed 31-May-15 17:47)  Authored: Chief Complaint, VITAL SIGNS/ANCILLARY NOTES, Brief Assessment, Lab Results, Radiology Results, Assessment/Plan   Last Updated: 31-May-15 17:47 by SLoistine Simas([name] [name])

## 2014-11-01 NOTE — Consult Note (Signed)
PATIENT NAME:  [name] [name], [name] [name] MR#:  [id number] DATE OF BIRTH:  May 23, 1930  DATE OF CONSULTATION:  12/05/2013  REFERRING PHYSICIAN:  Dr. Ether Griffins.  CONSULTING PHYSICIAN:  Corky Sox. Zettie Pho, PA-C/Paul Oh, MD.  ATTENDING GASTROENTEROLOGIST:  Verdie Shire, MD.    REASON FOR CONSULTATION:  Diarrhea, upper abdominal pain and nausea with very poor p.o. intake.   HISTORY OF PRESENT ILLNESS:  This is a pleasant 81 year old female accompanied by a family member who was admitted with some altered mental status, diaphoresis, atrial fibrillation in RVR. She was also hypotensive and was admitted. She is recently discharged from the hospital on 11/16/2013 with Aeromonas diarrhea. She has a history of multiple hospitalizations for poor p.o. intake, nausea and diarrhea. She underwent an EGD and a colonoscopy 10/18/2013 by Dr. Gaylyn Cheers notable for a large rectal polyp, diverticulosis, LA grade A esophagitis and a hiatal hernia. Later she was diagnosed with Aeromonas diarrhea, was consulted by Dr. Thurmond Butts and was given ceftriaxone. She responded well to this antibiotic within about 2 days and was discharged in stable condition. Because of these intermittent episodes of poor p.o. intake, she has lost about 10 to 12 pounds over the past 6 months. When she was last discharged on 11/16/2013, she had done well for several days and then symptoms soon returned. The patient's family member is highly suspicious that the reason she went into a-fib was because of her poor diet and potentially some malnutrition causing electrolyte abnormalities. Currently, the patient is still feeling nauseated but no vomiting. Very mild upper abdominal pain. She does still report some looser stools and just had a bowel movement before I came in to the exam room. Orders have been placed to check her stool for C. diff and a culture, also keeping in mind that she recently had Aeromonas diarrhea diagnosed during her last hospitalization. She also went down  for an ultrasound and a HIDA scan to evaluate her gallbladder as an etiology. She just returned to the hospital room from this exam and the results are still pending. Chest x-ray was negative for any acute abnormality. There is no shortness of breath. There are no black or bloody stools. No dysphagia.   PAST MEDICAL HISTORY:  Ongoing anorexia over he past 6 months, reflux esophagitis, hypertension, COPD, osteoporosis, a history of a brain aneurysm, GERD.   HOME MEDICATIONS:  Atropine, diphenoxylate, Lomotil, Advair Diskus, hydrochlorothiazide/losartan, lactobacillus, Norco, omeprazole, promethazine, Viactiv.   ALLERGIES:  SULFA, CIPROFLOXACIN.   PAST SURGICAL HISTORY:  Nasal polyp removal, hernia repair, left ankle surgery and bilateral hip surgery.   FAMILY HISTORY:  No known family history of GI malignancy, colon polyps or IBD though her mother has had cancer, unsure of what primary.   SOCIAL HISTORY:  The patient has a remote history of tobacco use, but denies any current tobacco use. No illicit drug use. She reports very rare occasional alcohol intake, but denies anything in excess. She lives alone at home.   REVIEW OF SYSTEMS:  A 10-system review of systems was obtained on the patient. Pertinent positives are mentioned above and otherwise negative.   OBJECTIVE:  VITAL SIGNS:  Blood pressure 147/18, heart rate 93, respirations 18, temperature 98.1, bedside pulse ox 91%.  GENERAL:  A pleasant 81 year old female resting quietly and comfortably in bed in no acute distress. Alert and oriented x 3.  HEAD:  Atraumatic, normocephalic.  NECK:  Supple. No lymphadenopathy noted.  HEENT:  Sclerae anicteric. Mucous membranes moist.  PULMONARY:  Respirations are even  and unlabored. Clear to auscultation bilateral anterior lung fields.  CARDIAC:  Regular rate and rhythm. S1, S2 noted.  ABDOMEN:  Soft, nontender, nondistended. Normoactive bowel sounds noted in all 4 quadrants. No guarding or rebound. No  masses, hernias or organomegaly appreciated.  PSYCHIATRIC:  Appropriate mood and affect.  NEUROLOGIC:  Cranial nerves II through XII are grossly intact.  RECTAL:  Deferred.  EXTREMITIES:  Negative for lower extremity edema, 2+ pulses noted in bilateral upper extremities.   LABORATORY DATA:  White blood cells 10.1, hemoglobin 10, hematocrit 31.4, platelets 415, MCV 94. Sodium 147, potassium 2.6, BUN 27, creatinine 1.82, glucose 104. LFTs are within normal limits. CPK 4.7, troponin 0.83.   IMAGING:  Chest x-ray was obtained on the patient showing no evidence of an acute abnormality.   Ultrasound and HIDA scan of the gallbladder were obtained earlier this afternoon and the results are still pending.   Stool for C. diff and culture are currently pending. The patient was just able to provide a specimen.   ASSESSMENT:  1.  Diarrhea described as looser stool. At her prior admission earlier in 11/2013, she had Aeromonas diarrhea, which responded well to ceftriaxone.  2.  Upper abdominal pain intermittently over the past 6 months.  3.  Anorexia with poor p.o. intake over the past 6 months. This is caused about a 10 to 12 pound weight loss.  4.  Nausea, this is contributing to her loss of appetite.  5.  Atrial fibrillation with rapid ventricular response at admission.  6.  Large rectal polyp noted on colonoscopy 10/18/2013 that was unable to be removed.   PLAN:  I have discussed this patient's case in detail with Dr. Verdie Shire, who is involved in the development of the patient's plan of care. At this time, we are awaiting her stool sample to check for C. difficile and culture to evaluate for underlying infectious etiologies. We do agree with evaluating her gallbladder further in regards to her nausea and lack of appetite and an ultrasound and HIDA scan were completed this afternoon. The results are still pending and we are awaiting these findings. We recommend continuing symptomatic management with  antiemetics and proton pump inhibitor therapy. Diet should be clear liquids and advanced as tolerated, going slowly. Also in regards to the large rectal polyp noted on colonoscopy in 10/2013, we will need to address this to see who can get this removed for the patient.   We will continue to monitor this patient closely and make further recommendations pending the ultrasound, HIDA and stool studies and per clinical course.   Thank you so much for this consultation and for allowing [place] to participate in the patient's plan of care.    ____________________________ Corky Sox. Bellamy Judson, PA-C kme:jm D: 12/05/2013 15:08:47 ET T: 12/05/2013 16:34:24 ET JOB#: [id number]  cc: Corky Sox. Emmilee Reamer, PA-C, <Dictator> Tift PA ELECTRONICALLY SIGNED 12/05/2013 17:13

## 2014-11-01 NOTE — Consult Note (Signed)
PATIENT NAME:  [name] [name], [name] [name] MR#:  [id number] DATE OF BIRTH:  January 24, 1930  DATE OF CONSULTATION:  12/04/2013  REFERRING PHYSICIAN: Dr.  Ether Griffins  CONSULTING PHYSICIAN:  Isaias Cowman, MD  REASON FOR CONSULTATION: Atrial fibrillation.   HISTORY OF PRESENT ILLNESS: The patient is an 81 year old female referred for evaluation of atrial fibrillation. The patient was recently hospitalized and discharged on 11/16/2013 with Aeromonas diarrhea, dehydration and acute renal failure. The patient reports since discharge she has continued to experience poor p.o. intake and recurrent diarrhea. On the day of admission, the patient became acutely diaphoretic, lightheaded, and presented to [facility] Emergency Room. In the Emergency Room, she was noted to be in atrial fibrillation with a rapid ventricular rate. The patient converted to sinus rhythm spontaneously. The patient was admitted to telemetry. Admission labs were notable for a BUN and creatinine of 28 and 1.83, respectively. Potassium was 2.3. The patient reports that she has had poor p.o. intake with recurrent diarrhea. Troponin was borderline elevated to 0.53 and peaked at 0.90 in the absence of chest pain.   PAST MEDICAL HISTORY: 1. Aeromonas diarrhea.  2. Six month history of poor p.o. intake.  3. Dehydration and acute renal failure as described above.  4. Hypertension.  5. Chronic obstructive pulmonary disease.  6. History of brain aneurysm.  7. Gastroesophageal reflux disease   MEDICATIONS: Advair Diskus 250/50 1 puff b.i.d., Lomotil 0.025/2.5 mg tablets q.6 hours p.r.n., losartan/HCTZ 100/25 one daily, Lactobacillus 1 capsule t.i.d., Norco 325/5 one capsule at bedtime, omeprazole 10 mg b.i.d., promethazine 12.5 mg q.6 p.r.n., Viactiv soft calcium chews 2 daily.   SOCIAL HISTORY: The patient is a widow. She currently lives alone. She has a remote tobacco abuse history.   FAMILY HISTORY: Father with known coronary artery disease.   REVIEW OF  SYSTEMS:  CONSTITUTIONAL: No fever or chills.  EYES: No blurry vision.  EARS: No hearing loss.  RESPIRATORY: No shortness of breath.  CARDIOVASCULAR: No chest pain.  GASTROINTESTINAL: Poor p.o. intake and recurrent diarrhea as described above.  GENITOURINARY: No dysuria or hematuria.  ENDOCRINE: No polyuria or polydipsia.  MUSCULOSKELETAL: No arthralgias or myalgias.  NEUROLOGICAL: No focal muscle weakness or numbness.  PSYCHOLOGICAL: No depression or anxiety.   PHYSICAL EXAMINATION: VITAL SIGNS: Blood pressure 130/68, pulse 75, respirations 16, temperature 97.4, pulse oximetry 92%.  HEENT: Pupils equal, reactive to light and accommodation.  NECK: Supple without thyromegaly.  LUNGS: Clear.  HEART: Normal JVP. Normal PMI. Regular rate and rhythm. Normal S1, S2. No appreciable gallop, murmur, or rub.  ABDOMEN: Soft and nontender. Pulses were intact bilaterally.  MUSCULOSKELETAL: Normal muscle tone.  NEUROLOGIC: The patient is alert and oriented x 3. Motor and sensory both grossly intact.   IMPRESSION: An 81 year old female with recent history of Aeromonas diarrhea with dehydration, acute renal failure, electrolyte disturbance with marked hypokalemia who presents with atrial fibrillation with rapid ventricular rate, which spontaneously converted to sinus rhythm. The patient has borderline elevated troponin. I suspect atrial fibrillation as well as borderline elevated troponin is due to demand supply ischemia with continued diarrhea, dehydration and acute renal failure and electrolyte abnormalities.   RECOMMENDATIONS: 1. Agree with overall current therapy.  2. The patient has a CHADS2 score of 2; however, I feel atrial fibrillation is due to reversible cause and would recommend against anticoagulation at this time, particularly in light of persistent diarrhea.  3. Continue metoprolol 25 mg b.i.d.  4. Review 2-D echocardiogram.  5. Would defer noninvasive or invasive evaluation for  borderline elevated troponin at this time. May consider outpatient functional study when the patient is clinically stable from a GI perspective.  ____________________________ Isaias Cowman, MD ap:sg D: 12/05/2013 08:57:00 ET T: 12/05/2013 09:25:02 ET JOB#: [id number]  cc: Isaias Cowman, MD, <Dictator> Isaias Cowman MD ELECTRONICALLY SIGNED 12/09/2013 15:05

## 2014-11-01 NOTE — Consult Note (Signed)
Brief Consult Note: Diagnosis: diarrhea 2/2 aeromonas.   Patient was seen by consultant.   Consult note dictated.   Comments: 1.) 1 week of diarrhea,  stool studies grow aeromonas - Her diarrhea is dramatically better.  2 stools yesterday and 1 stool today. This is normally a self limited disease and she appears to be clearing without abx.  - Abx are not necessary at this time, but if you do choose to treat with a short course,  cipro or bactrim would be reasonable choices.  Electronic Signatures: Arther Dames (MD)  (Signed 06-May-15 12:39)  Authored: Brief Consult Note   Last Updated: 06-May-15 12:39 by Arther Dames (MD)

## 2014-11-01 NOTE — H&P (Signed)
PATIENT NAME:  [name] [name], [name] [name] MR#:  [id number] DATE OF BIRTH:  Mar 16, 1930  DATE OF ADMISSION:  11/12/2013  PRIMARY CARE PHYSICIAN:  Dr. Miguel Aschoff.   GASTROINTESTINAL DOCTOR:  Dr. Gaylyn Cheers.  REQUESTING PHYSICIAN:  Dr. Graciella Freer.  CHIEF COMPLAINT:  Diarrhea.  HISTORY OF PRESENT ILLNESS:  The patient is an 81 year old female with a known history of hypertension, GERD, who is being admitted for acute renal failure and ongoing diarrhea.  The patient reports having abdominal pain on and off for the last six months for which she had a surgical evaluation by Dr. Rochel Brome for possible gallbladder disease who recommended to have a GI work-up for which she had an EGD and colonoscopy by Dr. Vira Agar last week which were negative.  Her colonoscopy was on Monday, after which on Thursday she started having severe diarrhea, just watery and was not getting better, called Dr. Percell Boston office on Friday who requested her to get a stool sample which she did get it at lab here in hospital and all her stool studies came back negative.  She called Dr. Percell Boston office this morning as she was not urinating which normally she would and she was concerned.  Dr. Percell Boston office recommended her to come to the Emergency Department.  While in the ED, she was found to have acute renal failure with a creatinine of 4.37 and she is being admitted for further evaluation and management.   PAST MEDICAL HISTORY: 1.  Hypertension.  2.  Osteoporosis.  3.  History of stable brain aneurysm.  4.  COPD.  5.  GERD.   MEDICATIONS AT HOME: 1.  Hyzaar 50/12.5 once daily.  2.  Advair 250/50 1 puff twice a day.  3.  Omeprazole 20 mg by mouth daily which was requested to increase twice a day by Dr. Vira Agar last week.  4.  Lomotil which was started last week.  5.  Calcium with vitamin D 1 tablet twice a day.   PAST SURGICAL HISTORY: 1.  Nasal polyp removed.  2.  Hernia surgery.  3.  Bilateral hip surgery.  4.   Left ankle surgery.   ALLERGIES:  SULFA AND CIPROFLOXACIN.   FAMILY HISTORY:  Father had coronary disease.  Mother with history of cancer.   SOCIAL HISTORY:  She lives by herself, fairly independent.  She does wear a Lifeline.  She denies any smoking.  She is a former smoker.  Drinks alcohol occasionally.   REVIEW OF SYSTEMS:  CONSTITUTIONAL:  No fever, fatigue or weakness.  Positive for weight loss of about 20 pounds in the last six months.  She was weighing around 154 to 155 pounds, now she is about 135, just not able to eat.  Does not have much appetite.  Feels bloated at times.  EYES:  No blurred or double vision.  EARS, NOSE, THROAT:  No tinnitus, ear pain.  RESPIRATORY:  No cough, wheezing, hemoptysis.  CARDIOVASCULAR:  No chest pain, orthopnea, edema.  GASTROINTESTINAL:  Positive for diarrhea and some intermittent abdominal pain.  No constipation.  No vomiting.  GENITOURINARY:  No dysuria, hematuria.  Unable to urinate since this morning. ENDOCRINE:  No polyuria or nocturia.  HEMATOLOGIC:  No anemia or easy bruising.  SKIN:  No rash or lesion.  MUSCULOSKELETAL:  Positive for arthritis.  NEUROLOGICA:  No tingling, numbness, weakness.  PSYCHIATRIC:  No history of anxiety or depression.   PHYSICAL EXAMINATION: VITAL SIGNS:  Temperature 98.2, heart rate 114 per minute, respirations 22 per minute,  blood pressure 101/52 mmHg.  She is saturating 98% on room air.  GENERAL:  The patient is an 81 year old female sitting in the wheelchair comfortably without any acute distress.  EYES:  Pupils equal, round, reactive to light and accommodation.  No scleral icterus.  Extraocular muscles intact.  HEENT:  Head atraumatic, normocephalic.  Oropharynx and nasopharynx clear.  NECK:  Supple.  No jugular venous distention.  No thyroid enlargement or tenderness.  LUNGS:  Clear to auscultation bilaterally.  No wheezing, rales, rhonchi or crepitation.  CARDIOVASCULAR:  S1, S2 normal.  No murmurs, rubs or  gallops.  ABDOMEN:  Soft, nontender, nondistended.  Bowel sounds present.  No organomegaly or mass.  EXTREMITIES:  No pedal edema, cyanosis, clubbing.  NEUROLOGIC:  Cranial nerves II through XII intact.  Muscle strength five out of 5 in all extremities.  Sensation intact.  PSYCHIATRIC:  The patient is alert and oriented x 3.  SKIN:  Dry and clear.  No rash, lesion, or ulcer.   LABORATORY DATA:  Normal liver function tests.  Normal CBC.  Negative stool studies including C. difficile.  BMP showed sodium 139, potassium 3.0, chloride 109, CO2 21, BUN 35, creatinine 4.37, blood sugar 115.   IMPRESSION AND PLAN: 1.  Acute renal failure, likely prerenal.  We will start on aggressive hydration and monitor her renal function.  Hold off any nephrotoxic medication.  This is likely from severe diarrhea and dehydration.  2.  Hypokalemia.  We will replete and recheck.  Check magnesium. 3.  Diarrhea, likely viral in nature.  Certainly could be a residual effect from bowel preparation material.  We will give her probiotics and Imodium.  Consult gastroenterology.  All her stool studies have already been negative.  We will hold off repeating same.  She may need to get checked for possible celiac sprue or cathartics, the fact as she has been losing weight.  Certainly needs more detailed evaluation.  She could have lactose intolerance also, although unlikely.  We will let gastroenterology evaluate further. 4.  For gastroesophageal reflux disease we will continue Protonix.     Total time taking care of this patient is 45 minutes.     ____________________________ Lucina Mellow. Manuella Ghazi, MD vss:ea D: 11/12/2013 16:18:02 ET T: 11/12/2013 17:11:38 ET JOB#: [id number]  cc: Jamey Harman S. Manuella Ghazi, MD, <Dictator> Richard L. Rosanna Randy, MD Manya Silvas, MD Lucina Mellow [facility] MD ELECTRONICALLY SIGNED 11/18/2013 0:38

## 2014-11-01 NOTE — Op Note (Signed)
PATIENT NAME:  [name] [name], [name] [name] MR#:  [id number] DATE OF BIRTH:  [birth year]  DATE OF PROCEDURE:  04/10/2014  PREOPERATIVE DIAGNOSIS: Rectal polyp, hemorrhoids.   POSTOPERATIVE DIAGNOSIS:  Rectal polyp, hemorrhoids.  PROCEDURE: Excision of rectal polyp.   SURGEON: Rochel Brome, MD    ANESTHESIA: General.   INDICATIONS: This 81 year old female recently had colonoscopy with findings of a tubular adenoma of the rectum, which could not be resected with the colonoscope.  It was palpable on rectal exam, anterior and slightly to the patient's left of the midline.  There were also markedly enlarged internal hemorrhoids. The excision of the rectal polyp was recommended for definitive treatment.   DESCRIPTION OF PROCEDURE: The patient was placed on the operating table in the supine position under general anesthesia.  Legs were elevated into the lithotomy position using ankle straps.  The anal area was examined.  There were multiple hemorrhoids identified.  Digital examination demonstrates presence of some stool, which was evacuated manually.  The polyp could be palpated anteriorly and was soft and mobile. The anal area was prepared with Betadine solution and draped with sterile towels and sheets.    The external hemorrhoids were further noted and appeared to be large in size.  Digital exam demonstrated the polyp, but no other palpable mass.  The bivalve anal retractor was introduced and identified.  A polyp in the anterior aspect of the rectum, slightly to the patient's left of the midline.  This polyp appeared to be approximately 1.5 cm in dimension and was adjacent to a large internal hemorrhoid and contiguous.   The Harmonic scalpel was used and initially excised the internal hemorrhoid, which was in continuity with the above lying polyp and the polyp was grasped with smooth pick-ups and retracted as the Harmonic scalpel was used to excise it.  It appeared that it was completely excised in contact with  the hemorrhoid and was submitted in formalin for routine pathology.  It is noted that hemostasis was intact.  The wound was further treated with multiple 3-0 chromic simple sutures to help with hemostasis during the postoperative period.  It is further noted that a small additional fragment of the internal hemorrhoid was resected with the Harmonic scalpel and that wound repaired with 3-0 chromic.  It is noted that hemostasis was very minimal during the procedure, approximately 0.1 mL.  The instruments were removed.  The patient appeared to tolerate the procedure satisfactorily and was then prepared for transfer to the recovery room.     ____________________________ Lenna Sciara. Rochel Brome, MD jws:DT D: 04/10/2014 11:34:39 ET T: 04/10/2014 13:47:46 ET JOB#: [id number]  cc: Loreli Dollar, MD, <Dictator> Loreli Dollar MD ELECTRONICALLY SIGNED 04/15/2014 17:55

## 2014-11-03 LAB — SURGICAL PATHOLOGY

## 2014-11-30 IMAGING — CR DG ANKLE COMPLETE 3+V*L*
1 series · 4 of 4 positions shown · non-contrast
Comparison: none

REASON FOR EXAM: fall
COMMENTS:

PROCEDURE:     DXR - DXR ANKLE LEFT COMPLETE  - December 01, 2012  [DATE]
RESULT:     Comparison: None.

[Series 1: x ankle ap left · 0.14mm/px · 4 of 4 slices shown]
[im 1/4]
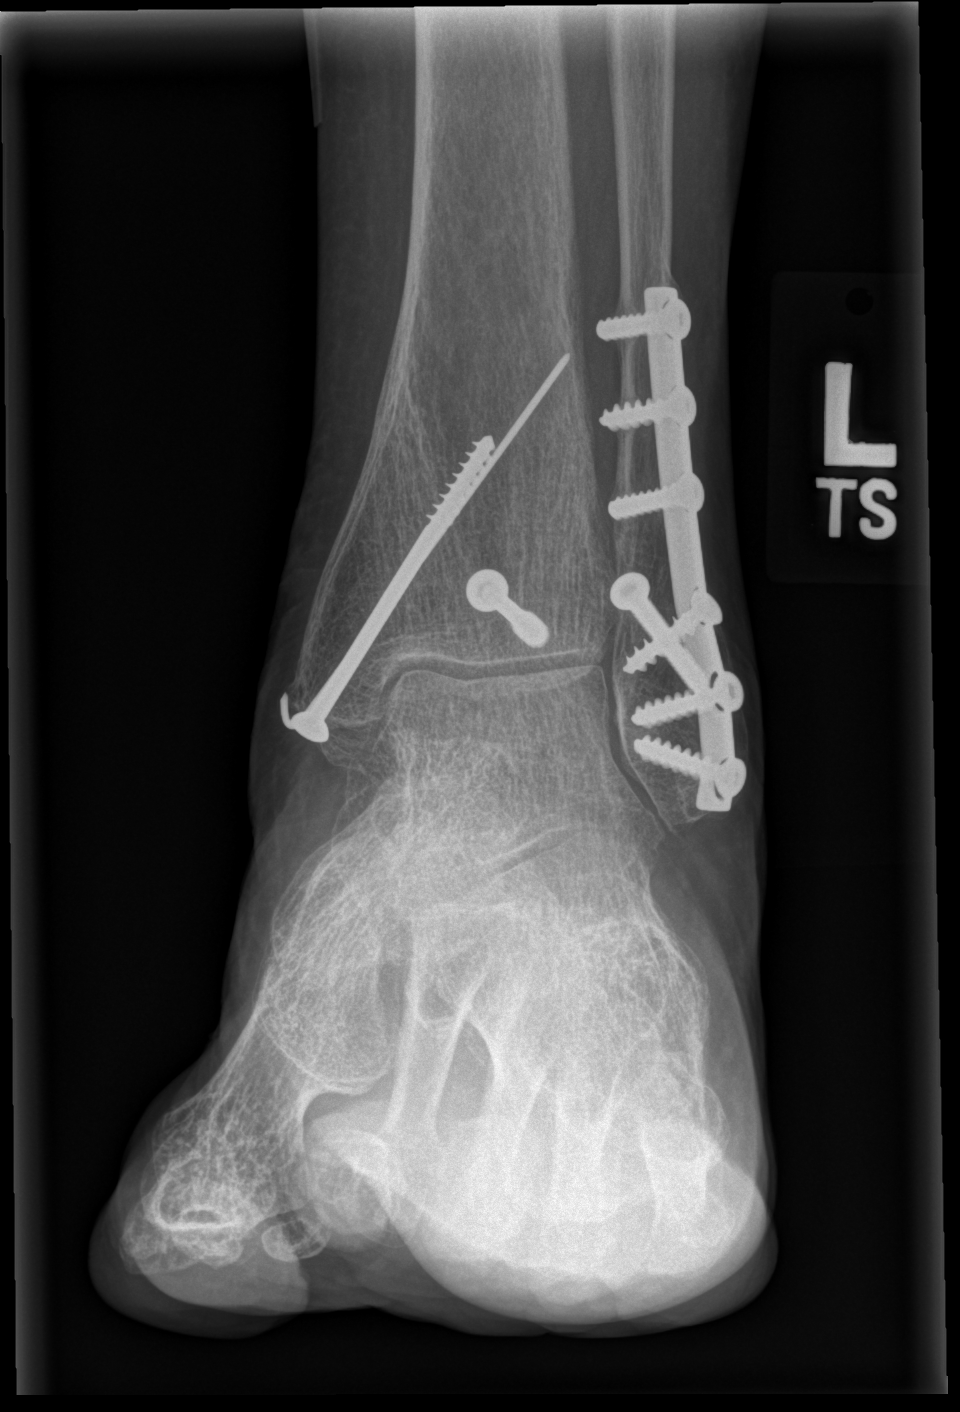
[im 2/4]
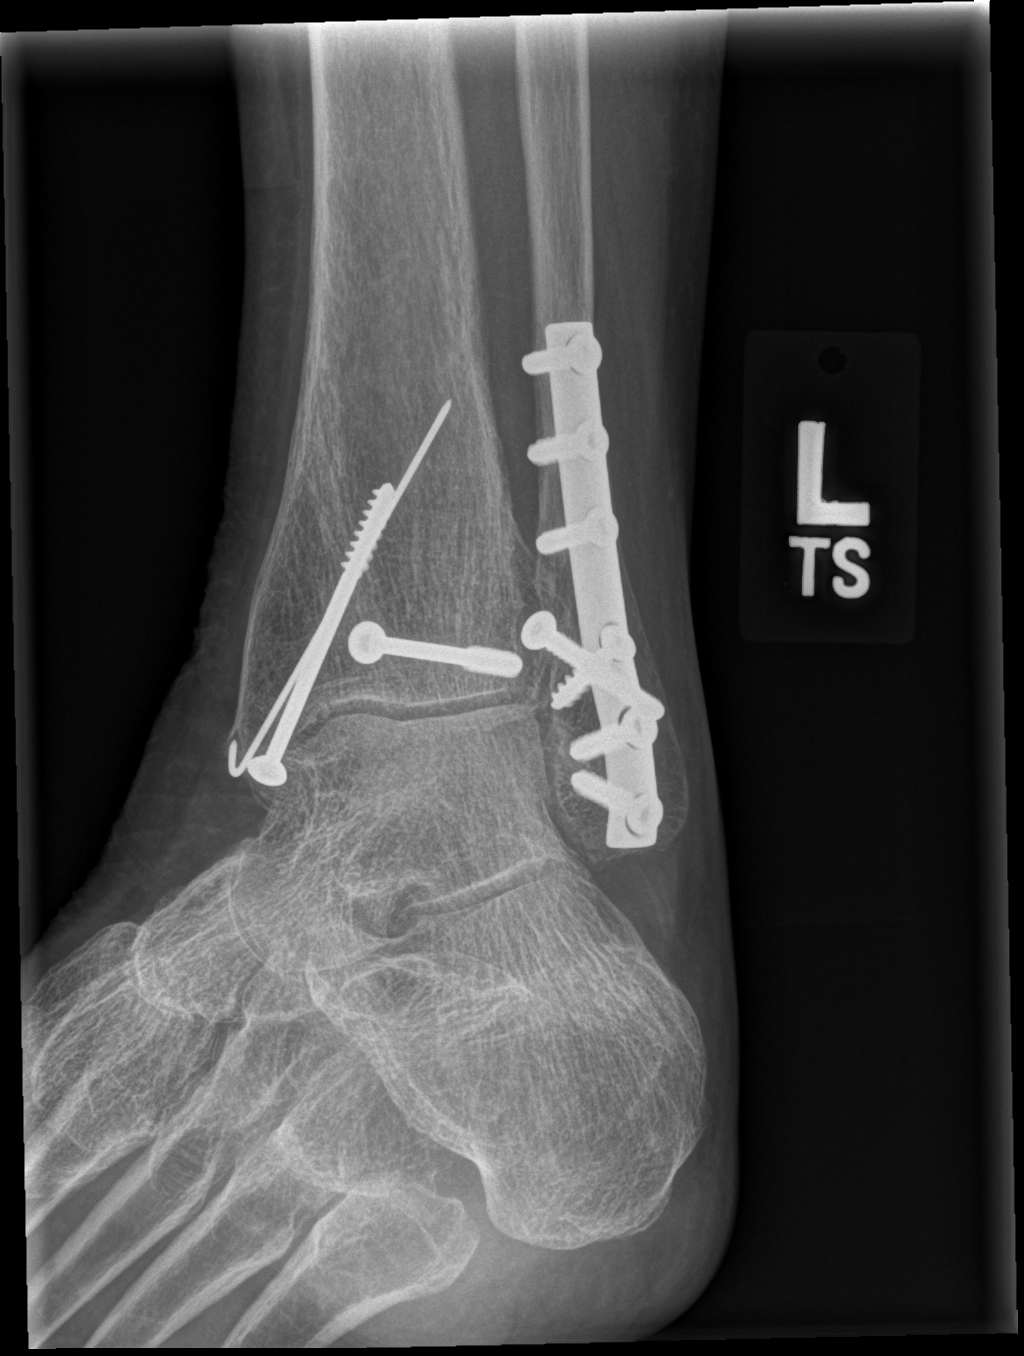
[im 3/4]
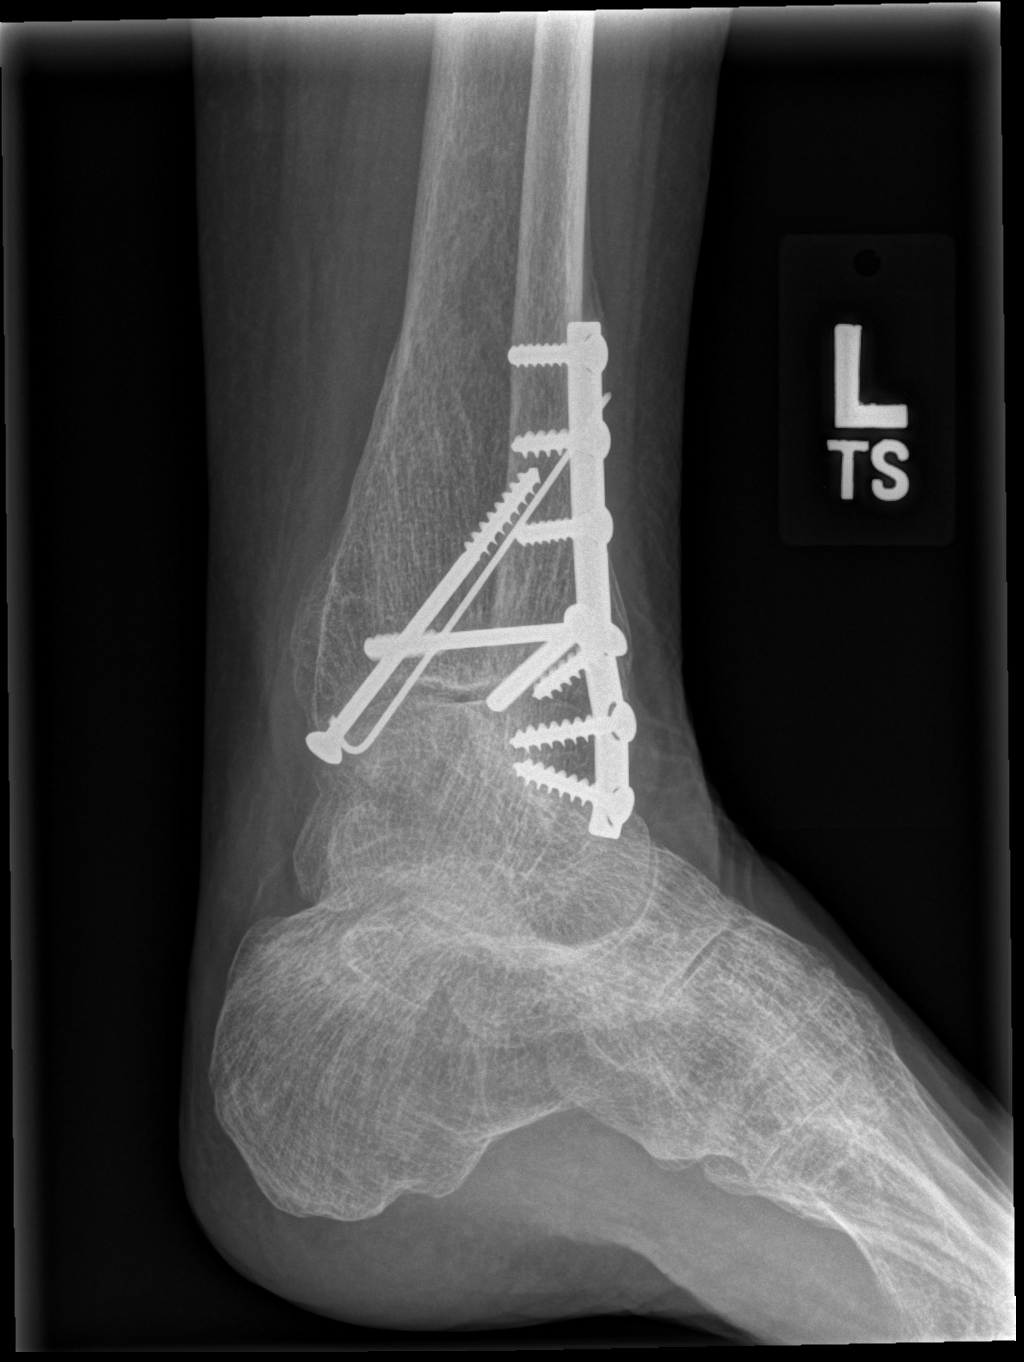
[im 4/4]
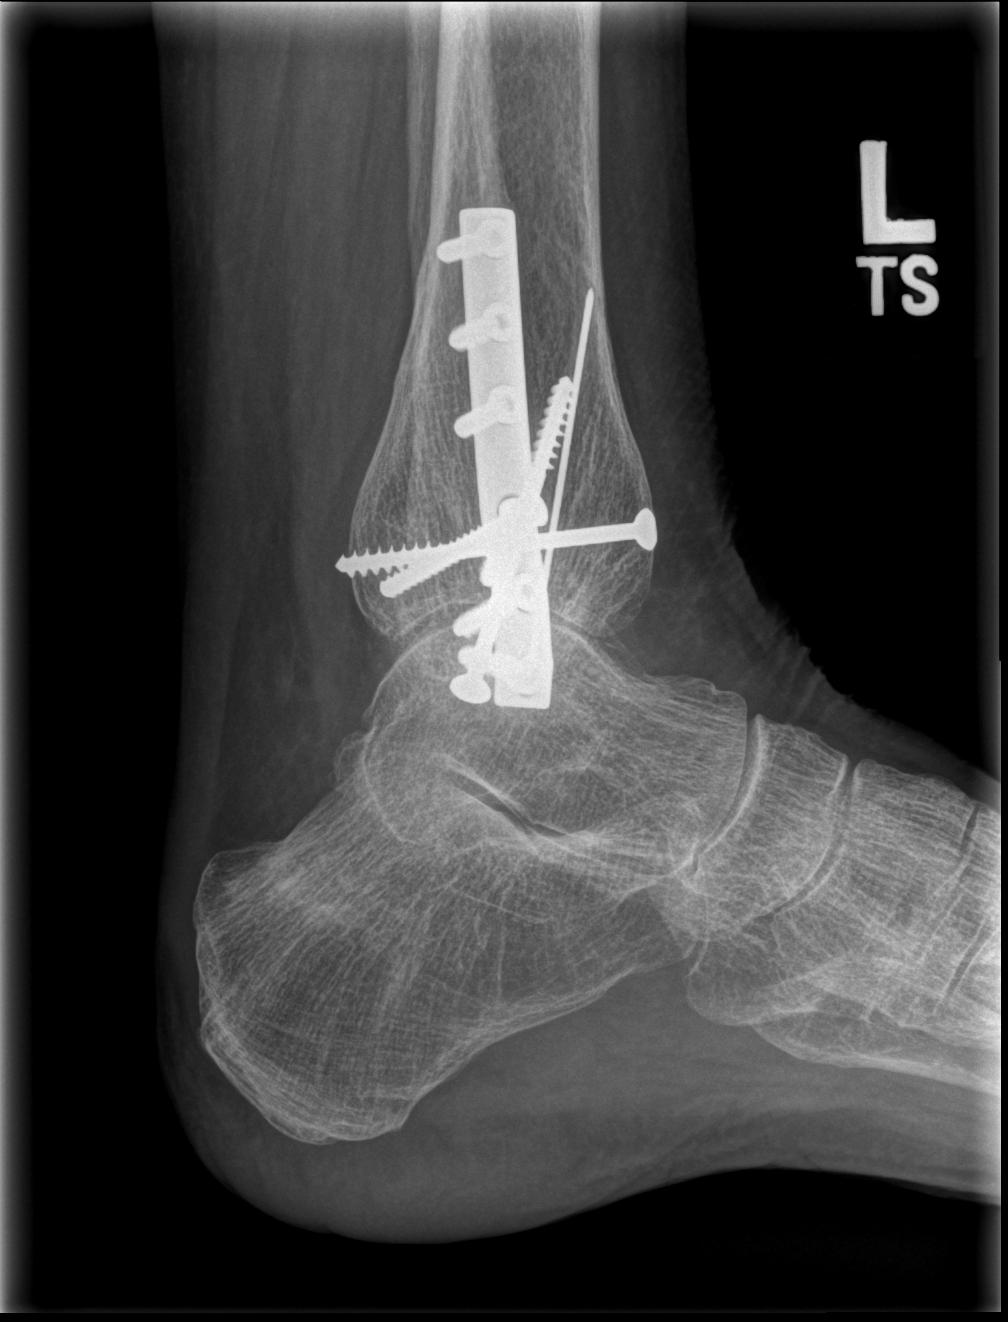

[4 of 4 positions shown; findings below may reference images not displayed]

FINDINGS: Lateral cortical fusion plate and screws seen traversing the distal fibula.
Multiple fixation screws and a wire are seen traversing the distal tibia.
There is a linear area of sclerosis in the posterior superior calcaneus.
This could represent an age-indeterminate fracture, including a
insufficiency fracture.
IMPRESSION: Findings which may represent an age-indeterminate fracture of the calcaneus.
An insufficiency fracture could have this appearance. Correlate with
patient's site of pain.

This was discussed with Dr. Herminio Mumm at 9000 hours 12/01/2012.

## 2014-11-30 IMAGING — CT CT CHEST W/O CM
1 series · 15 of 32 positions shown, 19 images · non-contrast
Comparison: none

REASON FOR EXAM: fall with left chest pain, possible diaphragmatic hernia
noted on xray
COMMENTS:

PROCEDURE:     CT  - CT CHEST WITHOUT CONTRAST  - December 01, 2012  [DATE]
RESULT:     Comparison: None
TECHNIQUE: Multiple axial images of the chest were obtained without
intravenous contrast.

[Series 2: soft tissue · axial · 0.75mm/px · z∈[-675,-423]mm · 15 of 96 slices shown, 19 images]
[im 8/96  mediastinal]
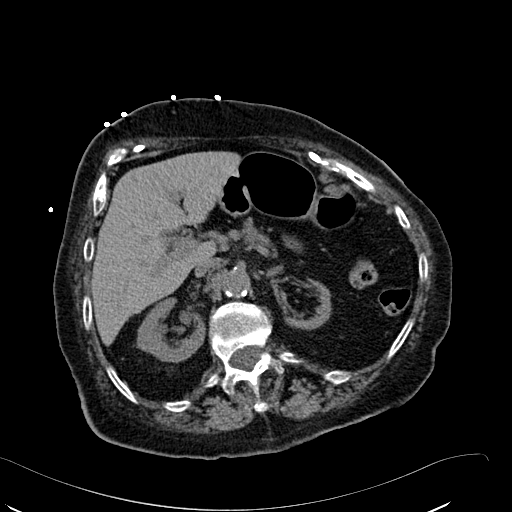
[im 8/96  lung]
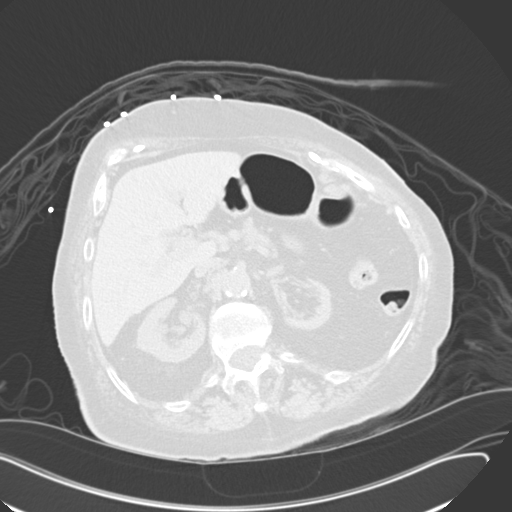
[im 15/96  lung]
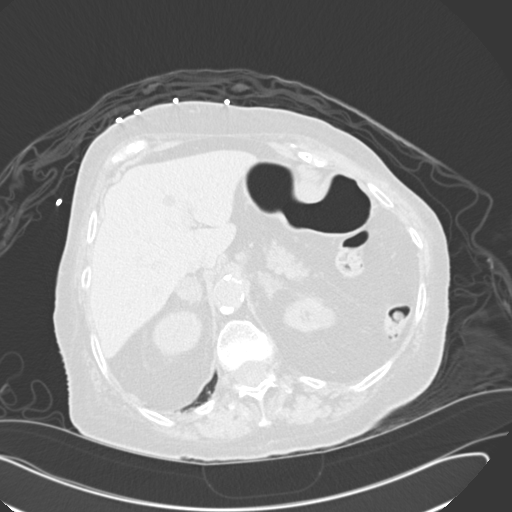
[im 20/96  lung]
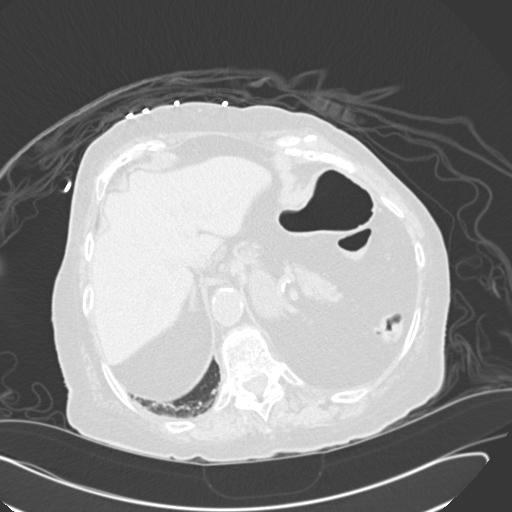
[im 25/96  lung]
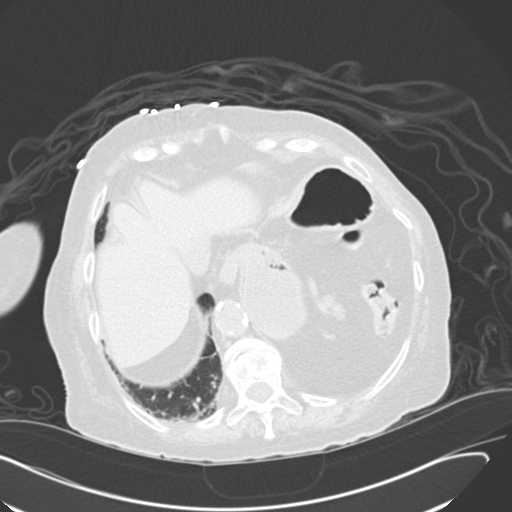
[im 32/96  mediastinal]
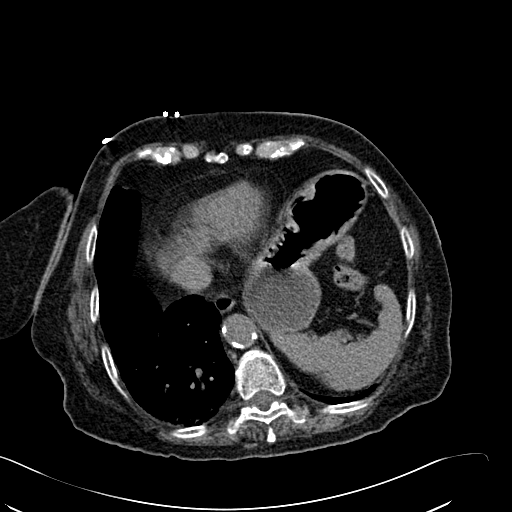
[im 32/96  lung]
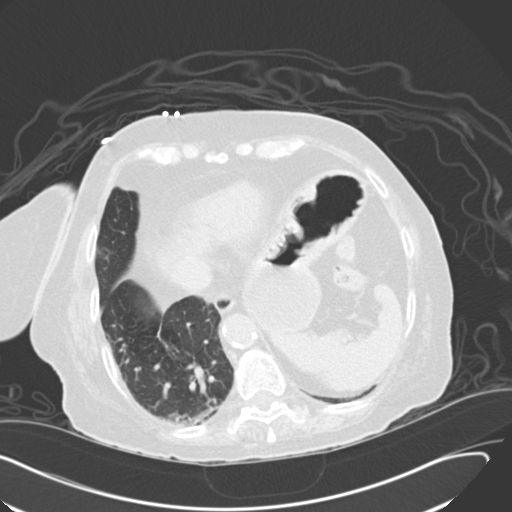
[im 39/96  lung]
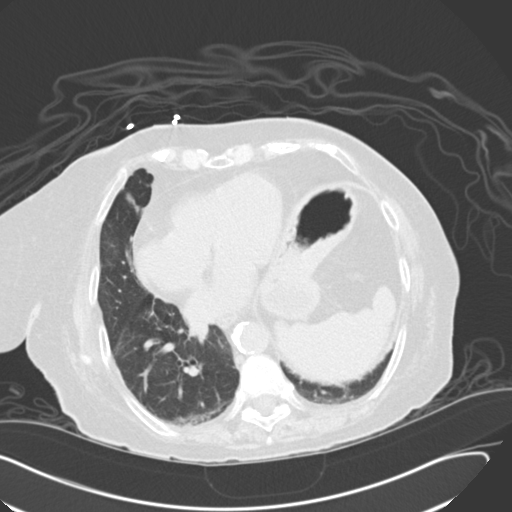
[im 46/96  lung]
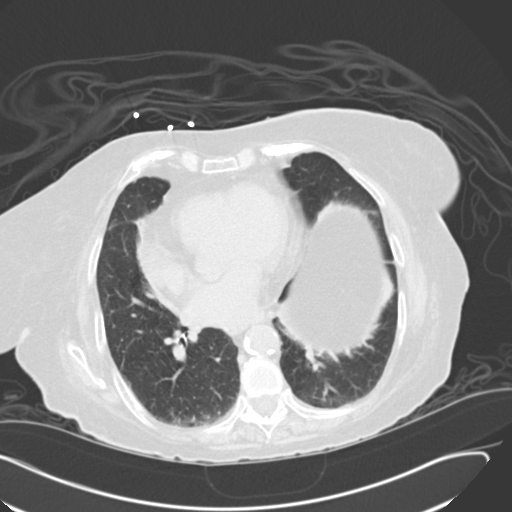
[im 51/96  lung]
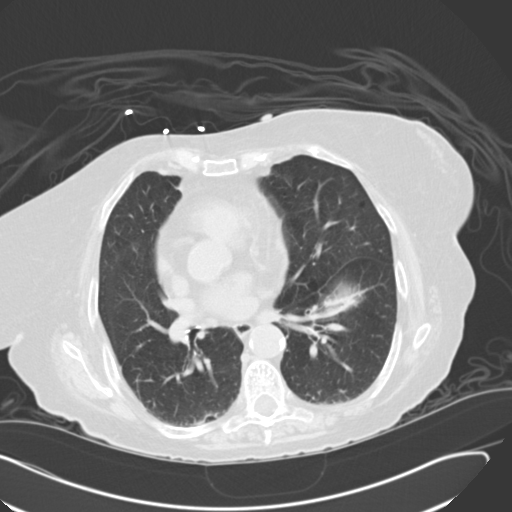
[im 57/96  mediastinal]
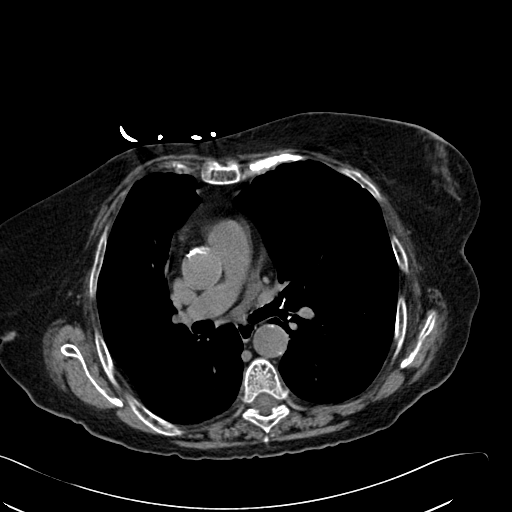
[im 57/96  lung]
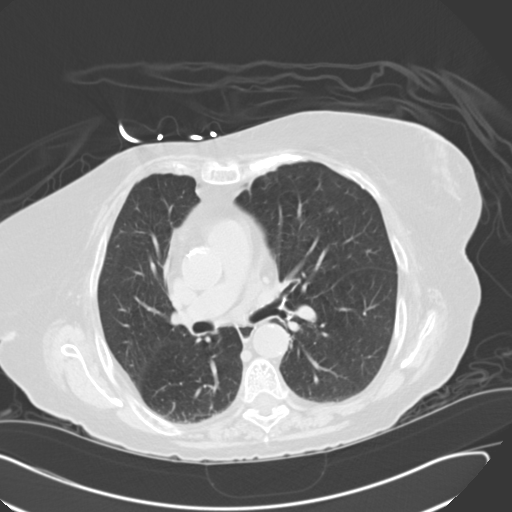
[im 60/96  lung]
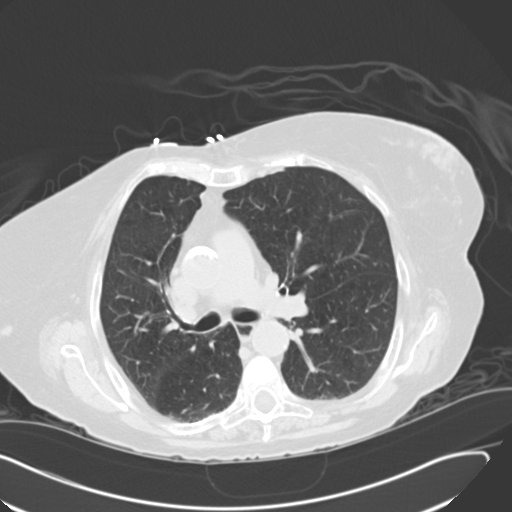
[im 67/96  lung]
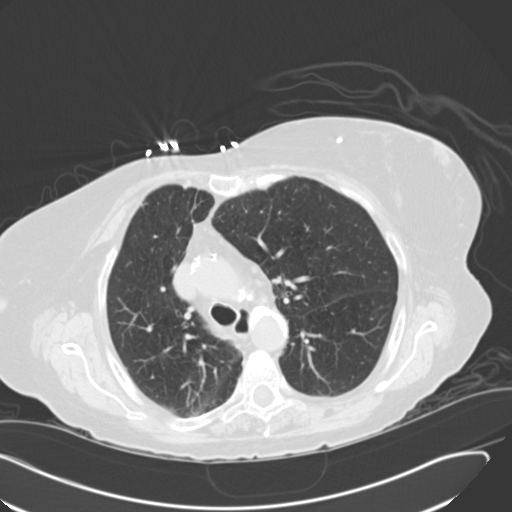
[im 74/96  lung]
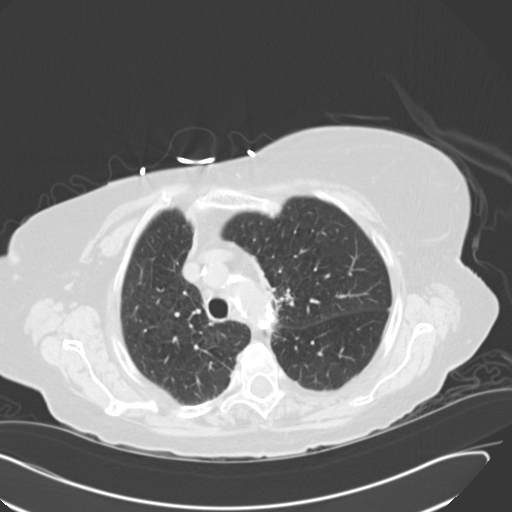
[im 78/96  mediastinal]
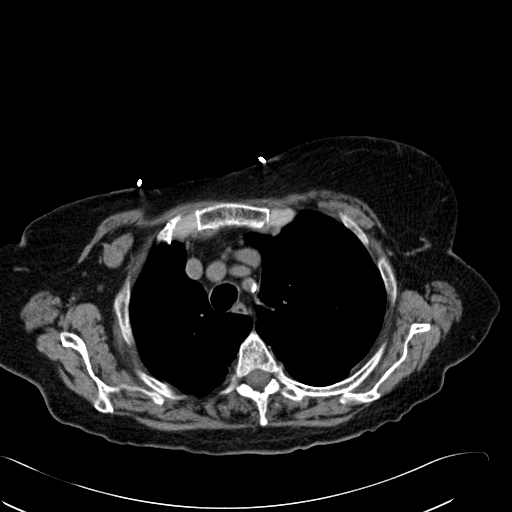
[im 78/96  lung]
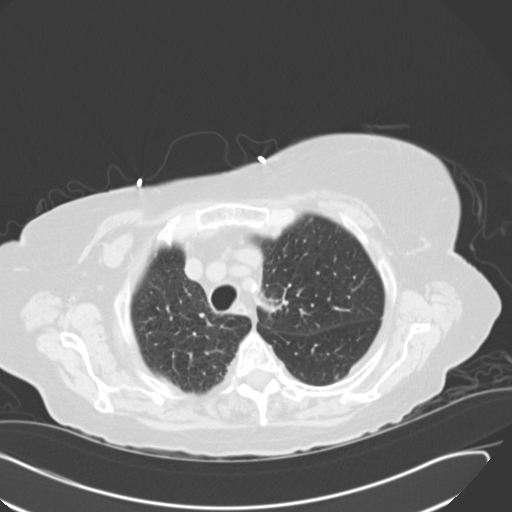
[im 85/96  lung]
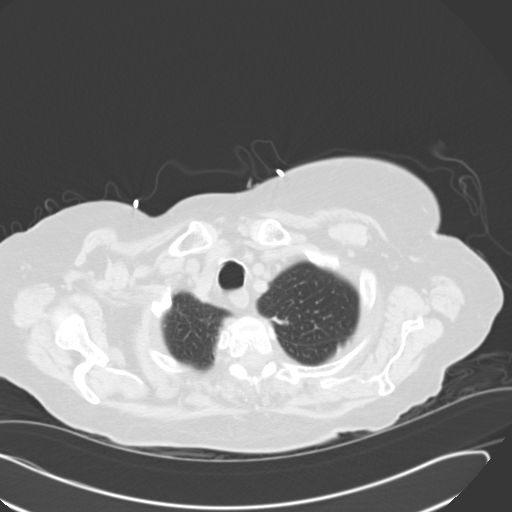
[im 92/96  lung]
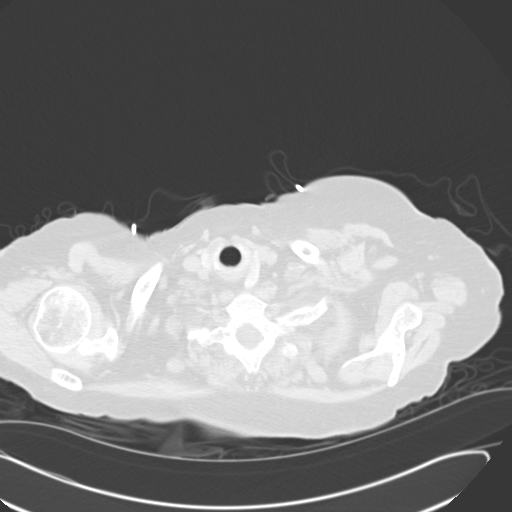

[15 of 32 positions shown; findings below may reference images not displayed]

FINDINGS: No mediastinal, hilar, or axillary lymphadenopathy. Calcifications are seen
in the coronary arteries. Small low-attenuation lesion in the left hepatic
lobe is too small to characterize. There is a 3 mm calculus in the right
kidney. Small low-attenuation lesion in the left kidney likely represents a
cyst. The left kidney is somewhat atrophic. There is moderate elevation of
the left hemidiaphragm. Small peripherally calcified density in the right
renal hilum may represent a partially calcified renal artery aneurysm versus
a tortuous partially calcified vessel. It measures approximately 10 mm in
diameter.

There is mild centrilobular emphysema. Mild basilar opacities are likely
secondary to atelectasis. Mild linear left paramediastinal opacities are
likely secondary to scarring/atelectasis. There is a 3 mm nodule in the left
upper lobe, image 41. A 4 mm nodules in the right middle lobe, image 48. The
central airways are patent.

No aggressive lytic or sclerotic osseous lesions are identified. There is a
chronic appearing mild anterior wedge compression deformity in the lower
thoracic spine.
IMPRESSION: 1. Moderate elevation of the left hemidiaphragm.
2. Mild left paramediastinal opacities are likely secondary to
atelectasis/scarring. However, followup noncontrast chest CT is suggested in
6 months to ensure stability/resolution.
2. Indeterminate subcentimeter pulmonary nodules which are 4 mm or less.
Recommend attention on the followup chest CT.

## 2014-12-23 DIAGNOSIS — K22710 Barrett's esophagus with low grade dysplasia: Secondary | ICD-10-CM | POA: Insufficient documentation

## 2014-12-23 DIAGNOSIS — J449 Chronic obstructive pulmonary disease, unspecified: Secondary | ICD-10-CM | POA: Insufficient documentation

## 2014-12-23 DIAGNOSIS — R000 Tachycardia, unspecified: Secondary | ICD-10-CM | POA: Insufficient documentation

## 2014-12-23 DIAGNOSIS — R5383 Other fatigue: Secondary | ICD-10-CM | POA: Insufficient documentation

## 2014-12-23 DIAGNOSIS — M1990 Unspecified osteoarthritis, unspecified site: Secondary | ICD-10-CM | POA: Insufficient documentation

## 2014-12-23 DIAGNOSIS — J309 Allergic rhinitis, unspecified: Secondary | ICD-10-CM | POA: Insufficient documentation

## 2014-12-23 DIAGNOSIS — E7800 Pure hypercholesterolemia, unspecified: Secondary | ICD-10-CM | POA: Insufficient documentation

## 2014-12-23 DIAGNOSIS — R4189 Other symptoms and signs involving cognitive functions and awareness: Secondary | ICD-10-CM | POA: Insufficient documentation

## 2014-12-23 DIAGNOSIS — M722 Plantar fascial fibromatosis: Secondary | ICD-10-CM | POA: Insufficient documentation

## 2014-12-23 DIAGNOSIS — M4850XA Collapsed vertebra, not elsewhere classified, site unspecified, initial encounter for fracture: Secondary | ICD-10-CM | POA: Insufficient documentation

## 2014-12-23 DIAGNOSIS — F5105 Insomnia due to other mental disorder: Secondary | ICD-10-CM | POA: Insufficient documentation

## 2014-12-23 DIAGNOSIS — N281 Cyst of kidney, acquired: Secondary | ICD-10-CM | POA: Insufficient documentation

## 2014-12-23 DIAGNOSIS — E785 Hyperlipidemia, unspecified: Secondary | ICD-10-CM | POA: Insufficient documentation

## 2014-12-23 DIAGNOSIS — E559 Vitamin D deficiency, unspecified: Secondary | ICD-10-CM | POA: Insufficient documentation

## 2014-12-23 DIAGNOSIS — K279 Peptic ulcer, site unspecified, unspecified as acute or chronic, without hemorrhage or perforation: Secondary | ICD-10-CM | POA: Insufficient documentation

## 2014-12-23 DIAGNOSIS — Z8711 Personal history of peptic ulcer disease: Secondary | ICD-10-CM | POA: Insufficient documentation

## 2014-12-23 DIAGNOSIS — I671 Cerebral aneurysm, nonruptured: Secondary | ICD-10-CM | POA: Insufficient documentation

## 2014-12-23 DIAGNOSIS — K219 Gastro-esophageal reflux disease without esophagitis: Secondary | ICD-10-CM | POA: Insufficient documentation

## 2014-12-23 DIAGNOSIS — M810 Age-related osteoporosis without current pathological fracture: Secondary | ICD-10-CM | POA: Insufficient documentation

## 2014-12-23 DIAGNOSIS — G8929 Other chronic pain: Secondary | ICD-10-CM | POA: Insufficient documentation

## 2014-12-23 DIAGNOSIS — M549 Dorsalgia, unspecified: Secondary | ICD-10-CM

## 2014-12-23 DIAGNOSIS — Z8719 Personal history of other diseases of the digestive system: Secondary | ICD-10-CM | POA: Insufficient documentation

## 2014-12-23 DIAGNOSIS — D369 Benign neoplasm, unspecified site: Secondary | ICD-10-CM | POA: Insufficient documentation

## 2014-12-23 DIAGNOSIS — I10 Essential (primary) hypertension: Secondary | ICD-10-CM | POA: Insufficient documentation

## 2014-12-24 ENCOUNTER — Encounter: Payer: Self-pay | Admitting: Family Medicine

## 2014-12-24 ENCOUNTER — Ambulatory Visit (INDEPENDENT_AMBULATORY_CARE_PROVIDER_SITE_OTHER): Payer: Medicare Other | Admitting: Family Medicine

## 2014-12-24 VITALS — BP 148/80 | HR 96 | Temp 97.7°F | Resp 16 | Wt 136.0 lb

## 2014-12-24 DIAGNOSIS — M549 Dorsalgia, unspecified: Secondary | ICD-10-CM | POA: Diagnosis not present

## 2014-12-24 DIAGNOSIS — M479 Spondylosis, unspecified: Secondary | ICD-10-CM

## 2014-12-24 DIAGNOSIS — R350 Frequency of micturition: Secondary | ICD-10-CM | POA: Diagnosis not present

## 2014-12-24 DIAGNOSIS — G8929 Other chronic pain: Secondary | ICD-10-CM

## 2014-12-24 DIAGNOSIS — J439 Emphysema, unspecified: Secondary | ICD-10-CM | POA: Insufficient documentation

## 2014-12-24 DIAGNOSIS — K2270 Barrett's esophagus without dysplasia: Secondary | ICD-10-CM

## 2014-12-24 LAB — POCT URINALYSIS DIPSTICK
Bilirubin, UA: NEGATIVE
GLUCOSE UA: NEGATIVE
Ketones, UA: NEGATIVE
Leukocytes, UA: NEGATIVE
NITRITE UA: NEGATIVE
Protein, UA: NEGATIVE
RBC UA: NEGATIVE
SPEC GRAV UA: 1.01
UROBILINOGEN UA: 0.2
pH, UA: 7

## 2014-12-24 LAB — GLUCOSE, POCT (MANUAL RESULT ENTRY): POC GLUCOSE: 94 mg/dL (ref 70–99)

## 2014-12-24 MED ORDER — PANTOPRAZOLE SODIUM 40 MG PO TBEC: 40.0000 mg | DELAYED_RELEASE_TABLET | Freq: Every day | ORAL | Status: DC

## 2014-12-24 MED ORDER — HYDROCODONE-ACETAMINOPHEN 5-300 MG PO TABS: 1.0000 | ORAL_TABLET | ORAL | Status: DC | PRN

## 2014-12-24 NOTE — Progress Notes (Signed)
Subjective:  Urinary Frequency  The current episode started more than 1 month ago. The problem has been gradually worsening. Associated symptoms include frequency and urgency. Pertinent negatives include no chills, discharge, flank pain, hematuria, hesitancy, nausea, possible pregnancy, sweats or vomiting. She has tried nothing for the symptoms.  Pt reports that she is getting up in the middle of the night to void, she feels like last night she was up all night long. She is thinking that it could be related to Omeprazole. She researched and saw something on TV about it causing urinary problems. However it is helping her stomach sx.   She is also here for a refill on her Hydrocodone/APAP.  Osteoarthritis. This is stable and the Vicodin controls her pain. Using her walker and she has had no falls.   Prior to Admission medications   Medication Sig Start Date End Date Taking? Authorizing Provider  calcium carbonate (OS-CAL) 600 MG TABS tablet Take by mouth.   Yes Historical Provider, MD  Cholecalciferol 1000 UNITS capsule Take by mouth.   Yes Historical Provider, MD  Fluticasone-Salmeterol (ADVAIR DISKUS) 250-50 MCG/DOSE AEPB Inhale into the lungs. 10/14/14  Yes Historical Provider, MD  Hydrocodone-Acetaminophen (VICODIN) 5-300 MG TABS Take by mouth. 09/04/14  Yes Historical Provider, MD  losartan-hydrochlorothiazide (HYZAAR) 100-25 MG per tablet Take by mouth. 10/14/14  Yes Historical Provider, MD  metoprolol tartrate (LOPRESSOR) 25 MG tablet Take by mouth. 12/11/13  Yes Historical Provider, MD  omeprazole (PRILOSEC) 20 MG capsule Take by mouth. 11/19/14  Yes Historical Provider, MD  Probiotic Product (PROBIOTIC DAILY) CAPS Take by mouth.   Yes Historical Provider, MD    Patient Active Problem List   Diagnosis Date Noted  . Allergic rhinitis 12/23/2014  . Back pain, chronic 12/23/2014  . Barrett's esophagus with low grade dysplasia 12/23/2014  . Aneurysm, cerebral 12/23/2014  . CAFL (chronic  airflow limitation) 12/23/2014  . Cognitive decline 12/23/2014  . Essential (primary) hypertension 12/23/2014  . Fatigue 12/23/2014  . Acid reflux 12/23/2014  . H/O peptic ulcer 12/23/2014  . History of biliary disease 12/23/2014  . HLD (hyperlipidemia) 12/23/2014  . Insomnia related to another mental disorder 12/23/2014  . Arthritis, degenerative 12/23/2014  . OP (osteoporosis) 12/23/2014  . Plantar fasciitis 12/23/2014  . Hypercholesterolemia without hypertriglyceridemia 12/23/2014  . Peptic ulcer 12/23/2014  . Kidney cysts 12/23/2014  . Fast heart beat 12/23/2014  . Tubular adenoma 12/23/2014  . Vertebral compression fracture 12/23/2014  . Avitaminosis D 12/23/2014  . HYPERTENSION 08/06/2007  . EMPHYSEMA 08/06/2007  . ASTHMA 08/06/2007  . C O P D 08/06/2007    Past Medical History  Diagnosis Date  . Asthma   . COPD (chronic obstructive pulmonary disease)     -FeV1 74% DLCO 56%  . Emphysema   . HTN (hypertension)   . Cataract     History   Social History  . Marital Status: Widowed    Spouse Name: N/A  . Number of Children: N/A  . Years of Education: N/A   Occupational History  . Not on file.   Social History Main Topics  . Smoking status: Former Smoker    Quit date: 07/11/2002  . Smokeless tobacco: Not on file  . Alcohol Use: 0.6 oz/week    1 Glasses of wine per week     Comment: occasionally  . Drug Use: No  . Sexual Activity: Not on file   Other Topics Concern  . Not on file   Social History Narrative  Allergies  Allergen Reactions  . Ciprofloxacin     Other reaction(s): Unknown  . Hydrocodone Bitartrate Er   . Metoprolol     feet swelling/pain  . Macrobid  [Nitrofurantoin]   . Sulfa Antibiotics   . Sulfonamide Derivatives     Review of Systems  Constitutional: Negative.  Negative for chills.  HENT: Negative.   Eyes: Negative.   Cardiovascular: Negative.   Gastrointestinal: Negative.  Negative for nausea and vomiting.    Genitourinary: Positive for urgency and frequency. Negative for hesitancy, hematuria and flank pain.  Musculoskeletal: Positive for back pain and joint pain. Negative for falls.  Skin: Negative.   Neurological: Negative.   Endo/Heme/Allergies: Negative.   Psychiatric/Behavioral: Negative.     Immunization History  Administered Date(s) Administered  . Pneumococcal Conjugate-13 07/14/2014  . Pneumococcal Polysaccharide-23 10/03/2012  . Zoster 05/23/2013   Objective:  BP 148/80 mmHg  Pulse 96  Temp(Src) 97.7 F (36.5 C) (Oral)  Resp 16  Wt 136 lb (61.689 kg)  Physical Exam  Constitutional: She is oriented to person, place, and time and well-developed, well-nourished, and in no distress.  HENT:  Head: Normocephalic and atraumatic.  Right Ear: External ear normal.  Left Ear: External ear normal.  Nose: Nose normal.  Mouth/Throat: Oropharynx is clear and moist.  Eyes: Conjunctivae and EOM are normal. Pupils are equal, round, and reactive to light.  Neck: Normal range of motion. Neck supple.  Cardiovascular: Normal rate, regular rhythm, normal heart sounds and intact distal pulses.   Pulmonary/Chest: Breath sounds normal.  Abdominal: Soft. Bowel sounds are normal.  No CVAT.  Genitourinary:  Visual exam of external genitalia is normal. Mild cystocele noted. Mild atrophy of the vaginal introitus.  Musculoskeletal: She exhibits no edema or tenderness.  Increased thoracic kyphosis noted.  Neurological: She is alert and oriented to person, place, and time.  Gait normal with her walker.  Skin: Skin is warm and dry.  Psychiatric: Mood, memory, affect and judgment normal.    Lab Results  Component Value Date   WBC 8.9 09/04/2014   HGB 12.9 09/04/2014   HCT 40 09/04/2014   PLT 248 09/04/2014   GLUCOSE 93 04/07/2014   CHOL 204* 05/28/2013   TRIG 98 05/28/2013   HDL 51 05/28/2013   LDLCALC 133 05/28/2013   TSH 2.02 09/04/2014    CMP     Component Value Date/Time   NA  142 09/04/2014   NA 142 04/07/2014 1317   K 4.2 09/04/2014   K 4.0 04/07/2014 1317   CL 108* 04/07/2014 1317   CO2 26 04/07/2014 1317   GLUCOSE 93 04/07/2014 1317   BUN 39* 09/04/2014   BUN 30* 04/07/2014 1317   CREATININE 1.2* 09/04/2014   CREATININE 1.22 04/07/2014 1317   CALCIUM 8.9 04/07/2014 1317   PROT 6.4 04/07/2014 1317   ALBUMIN 3.5 04/07/2014 1317   AST 13 09/04/2014   AST 16 04/07/2014 1317   ALT 10 09/04/2014   ALT 19 04/07/2014 1317   ALKPHOS 105 04/07/2014 1317   GFRNONAA 33* 12/07/2013 0452   GFRAA 38* 12/07/2013 0452    Assessment and Plan :  1. Urinary frequency Random blood sugar is normal at 94. This is not polyuria due to diabetes. Check urine culture for UTI. Mild cystocele most likely contributing culprit. Follow clinically 2. GERD Clinically slightly worse. For now replace omeprazole with pantoprazole 40 mg every morning. May need to add Zantac at night. 3. Hypertension Controlled 4. Osteoarthritis Rx for Vicodin refill 3--150  tablets. 5. Osteoporosis 6. COPD 7. Hyperlipidemia   Miguel Aschoff MD [facility] 12/24/2014 10:56 AM

## 2014-12-25 MED ORDER — HYDROCODONE-ACETAMINOPHEN 5-325 MG PO TABS: 1.0000 | ORAL_TABLET | Freq: Four times a day (QID) | ORAL | Status: DC | PRN

## 2015-02-18 ENCOUNTER — Ambulatory Visit (INDEPENDENT_AMBULATORY_CARE_PROVIDER_SITE_OTHER): Payer: Medicare Other | Admitting: Family Medicine

## 2015-02-18 ENCOUNTER — Encounter: Payer: Self-pay | Admitting: Family Medicine

## 2015-02-18 VITALS — BP 144/78 | HR 100 | Temp 98.0°F | Resp 16 | Ht 63.0 in | Wt 140.0 lb

## 2015-02-18 DIAGNOSIS — M479 Spondylosis, unspecified: Secondary | ICD-10-CM | POA: Diagnosis not present

## 2015-02-18 DIAGNOSIS — K22710 Barrett's esophagus with low grade dysplasia: Secondary | ICD-10-CM

## 2015-02-18 DIAGNOSIS — I10 Essential (primary) hypertension: Secondary | ICD-10-CM

## 2015-02-18 DIAGNOSIS — M549 Dorsalgia, unspecified: Secondary | ICD-10-CM | POA: Diagnosis not present

## 2015-02-18 DIAGNOSIS — R350 Frequency of micturition: Secondary | ICD-10-CM

## 2015-02-18 DIAGNOSIS — G8929 Other chronic pain: Secondary | ICD-10-CM | POA: Diagnosis not present

## 2015-02-18 MED ORDER — HYDROCODONE-ACETAMINOPHEN 5-325 MG PO TABS: 1.0000 | ORAL_TABLET | Freq: Four times a day (QID) | ORAL | Status: DC | PRN

## 2015-02-18 NOTE — Progress Notes (Signed)
Patient ID: [name] [name], female   DOB: Oct 13, 1929, 81 y.o.   MRN: [id number]    Subjective:  HPI  Hypertension follow up: Patient is here for 6 months follow up. She is not checking her B/P. She is still taking Losartan-HCTZ daily.  BP Readings from Last 3 Encounters:  02/18/15 144/78  12/24/14 148/80  09/04/14 128/64   GERD follow up:  Patient was here 2 months ago for increased urination. OMeprazole was thought that could possibly attribute to this so we switched to Pantoprazole. Symptoms of increased urination have not improved. Also states she has now watery eyes and postnasal drainage.  Ostearthritis follow up:  Patient takes Norco 1 at bedtime and sometimes another tablet during the day. On her last visit RX was written for 5/300 instead of 5/325 mg but insurance would not cover so patient called about this and RX was switched back to 5/325 mg but was given #30 tablets not #150 and patient wanted to discuss that today.  Prior to Admission medications   Medication Sig Start Date End Date Taking? Authorizing Provider  calcium carbonate (OS-CAL) 600 MG TABS tablet Take by mouth.    Historical Provider, MD  Cholecalciferol 1000 UNITS capsule Take by mouth.    Historical Provider, MD  Fluticasone-Salmeterol (ADVAIR DISKUS) 250-50 MCG/DOSE AEPB Inhale into the lungs. 10/14/14   Historical Provider, MD  HYDROcodone-acetaminophen (NORCO/VICODIN) 5-325 MG per tablet Take 1 tablet by mouth every 6 (six) hours as needed for moderate pain. 01/24/15   [name] Heslin [name] Pro., MD  HYDROcodone-acetaminophen (NORCO/VICODIN) 5-325 MG per tablet Take 1 tablet by mouth every 6 (six) hours as needed for moderate pain. 02/24/15   [name] Gorum [name] Pro., MD  HYDROcodone-acetaminophen (NORCO/VICODIN) 5-325 MG per tablet Take 1 tablet by mouth every 6 (six) hours as needed for moderate pain. 01/24/15   [name] Carrol [name] Pro., MD  losartan-hydrochlorothiazide (HYZAAR) 100-25 MG per tablet Take by mouth.  10/14/14   Historical Provider, MD  metoprolol tartrate (LOPRESSOR) 25 MG tablet Take by mouth. 12/11/13   Historical Provider, MD  omeprazole (PRILOSEC) 20 MG capsule Take by mouth. 11/19/14   Historical Provider, MD  pantoprazole (PROTONIX) 40 MG tablet Take 1 tablet (40 mg total) by mouth daily. 12/24/14   [name] Mano [name] Pro., MD  Probiotic Product (PROBIOTIC DAILY) CAPS Take by mouth.    Historical Provider, MD    Patient Active Problem List   Diagnosis Date Noted  . Chronic obstructive pulmonary emphysema 12/24/2014  . Allergic rhinitis 12/23/2014  . Back pain, chronic 12/23/2014  . Barrett's esophagus with low grade dysplasia 12/23/2014  . Aneurysm, cerebral 12/23/2014  . CAFL (chronic airflow limitation) 12/23/2014  . Cognitive decline 12/23/2014  . Essential (primary) hypertension 12/23/2014  . Fatigue 12/23/2014  . Acid reflux 12/23/2014  . H/O peptic ulcer 12/23/2014  . History of biliary disease 12/23/2014  . HLD (hyperlipidemia) 12/23/2014  . Insomnia related to another mental disorder 12/23/2014  . Arthritis, degenerative 12/23/2014  . OP (osteoporosis) 12/23/2014  . Plantar fasciitis 12/23/2014  . Hypercholesterolemia without hypertriglyceridemia 12/23/2014  . Peptic ulcer 12/23/2014  . Kidney cysts 12/23/2014  . Fast heart beat 12/23/2014  . Tubular adenoma 12/23/2014  . Vertebral compression fracture 12/23/2014  . Avitaminosis D 12/23/2014  . A-fib 04/08/2014  . HYPERTENSION 08/06/2007  . EMPHYSEMA 08/06/2007  . ASTHMA 08/06/2007  . C O P D 08/06/2007    Past Medical History  Diagnosis Date  . Asthma   . COPD (chronic obstructive  pulmonary disease)     -FeV1 74% DLCO 56%  . Emphysema   . HTN (hypertension)   . Cataract     Social History   Social History  . Marital Status: Widowed    Spouse Name: N/A  . Number of Children: N/A  . Years of Education: N/A   Occupational History  . Not on file.   Social History Main Topics  . Smoking status:  Former Smoker    Quit date: 07/11/2002  . Smokeless tobacco: Never Used  . Alcohol Use: 0.6 oz/week    1 Glasses of wine per week     Comment: occasionally  . Drug Use: No  . Sexual Activity: No   Other Topics Concern  . Not on file   Social History Narrative    Allergies  Allergen Reactions  . Ciprofloxacin     Other reaction(s): Unknown  . Metoprolol     feet swelling/pain  . Macrobid  [Nitrofurantoin]   . Sulfa Antibiotics   . Sulfonamide Derivatives     Review of Systems  Constitutional: Negative for fever, chills and malaise/fatigue.  Eyes: Positive for discharge.  Respiratory: Negative for cough, hemoptysis and sputum production.   Cardiovascular: Negative for chest pain, palpitations, orthopnea and leg swelling.  Gastrointestinal: Negative for heartburn, nausea, vomiting, abdominal pain and diarrhea.  Genitourinary: Positive for frequency.  Musculoskeletal: Positive for back pain and joint pain. Negative for myalgias, falls and neck pain.  Neurological: Negative for dizziness, tingling, weakness and headaches.    Immunization History  Administered Date(s) Administered  . Pneumococcal Conjugate-13 07/14/2014  . Pneumococcal Polysaccharide-23 10/03/2012  . Zoster 05/23/2013   Objective:  BP 144/78 mmHg  Pulse 100  Temp(Src) 98 F (36.7 C)  Resp 16  Ht '5\' 3"'$  (1.6 m)  Wt 140 lb (63.504 kg)  BMI 24.81 kg/m2  Physical Exam  Constitutional: She is oriented to person, place, and time and well-developed, well-nourished, and in no distress.  HENT:  Head: Normocephalic.  Eyes: Conjunctivae are normal. Pupils are equal, round, and reactive to light.  Neck: Normal range of motion. Neck supple.  Cardiovascular: Normal rate, regular rhythm, normal heart sounds and intact distal pulses.   No murmur heard. Pulmonary/Chest: Effort normal and breath sounds normal. No respiratory distress. She has no wheezes.  Musculoskeletal: She exhibits no edema or tenderness.    Neurological: She is alert and oriented to person, place, and time.  Psychiatric: Affect and judgment normal.    Lab Results  Component Value Date   WBC 8.9 09/04/2014   HGB 12.9 09/04/2014   HCT 40 09/04/2014   PLT 248 09/04/2014   GLUCOSE 93 04/07/2014   CHOL 204* 05/28/2013   TRIG 98 05/28/2013   HDL 51 05/28/2013   LDLCALC 133 05/28/2013   TSH 2.02 09/04/2014    CMP     Component Value Date/Time   NA 142 09/04/2014   NA 142 04/07/2014 1317   K 4.2 09/04/2014   K 4.0 04/07/2014 1317   CL 108* 04/07/2014 1317   CO2 26 04/07/2014 1317   GLUCOSE 93 04/07/2014 1317   BUN 39* 09/04/2014   BUN 30* 04/07/2014 1317   CREATININE 1.2* 09/04/2014   CREATININE 1.22 04/07/2014 1317   CALCIUM 8.9 04/07/2014 1317   PROT 6.4 04/07/2014 1317   ALBUMIN 3.5 04/07/2014 1317   AST 13 09/04/2014   AST 16 04/07/2014 1317   ALT 10 09/04/2014   ALT 19 04/07/2014 1317   ALKPHOS 105 04/07/2014 1317  BILITOT 0.4 04/07/2014 1317   GFRNONAA 33* 12/07/2013 0452   GFRAA 38* 12/07/2013 0452    Assessment and Plan :  1. Essential (primary) hypertension Stable. Continue current medication  2. Barrett's esophagus with low grade dysplasia Stable on Pantoprazole for life long PPI.  3. Osteoarthritis of spine, unspecified spinal osteoarthritis, unspecified spinal region Norco RX fixed with correct number of tablets today.  4. Back pain, chronic Stable.  5. Urinary frequency Unchanged. UA and Glucose levels are ok. Advised patient probably following for now is fine, offered referrel to urogyn (Dr.Harris) if patient wishes.  I just do not think there will be a lot of options for the patient to fix this problem.  6. Watery eyes/runny nose Patient will follow up with ophthalmologist in regards to her eyes. Runny nose could be related to watery eyes, possible AR or age related. Follow as needed.    Patient was seen and examined by Dr. Eulas Post and note was scribed by [name] [name], RMA.     Miguel Aschoff MD [facility] 02/18/2015 1:55 PM

## 2015-03-27 DIAGNOSIS — H3531 Nonexudative age-related macular degeneration: Secondary | ICD-10-CM | POA: Diagnosis not present

## 2015-04-27 DIAGNOSIS — L820 Inflamed seborrheic keratosis: Secondary | ICD-10-CM | POA: Diagnosis not present

## 2015-04-27 DIAGNOSIS — L578 Other skin changes due to chronic exposure to nonionizing radiation: Secondary | ICD-10-CM | POA: Diagnosis not present

## 2015-04-27 DIAGNOSIS — L821 Other seborrheic keratosis: Secondary | ICD-10-CM | POA: Diagnosis not present

## 2015-04-27 DIAGNOSIS — Z808 Family history of malignant neoplasm of other organs or systems: Secondary | ICD-10-CM | POA: Diagnosis not present

## 2015-05-22 DIAGNOSIS — Z23 Encounter for immunization: Secondary | ICD-10-CM | POA: Diagnosis not present

## 2015-06-15 ENCOUNTER — Ambulatory Visit (INDEPENDENT_AMBULATORY_CARE_PROVIDER_SITE_OTHER): Payer: Medicare Other | Admitting: Family Medicine

## 2015-06-15 ENCOUNTER — Encounter: Payer: Self-pay | Admitting: Family Medicine

## 2015-06-15 VITALS — BP 138/80 | HR 92 | Temp 97.7°F | Resp 16 | Wt 142.0 lb

## 2015-06-15 DIAGNOSIS — G8929 Other chronic pain: Secondary | ICD-10-CM

## 2015-06-15 DIAGNOSIS — K22710 Barrett's esophagus with low grade dysplasia: Secondary | ICD-10-CM

## 2015-06-15 DIAGNOSIS — M549 Dorsalgia, unspecified: Secondary | ICD-10-CM | POA: Diagnosis not present

## 2015-06-15 DIAGNOSIS — I10 Essential (primary) hypertension: Secondary | ICD-10-CM | POA: Diagnosis not present

## 2015-06-15 DIAGNOSIS — M479 Spondylosis, unspecified: Secondary | ICD-10-CM | POA: Diagnosis not present

## 2015-06-15 MED ORDER — HYDROCODONE-ACETAMINOPHEN 5-325 MG PO TABS: 1.0000 | ORAL_TABLET | Freq: Four times a day (QID) | ORAL | Status: DC | PRN

## 2015-06-15 NOTE — Progress Notes (Signed)
Patient ID: [name] [name], female   DOB: 12-Mar-1930, 81 y.o.   MRN: [id number]    Subjective:  HPI  Hypertension follow up: Patient is not checking her B/P at home. . BP Readings from Last 3 Encounters:  06/15/15 174/88  02/18/15 144/78  12/24/14 148/80  no cardiac symptoms  Chronic pain follow up: Patient takes Norco 1 tablet at bedtime and ocassionally if she wakes up during the night and is hurting and can not go back to sleep she will take another tablet at that time.  Lab Results  Component Value Date   CHOL 204* 05/28/2013   HDL 51 05/28/2013   LDLCALC 133 05/28/2013   TRIG 98 05/28/2013   Prior to Admission medications   Medication Sig Start Date End Date Taking? Authorizing Provider  calcium carbonate (OS-CAL) 600 MG TABS tablet Take by mouth.   Yes Historical Provider, MD  Cholecalciferol 1000 UNITS capsule Take by mouth.   Yes Historical Provider, MD  Fluticasone-Salmeterol (ADVAIR DISKUS) 250-50 MCG/DOSE AEPB Inhale 1 puff into the lungs daily.  10/14/14  Yes Historical Provider, MD  HYDROcodone-acetaminophen (NORCO/VICODIN) 5-325 MG per tablet Take 1 tablet by mouth every 6 (six) hours as needed for moderate pain. 02/24/15  Yes Richard Maceo Pro., MD  HYDROcodone-acetaminophen (NORCO/VICODIN) 5-325 MG per tablet Take 1 tablet by mouth every 6 (six) hours as needed for moderate pain. 02/18/15  Yes Richard Maceo Pro., MD  HYDROcodone-acetaminophen (NORCO/VICODIN) 5-325 MG per tablet Take 1 tablet by mouth every 6 (six) hours as needed for moderate pain. 02/18/15  Yes Richard Maceo Pro., MD  losartan-hydrochlorothiazide (HYZAAR) 100-25 MG per tablet Take by mouth. 10/14/14  Yes Historical Provider, MD  metoprolol tartrate (LOPRESSOR) 25 MG tablet Take by mouth. 12/11/13  Yes Historical Provider, MD  omeprazole (PRILOSEC) 20 MG capsule Take by mouth. 11/19/14  Yes Historical Provider, MD  pantoprazole (PROTONIX) 40 MG tablet Take 1 tablet (40 mg total) by mouth daily.  12/24/14  Yes Richard Maceo Pro., MD  Probiotic Product (PROBIOTIC DAILY) CAPS Take by mouth.   Yes Historical Provider, MD    Patient Active Problem List   Diagnosis Date Noted  . Chronic obstructive pulmonary emphysema ([place]) 12/24/2014  . Allergic rhinitis 12/23/2014  . Back pain, chronic 12/23/2014  . Barrett's esophagus with low grade dysplasia 12/23/2014  . Aneurysm, cerebral 12/23/2014  . CAFL (chronic airflow limitation) ([place]) 12/23/2014  . Cognitive decline 12/23/2014  . Essential (primary) hypertension 12/23/2014  . Fatigue 12/23/2014  . Acid reflux 12/23/2014  . H/O peptic ulcer 12/23/2014  . History of biliary disease 12/23/2014  . HLD (hyperlipidemia) 12/23/2014  . Insomnia related to another mental disorder 12/23/2014  . Arthritis, degenerative 12/23/2014  . OP (osteoporosis) 12/23/2014  . Plantar fasciitis 12/23/2014  . Hypercholesterolemia without hypertriglyceridemia 12/23/2014  . Peptic ulcer 12/23/2014  . Kidney cysts 12/23/2014  . Fast heart beat 12/23/2014  . Tubular adenoma 12/23/2014  . Vertebral compression fracture ([place]) 12/23/2014  . Avitaminosis D 12/23/2014  . A-fib ([place]) 04/08/2014  . HYPERTENSION 08/06/2007  . EMPHYSEMA 08/06/2007  . ASTHMA 08/06/2007  . C O P D 08/06/2007    Past Medical History  Diagnosis Date  . Asthma   . COPD (chronic obstructive pulmonary disease) ([facility])     -FeV1 74% DLCO 56%  . Emphysema   . HTN (hypertension)   . Cataract     Social History   Social History  . Marital Status: Widowed    Spouse  Name: N/A  . Number of Children: N/A  . Years of Education: N/A   Occupational History  . Not on file.   Social History Main Topics  . Smoking status: Former Smoker    Quit date: 07/11/2002  . Smokeless tobacco: Never Used  . Alcohol Use: 0.6 oz/week    1 Glasses of wine per week     Comment: occasionally  . Drug Use: No  . Sexual Activity: No   Other Topics Concern  . Not on file   Social History  Narrative    Allergies  Allergen Reactions  . Ciprofloxacin     Other reaction(s): Unknown  . Metoprolol     feet swelling/pain  . Macrobid  [Nitrofurantoin]   . Sulfa Antibiotics   . Sulfonamide Derivatives     Review of Systems  Constitutional: Positive for malaise/fatigue.  Respiratory: Negative.   Cardiovascular: Negative.   Gastrointestinal: Negative.   Musculoskeletal: Positive for back pain and joint pain.  Neurological: Positive for weakness.    Immunization History  Administered Date(s) Administered  . Pneumococcal Conjugate-13 07/14/2014  . Pneumococcal Polysaccharide-23 10/03/2012  . Zoster 05/23/2013   Objective:  BP 174/88 mmHg  Pulse 92  Temp(Src) 97.7 F (36.5 C)  Resp 16  Wt 142 lb (64.411 kg)  Physical Exam  Constitutional: She is oriented to person, place, and time and well-developed, well-nourished, and in no distress.  Frail WF NAD.  HENT:  Head: Normocephalic and atraumatic.  Eyes: Conjunctivae are normal. Pupils are equal, round, and reactive to light.  Neck: Normal range of motion. Neck supple. No thyromegaly present.  Cardiovascular: Normal rate, regular rhythm, normal heart sounds and intact distal pulses.   No murmur heard. Pulmonary/Chest: Effort normal and breath sounds normal. No respiratory distress. She has no wheezes.  Abdominal: Soft.  Musculoskeletal: She exhibits no edema or tenderness.  No point tenderness on the spine  Neurological: She is alert and oriented to person, place, and time.  Skin: Skin is warm and dry.  Psychiatric: Mood, memory, affect and judgment normal.    Lab Results  Component Value Date   WBC 8.9 09/04/2014   HGB 12.9 09/04/2014   HCT 40 09/04/2014   PLT 248 09/04/2014   GLUCOSE 93 04/07/2014   CHOL 204* 05/28/2013   TRIG 98 05/28/2013   HDL 51 05/28/2013   LDLCALC 133 05/28/2013   TSH 2.02 09/04/2014    CMP     Component Value Date/Time   NA 142 09/04/2014   NA 142 04/07/2014 1317   K  4.2 09/04/2014   K 4.0 04/07/2014 1317   CL 108* 04/07/2014 1317   CO2 26 04/07/2014 1317   GLUCOSE 93 04/07/2014 1317   BUN 39* 09/04/2014   BUN 30* 04/07/2014 1317   CREATININE 1.2* 09/04/2014   CREATININE 1.22 04/07/2014 1317   CALCIUM 8.9 04/07/2014 1317   PROT 6.4 04/07/2014 1317   ALBUMIN 3.5 04/07/2014 1317   AST 13 09/04/2014   AST 16 04/07/2014 1317   ALT 10 09/04/2014   ALT 19 04/07/2014 1317   ALKPHOS 105 04/07/2014 1317   BILITOT 0.4 04/07/2014 1317   GFRNONAA 45* 04/07/2014 1317   GFRNONAA 33* 12/07/2013 0452   GFRAA 54* 04/07/2014 1317   GFRAA 38* 12/07/2013 0452    Assessment and Plan :  1. Essential (primary) hypertension Stable. Continue current medication.  2. Barrett's esophagus with low grade dysplasia Stable.PPI for life.  3. Osteoarthritis of spine, unspecified spinal osteoarthritis, unspecified spinal region  Stable.  4. Back pain, chronic Stable. RFs provided. Pt tries to limit Vicodin to minimize fall risk. 5.COPD Stable since pt quit smoking. 6.Malnutition with chronic debilitation  Discussed age and disease at some length today.  I have done the exam and reviewed the above chart and it is accurate to the best of my knowledge.  Patient was seen and examined by Dr. Eulas Post and note was scribed by Theressa Millard, RMA.    Miguel Aschoff MD [facility] 06/15/2015 1:45 PM

## 2015-08-13 ENCOUNTER — Ambulatory Visit (INDEPENDENT_AMBULATORY_CARE_PROVIDER_SITE_OTHER): Payer: Medicare Other | Admitting: Family Medicine

## 2015-08-13 ENCOUNTER — Encounter: Payer: Self-pay | Admitting: Family Medicine

## 2015-08-13 VITALS — BP 140/66 | HR 116 | Temp 97.5°F | Resp 16 | Wt 142.8 lb

## 2015-08-13 DIAGNOSIS — J320 Chronic maxillary sinusitis: Secondary | ICD-10-CM | POA: Diagnosis not present

## 2015-08-13 MED ORDER — AMOXICILLIN 500 MG PO CAPS: 500.0000 mg | ORAL_CAPSULE | Freq: Two times a day (BID) | ORAL | Status: DC

## 2015-08-13 NOTE — Progress Notes (Signed)
Patient ID: [name] [name], female   DOB: 1929/08/01, 82 y.o.   MRN: [id number]   Patient: [name] [name] Female    DOB: 1930-03-03   82 y.o.   MRN: [id number] Visit Date: 08/13/2015  Today's Provider: Wilhemena Durie, MD   Chief Complaint  Patient presents with  . Sinusitis   Subjective:    Sinusitis This is a new problem. The current episode started yesterday. The problem has been gradually worsening since onset. Associated symptoms include coughing and sinus pressure. (Post nasal drip) Past treatments include nothing.      Previous Medications   CALCIUM CARBONATE (OS-CAL) 600 MG TABS TABLET    Take by mouth.   CHOLECALCIFEROL 1000 UNITS CAPSULE    Take by mouth.   FLUTICASONE-SALMETEROL (ADVAIR DISKUS) 250-50 MCG/DOSE AEPB    Inhale 1 puff into the lungs daily.    HYDROCODONE-ACETAMINOPHEN (NORCO/VICODIN) 5-325 MG TABLET    Take 1 tablet by mouth every 6 (six) hours as needed for moderate pain.   LOSARTAN-HYDROCHLOROTHIAZIDE (HYZAAR) 100-25 MG PER TABLET    Take by mouth.   PANTOPRAZOLE (PROTONIX) 40 MG TABLET    Take 1 tablet (40 mg total) by mouth daily.   PROBIOTIC PRODUCT (PROBIOTIC DAILY) CAPS    Take by mouth.    Review of Systems  Constitutional: Negative.   HENT: Positive for sinus pressure.        Post nasal drip  Eyes: Negative.   Respiratory: Positive for cough.   Cardiovascular: Negative.   Gastrointestinal: Negative.   Endocrine: Negative.   Genitourinary: Negative.   Musculoskeletal: Negative.   Skin: Negative.   Allergic/Immunologic: Negative.   Neurological: Negative.   Hematological: Negative.   Psychiatric/Behavioral: Negative.     Social History  Substance Use Topics  . Smoking status: Former Smoker    Quit date: 07/11/2002  . Smokeless tobacco: Never Used  . Alcohol Use: 0.6 oz/week    1 Glasses of wine per week     Comment: occasionally   Objective:   BP 140/66 mmHg  Pulse 116  Temp(Src) 97.5 F (36.4 C) (Oral)  Resp 16  Wt 142  lb 12.8 oz (64.774 kg)  SpO2 95%  Physical Exam  Constitutional: She is oriented to person, place, and time. She appears well-developed and well-nourished.  HENT:  Head: Normocephalic and atraumatic.  Right Ear: External ear normal.  Left Ear: External ear normal.  Mouth/Throat: Oropharynx is clear and moist.  Right maxillary tenderness  Eyes: Conjunctivae and EOM are normal. Pupils are equal, round, and reactive to light.  Neck: Normal range of motion. Neck supple.  Cardiovascular: Normal rate, regular rhythm, normal heart sounds and intact distal pulses.   Pulmonary/Chest: Effort normal and breath sounds normal.  Abdominal: Soft. Bowel sounds are normal.  Musculoskeletal: Normal range of motion.  Neurological: She is alert and oriented to person, place, and time. She has normal reflexes.  Skin: Skin is warm and dry.  Psychiatric: She has a normal mood and affect. Her behavior is normal. Judgment and thought content normal.        Assessment & Plan:     1. Chronic right maxillary sinusitis Amoxicillin 500 mg BID for 14 days. Push plenty of fluids.  2.COPD Stable/controlled.  Follow up: PRN

## 2015-09-22 ENCOUNTER — Encounter: Payer: Self-pay | Admitting: Family Medicine

## 2015-09-22 ENCOUNTER — Ambulatory Visit (INDEPENDENT_AMBULATORY_CARE_PROVIDER_SITE_OTHER): Payer: Medicare Other | Admitting: Family Medicine

## 2015-09-22 VITALS — BP 128/82 | HR 99 | Temp 97.7°F | Resp 16 | Wt 142.8 lb

## 2015-09-22 DIAGNOSIS — J3489 Other specified disorders of nose and nasal sinuses: Secondary | ICD-10-CM

## 2015-09-22 DIAGNOSIS — J301 Allergic rhinitis due to pollen: Secondary | ICD-10-CM

## 2015-09-22 MED ORDER — AMOXICILLIN-POT CLAVULANATE 875-125 MG PO TABS: 1.0000 | ORAL_TABLET | Freq: Two times a day (BID) | ORAL | Status: DC

## 2015-09-22 NOTE — Progress Notes (Signed)
Subjective:     Patient ID: [name] [name], female   DOB: [birth year], 82 y.o.   MRN: [id number]  HPI  Chief Complaint  Patient presents with  . Facial Pain    Patient comes in office today with concerns of sinus pain and pressure on right side of her face for the pasty two days.   Reports PND with throat clearing as well. Was treated for similar sx in early February with Amoxicillin. Remote hx of nasal polyp surgery. States she has been to her dentist with similar symptoms in December but told her it was not her tooth.   Review of Systems     Objective:   Physical Exam  Constitutional: She appears well-developed and well-nourished. No distress.  Ears: T.M's intact without inflammation Throat: tonsils absent Neck: no cervical adenopathy Lungs: clear     Assessment:    1. Allergic rhinitis due to pollen  2. Sinus pressure - amoxicillin-clavulanate (AUGMENTIN) 875-125 MG tablet; Take 1 tablet by mouth 2 (two) times daily.  Dispense: 20 tablet; Refill: 0    Plan:    Discussed starting with Flonase and Claritin D. If sinuses not improving to start antibiotic. Consider ENT referral if recurs in similar atypical fashion.

## 2015-09-22 NOTE — Patient Instructions (Signed)
Start Flonase nasal spray two squirts each nostril daily and add Claritin D. If sinuses not improving over the next few days (increased pressure or colored drainage) start the antibiotic.

## 2015-10-19 ENCOUNTER — Other Ambulatory Visit: Payer: Self-pay | Admitting: Family Medicine

## 2015-11-16 ENCOUNTER — Encounter: Payer: Self-pay | Admitting: Family Medicine

## 2015-11-16 ENCOUNTER — Ambulatory Visit (INDEPENDENT_AMBULATORY_CARE_PROVIDER_SITE_OTHER): Payer: Medicare Other | Admitting: Family Medicine

## 2015-11-16 VITALS — BP 138/60 | HR 96 | Temp 97.6°F | Resp 16 | Wt 143.0 lb

## 2015-11-16 DIAGNOSIS — M549 Dorsalgia, unspecified: Secondary | ICD-10-CM

## 2015-11-16 DIAGNOSIS — G8929 Other chronic pain: Secondary | ICD-10-CM

## 2015-11-16 DIAGNOSIS — M479 Spondylosis, unspecified: Secondary | ICD-10-CM

## 2015-11-16 DIAGNOSIS — I10 Essential (primary) hypertension: Secondary | ICD-10-CM | POA: Diagnosis not present

## 2015-11-16 DIAGNOSIS — J439 Emphysema, unspecified: Secondary | ICD-10-CM | POA: Insufficient documentation

## 2015-11-16 MED ORDER — HYDROCODONE-ACETAMINOPHEN 5-325 MG PO TABS: 1.0000 | ORAL_TABLET | Freq: Four times a day (QID) | ORAL | Status: DC | PRN

## 2015-11-16 NOTE — Progress Notes (Signed)
Patient ID: [name] [name], female   DOB: August 12, 1929, 82 y.o.   MRN: [id number]    Subjective:  HPI  Hypertension, follow-up:  BP Readings from Last 3 Encounters:  11/16/15 138/60  09/22/15 128/82  08/13/15 140/66    She was last seen for hypertension 5 months ago.  BP at that visit was 128/82. Management since that visit includes none. She reports good compliance with treatment. She is not having side effects.  She is not exercising. She is adherent to low salt diet.   Outside blood pressures are not being checked. She is experiencing dyspnea. This is base line for pt.  Patient denies chest pain, chest pressure/discomfort, claudication, exertional chest pressure/discomfort, fatigue, irregular heart beat, lower extremity edema, near-syncope, orthopnea, palpitations and paroxysmal nocturnal dyspnea.    Wt Readings from Last 3 Encounters:  11/16/15 143 lb (64.864 kg)  09/22/15 142 lb 12.8 oz (64.774 kg)  08/13/15 142 lb 12.8 oz (64.774 kg)   ------------------------------------------------------------------------   Chronic Pain- Pt reports that her pain is about the same, she is only taking her pain meds at bedtime unless she goes somewhere that she has to walk a lot.     Prior to Admission medications   Medication Sig Start Date End Date Taking? Authorizing Provider  ADVAIR DISKUS 250-50 MCG/DOSE AEPB USE ONE ORAL INHALATION EVERY 12 HOURS BREATHE IN STEADILY AND DEEPLYRINSE MOUTH AFTER USE 10/19/15  Yes Lizandro Spellman Maceo Pro., MD  calcium carbonate (OS-CAL) 600 MG TABS tablet Take by mouth. Reported on 11/16/2015   Yes Historical Provider, MD  Cholecalciferol 1000 UNITS capsule Take by mouth.   Yes Historical Provider, MD  HYDROcodone-acetaminophen (NORCO/VICODIN) 5-325 MG tablet Take 1 tablet by mouth every 6 (six) hours as needed for moderate pain. 06/15/15  Yes Toby Breithaupt Maceo Pro., MD  losartan-hydrochlorothiazide [facility]) 100-25 MG tablet TAKE 1 TABLET EVERY DAY USUALLY  IN THE MORNING 10/19/15  Yes Kaytlyn Din Maceo Pro., MD  pantoprazole (PROTONIX) 40 MG tablet Take 1 tablet (40 mg total) by mouth daily. 12/24/14  Yes Savvy Peeters Maceo Pro., MD  Probiotic Product (PROBIOTIC DAILY) CAPS Take by mouth.   Yes Historical Provider, MD    Patient Active Problem List   Diagnosis Date Noted  . Pulmonary emphysema ([place]) 11/16/2015  . Chronic obstructive pulmonary emphysema ([place]) 12/24/2014  . Allergic rhinitis 12/23/2014  . Back pain, chronic 12/23/2014  . Barrett's esophagus with low grade dysplasia 12/23/2014  . Aneurysm, cerebral 12/23/2014  . CAFL (chronic airflow limitation) ([place]) 12/23/2014  . Cognitive decline 12/23/2014  . Essential (primary) hypertension 12/23/2014  . Fatigue 12/23/2014  . Acid reflux 12/23/2014  . H/O peptic ulcer 12/23/2014  . History of biliary disease 12/23/2014  . HLD (hyperlipidemia) 12/23/2014  . Insomnia related to another mental disorder 12/23/2014  . Arthritis, degenerative 12/23/2014  . OP (osteoporosis) 12/23/2014  . Plantar fasciitis 12/23/2014  . Hypercholesterolemia without hypertriglyceridemia 12/23/2014  . Peptic ulcer 12/23/2014  . Kidney cysts 12/23/2014  . Fast heart beat 12/23/2014  . Tubular adenoma 12/23/2014  . Vertebral compression fracture ([place]) 12/23/2014  . Avitaminosis D 12/23/2014  . A-fib ([place]) 04/08/2014  . Hypertension 08/06/2007  . EMPHYSEMA 08/06/2007  . ASTHMA 08/06/2007  . C O P D 08/06/2007    Past Medical History  Diagnosis Date  . Asthma   . COPD (chronic obstructive pulmonary disease) (HCC)     -FeV1 74% DLCO 56%  . Emphysema   . HTN (hypertension)   . Cataract  Social History   Social History  . Marital Status: Widowed    Spouse Name: N/A  . Number of Children: N/A  . Years of Education: N/A   Occupational History  . Not on file.   Social History Main Topics  . Smoking status: Former Smoker    Quit date: 07/11/2002  . Smokeless tobacco: Never Used  . Alcohol  Use: 0.6 oz/week    1 Glasses of wine per week     Comment: occasionally  . Drug Use: No  . Sexual Activity: No   Other Topics Concern  . Not on file   Social History Narrative    Allergies  Allergen Reactions  . Ciprofloxacin     Other reaction(s): Unknown  . Metoprolol     feet swelling/pain  . Macrobid  [Nitrofurantoin]   . Sulfa Antibiotics   . Sulfonamide Derivatives     Review of Systems  Constitutional: Negative.   HENT: Negative.   Eyes: Negative.   Respiratory: Negative.   Cardiovascular: Negative.   Gastrointestinal: Negative.   Genitourinary: Negative.   Musculoskeletal: Positive for joint pain.  Skin: Negative.   Neurological: Negative.   Endo/Heme/Allergies: Negative.   Psychiatric/Behavioral: Negative.     Immunization History  Administered Date(s) Administered  . Influenza, High Dose Seasonal PF 05/22/2015  . Pneumococcal Conjugate-13 07/14/2014  . Pneumococcal Polysaccharide-23 10/03/2012  . Zoster 05/23/2013   Objective:  BP 138/60 mmHg  Pulse 96  Temp(Src) 97.6 F (36.4 C) (Oral)  Resp 16  Wt 143 lb (64.864 kg)  SpO2 95%  Physical Exam  Constitutional: She is oriented to person, place, and time and well-developed, well-nourished, and in no distress.  HENT:  Head: Normocephalic and atraumatic.  Right Ear: External ear normal.  Left Ear: External ear normal.  Nose: Nose normal.  Eyes: Conjunctivae and EOM are normal. Pupils are equal, round, and reactive to light.  Neck: Normal range of motion. Neck supple.  Cardiovascular: Normal rate, regular rhythm, normal heart sounds and intact distal pulses.   Pulmonary/Chest: Effort normal and breath sounds normal.  Abdominal: Soft. Bowel sounds are normal.  Musculoskeletal: Normal range of motion.  Increased thoracic kyphosis.  Neurological: She is alert and oriented to person, place, and time. She has normal reflexes. Gait normal. GCS score is 15.  Skin: Skin is warm and dry.  Psychiatric:  Mood, memory, affect and judgment normal.    Lab Results  Component Value Date   WBC 8.9 09/04/2014   HGB 12.9 09/04/2014   HCT 40 09/04/2014   PLT 248 09/04/2014   GLUCOSE 93 04/07/2014   CHOL 204* 05/28/2013   TRIG 98 05/28/2013   HDL 51 05/28/2013   LDLCALC 133 05/28/2013   TSH 2.02 09/04/2014    CMP     Component Value Date/Time   NA 142 09/04/2014   NA 142 04/07/2014 1317   K 4.2 09/04/2014   K 4.0 04/07/2014 1317   CL 108* 04/07/2014 1317   CO2 26 04/07/2014 1317   GLUCOSE 93 04/07/2014 1317   BUN 39* 09/04/2014   BUN 30* 04/07/2014 1317   CREATININE 1.2* 09/04/2014   CREATININE 1.22 04/07/2014 1317   CALCIUM 8.9 04/07/2014 1317   PROT 6.4 04/07/2014 1317   ALBUMIN 3.5 04/07/2014 1317   AST 13 09/04/2014   AST 16 04/07/2014 1317   ALT 10 09/04/2014   ALT 19 04/07/2014 1317   ALKPHOS 105 04/07/2014 1317   BILITOT 0.4 04/07/2014 1317   GFRNONAA 45* 04/07/2014 1317  GFRNONAA 33* 12/07/2013 0452   GFRAA 54* 04/07/2014 1317   GFRAA 38* 12/07/2013 0452    Assessment and Plan :  1. Essential hypertension  2. Back pain, chronic   3. Osteoarthritis of spine, unspecified spinal osteoarthritis, unspecified spinal region Refills times 3 - HYDROcodone-acetaminophen (NORCO/VICODIN) 5-325 MG tablet; Take 1 tablet by mouth every 6 (six) hours as needed for moderate pain.  Dispense: 120 tablet; Refill: 0 4. COPD  all issues are presently stable and the patient feels pretty well.  Or than 50% this time is reviewing issues in counseling. I have done the exam and reviewed the above chart and it is accurate to the best of my knowledge.   Patient was seen and examined by Dr. Miguel Aschoff, and noted scribed by Webb Laws, Bowen MD [facility] 11/16/2015 1:43 PM

## 2015-12-03 IMAGING — CR DG CHEST 1V PORT
1 series · 1 of 1 positions shown · non-contrast
Comparison: 12/01/2012

CLINICAL DATA: Shortness of breath

EXAM:
PORTABLE CHEST - 1 VIEW

[ap]
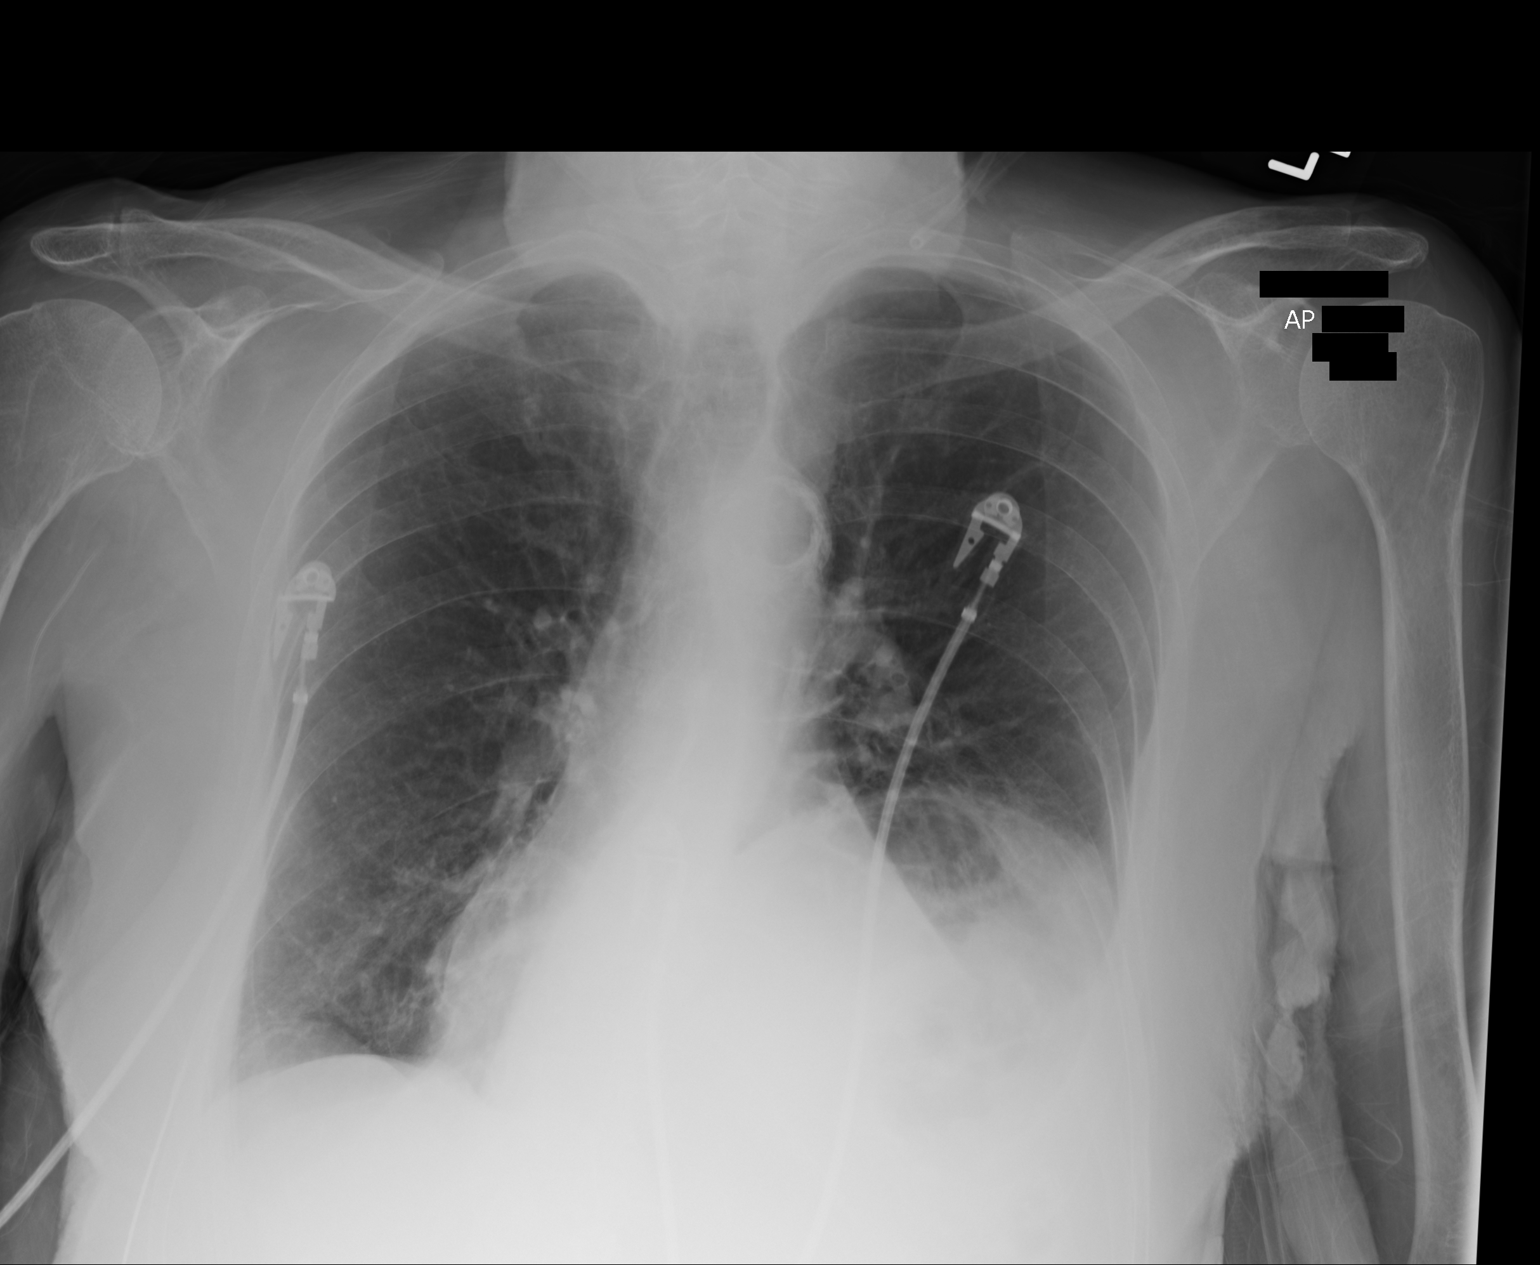

[1 of 1 positions shown; findings below may reference images not displayed]

FINDINGS: Elevation of left hemidiaphragm is again seen. The cardiac shadow is
mildly enlarged but stable. The lungs are clear. No acute bony
abnormality is noted.
IMPRESSION: No acute abnormality noted.

## 2016-01-14 ENCOUNTER — Other Ambulatory Visit: Payer: Self-pay | Admitting: Family Medicine

## 2016-02-03 ENCOUNTER — Emergency Department
Admission: EM | Admit: 2016-02-03 | Discharge: 2016-02-03 | Disposition: A | Discharge status: Home / Self Care | Payer: Medicare Other | Attending: Emergency Medicine | Admitting: Emergency Medicine

## 2016-02-03 ENCOUNTER — Emergency Department: Payer: Medicare Other

## 2016-02-03 ENCOUNTER — Encounter: Payer: Self-pay | Admitting: Emergency Medicine

## 2016-02-03 DIAGNOSIS — Z87891 Personal history of nicotine dependence: Secondary | ICD-10-CM | POA: Diagnosis not present

## 2016-02-03 DIAGNOSIS — R110 Nausea: Secondary | ICD-10-CM | POA: Insufficient documentation

## 2016-02-03 DIAGNOSIS — E785 Hyperlipidemia, unspecified: Secondary | ICD-10-CM | POA: Insufficient documentation

## 2016-02-03 DIAGNOSIS — I4891 Unspecified atrial fibrillation: Secondary | ICD-10-CM | POA: Diagnosis not present

## 2016-02-03 DIAGNOSIS — I10 Essential (primary) hypertension: Secondary | ICD-10-CM | POA: Insufficient documentation

## 2016-02-03 DIAGNOSIS — J45909 Unspecified asthma, uncomplicated: Secondary | ICD-10-CM | POA: Insufficient documentation

## 2016-02-03 DIAGNOSIS — R531 Weakness: Secondary | ICD-10-CM | POA: Diagnosis not present

## 2016-02-03 DIAGNOSIS — R630 Anorexia: Secondary | ICD-10-CM | POA: Diagnosis not present

## 2016-02-03 DIAGNOSIS — J449 Chronic obstructive pulmonary disease, unspecified: Secondary | ICD-10-CM | POA: Diagnosis not present

## 2016-02-03 DIAGNOSIS — R000 Tachycardia, unspecified: Secondary | ICD-10-CM | POA: Diagnosis not present

## 2016-02-03 LAB — BASIC METABOLIC PANEL
ANION GAP: 9 (ref 5–15)
BUN: 20 mg/dL (ref 6–20)
CALCIUM: 9.5 mg/dL (ref 8.9–10.3)
CO2: 25 mmol/L (ref 22–32)
Chloride: 108 mmol/L (ref 101–111)
Creatinine, Ser: 1.2 mg/dL — ABNORMAL HIGH (ref 0.44–1.00)
GFR calc Af Amer: 47 mL/min — ABNORMAL LOW (ref >60)
GFR, EST NON AFRICAN AMERICAN: 40 mL/min — AB (ref >60)
GLUCOSE: 106 mg/dL — AB (ref 65–99)
Potassium: 3.8 mmol/L (ref 3.5–5.1)
Sodium: 142 mmol/L (ref 135–145)

## 2016-02-03 LAB — URINALYSIS COMPLETE WITH MICROSCOPIC (ARMC ONLY)
BILIRUBIN URINE: NEGATIVE
Bacteria, UA: NONE SEEN
GLUCOSE, UA: NEGATIVE mg/dL
Ketones, ur: NEGATIVE mg/dL
Leukocytes, UA: NEGATIVE
NITRITE: NEGATIVE
PH: 6 (ref 5.0–8.0)
Protein, ur: NEGATIVE mg/dL
Specific Gravity, Urine: 1.014 (ref 1.005–1.030)

## 2016-02-03 LAB — HEPATIC FUNCTION PANEL
ALBUMIN: 4.4 g/dL (ref 3.5–5.0)
ALK PHOS: 67 U/L (ref 38–126)
ALT: 10 U/L — ABNORMAL LOW (ref 14–54)
AST: 17 U/L (ref 15–41)
BILIRUBIN TOTAL: 0.5 mg/dL (ref 0.3–1.2)
Bilirubin, Direct: <0.1 mg/dL — ABNORMAL LOW (ref 0.1–0.5)
TOTAL PROTEIN: 6.9 g/dL (ref 6.5–8.1)

## 2016-02-03 LAB — CBC
HCT: 39.5 % (ref 35.0–47.0)
HEMOGLOBIN: 13.5 g/dL (ref 12.0–16.0)
MCH: 31.4 pg (ref 26.0–34.0)
MCHC: 34.2 g/dL (ref 32.0–36.0)
MCV: 91.8 fL (ref 80.0–100.0)
Platelets: 198 10*3/uL (ref 150–440)
RBC: 4.3 MIL/uL (ref 3.80–5.20)
RDW: 14.2 % (ref 11.5–14.5)
WBC: 5.7 10*3/uL (ref 3.6–11.0)

## 2016-02-03 LAB — LIPASE, BLOOD: Lipase: 19 U/L (ref 11–51)

## 2016-02-03 LAB — TROPONIN I: Troponin I: <0.03 ng/mL (ref <0.03)

## 2016-02-03 MED ORDER — ONDANSETRON HCL 4 MG/2ML IJ SOLN
4.0000 mg | Freq: Once | INTRAMUSCULAR | Status: AC
  Administered 2016-02-03: 4 mg via INTRAVENOUS
  Filled 2016-02-03: qty 2

## 2016-02-03 MED ORDER — ONDANSETRON HCL 4 MG PO TABS: 4.0000 mg | ORAL_TABLET | Freq: Every day | ORAL | 0 refills | Status: DC | PRN

## 2016-02-03 MED ORDER — SODIUM CHLORIDE 0.9 % IV BOLUS (SEPSIS)
1000.0000 mL | Freq: Once | INTRAVENOUS | Status: AC
  Administered 2016-02-03: 1000 mL via INTRAVENOUS

## 2016-02-03 NOTE — ED Triage Notes (Signed)
Pt c/o "not feeling well". Reports decreased PO intake, nausea, and slight abdominal pain.

## 2016-02-03 NOTE — ED Provider Notes (Signed)
[facility] Emergency Department Provider Note  ____________________________________________  Time seen: Approximately 11:16 AM  I have reviewed the triage vital signs and the nursing notes.   HISTORY  Chief Complaint Fatigue and Nausea   HPI [name] [name] is a 82 y.o. female a history of COPD, PUD, cholelithiasis, and hypertension who presents for evaluation of nausea. Patient reports that she has had anorexia and has felt extremely fatigued for the last 2 days. This morning she had severe nausea which prompted her visit to the emergency department. She denies headache, neck stiffness, changes in vision, sore throat, congestion, fever, chest pain, changes to her chronic shortness of breath, cough, abdominal pain, vomiting, diarrhea, dysuria, rash. She reports normal bowel movements. She denies abdominal distention. She had her last bowel movement was this morning area and she denies melena. She is passing flatus. She reports she hasn't eaten and drank in the last 2 days because she didn't feel like it.  Past Medical History:  Diagnosis Date  . Asthma   . Cataract   . COPD (chronic obstructive pulmonary disease) (HCC)    -FeV1 74% DLCO 56%  . Emphysema   . HTN (hypertension)     Patient Active Problem List   Diagnosis Date Noted  . Pulmonary emphysema ([place]) 11/16/2015  . Chronic obstructive pulmonary emphysema ([place]) 12/24/2014  . Allergic rhinitis 12/23/2014  . Back pain, chronic 12/23/2014  . Barrett's esophagus with low grade dysplasia 12/23/2014  . Aneurysm, cerebral 12/23/2014  . CAFL (chronic airflow limitation) ([place]) 12/23/2014  . Cognitive decline 12/23/2014  . Essential (primary) hypertension 12/23/2014  . Fatigue 12/23/2014  . Acid reflux 12/23/2014  . H/O peptic ulcer 12/23/2014  . History of biliary disease 12/23/2014  . HLD (hyperlipidemia) 12/23/2014  . Insomnia related to another mental disorder 12/23/2014  . Arthritis,  degenerative 12/23/2014  . OP (osteoporosis) 12/23/2014  . Plantar fasciitis 12/23/2014  . Hypercholesterolemia without hypertriglyceridemia 12/23/2014  . Peptic ulcer 12/23/2014  . Kidney cysts 12/23/2014  . Fast heart beat 12/23/2014  . Tubular adenoma 12/23/2014  . Vertebral compression fracture ([place]) 12/23/2014  . Avitaminosis D 12/23/2014  . A-fib ([place]) 04/08/2014  . Hypertension 08/06/2007  . EMPHYSEMA 08/06/2007  . ASTHMA 08/06/2007  . C O P D 08/06/2007    Past Surgical History:  Procedure Laterality Date  . ankle surgery with implants Left 1994  . Bening Tumor Removal from Thyroid   1950's  . EYE SURGERY  2014   catarac  . HERNIA REPAIR  1960's  . Lacrimal gland surgery  2000  . lumbar spine fracture and ankle fracture    . Right hip surgery with implants   1996   and LEFT Hip  . status post nasal miacalcin    . TONSILLECTOMY AND ADENOIDECTOMY  1950  . TOTAL HIP ARTHROPLASTY Bilateral     Current Outpatient Rx  . Order #: [id number] Class: Normal  . Order #: [id number] Class: Historical Med  . Order #: [id number] Class: Historical Med  . Order #: [id number] Class: Print  . Order #: [id number] Class: Normal  . Order #: [id number] Class: Print  . Order #: [id number] Class: Normal  . Order #: [id number] Class: Historical Med    Allergies Ciprofloxacin; Macrobid  [nitrofurantoin]; Metoprolol; Sulfa antibiotics; and Sulfonamide derivatives  Family History  Problem Relation Age of Onset  . Ovarian cancer Mother   . Heart attack Father   . Heart disease Brother   . Asthma Other     Social History Social History  Substance  Use Topics  . Smoking status: Former Smoker    Quit date: 07/11/2002  . Smokeless tobacco: Never Used  . Alcohol use 0.6 oz/week    1 Glasses of wine per week     Comment: occasionally    Review of Systems  Constitutional: Negative for fever. Eyes: Negative for visual changes. ENT: Negative for sore throat. Cardiovascular: Negative for chest  pain. Respiratory: Negative for shortness of breath. Gastrointestinal: Negative for abdominal pain, vomiting or diarrhea. + nausea and anorexia Genitourinary: Negative for dysuria. Musculoskeletal: Negative for back pain. Skin: Negative for rash. Neurological: Negative for headaches, weakness or numbness.  ____________________________________________   PHYSICAL EXAM:  VITAL SIGNS: ED Triage Vitals  Enc Vitals Group     BP 02/03/16 0929 (!) 147/84     Pulse Rate 02/03/16 0929 (!) 103     Resp 02/03/16 0929 16     Temp 02/03/16 0929 97.9 F (36.6 C)     Temp Source 02/03/16 0929 Oral     SpO2 02/03/16 0929 93 %     Weight 02/03/16 0929 140 lb (63.5 kg)     Height 02/03/16 0929 '5\' 3"'$  (1.6 m)     Head Circumference --      Peak Flow --      Pain Score 02/03/16 0924 2     Pain Loc --      Pain Edu? --      Excl. in [place]? --     Constitutional: Alert and oriented. Well appearing and in no apparent distress. HEENT:      Head: Normocephalic and atraumatic.         Eyes: Conjunctivae are normal. Sclera is non-icteric. EOMI. PERRL      Mouth/Throat: Mucous membranes are moist.       Neck: Supple with no signs of meningismus. Cardiovascular: Tachycardic with regular rhythm. No murmurs, gallops, or rubs. 2+ symmetrical distal pulses are present in all extremities. No JVD. Respiratory: Normal respiratory effort. Lungs are clear to auscultation bilaterally. No wheezes, crackles, or rhonchi.  Gastrointestinal: Soft, non tender, and non distended with positive bowel sounds. No rebound or guarding. Genitourinary: No CVA tenderness. Musculoskeletal: Nontender with normal range of motion in all extremities. No edema, cyanosis, or erythema of extremities. Neurologic: Normal speech and language. Face is symmetric. Moving all extremities. No gross focal neurologic deficits are appreciated. Skin: Skin is warm, dry and intact. No rash noted. Psychiatric: Mood and affect are normal. Speech and  behavior are normal.  ____________________________________________   LABS (all labs ordered are listed, but only abnormal results are displayed)  Labs Reviewed  BASIC METABOLIC PANEL - Abnormal; Notable for the following:       Result Value   Glucose, Bld 106 (*)    Creatinine, Ser 1.20 (*)    GFR calc non Af Amer 40 (*)    GFR calc Af Amer 47 (*)    All other components within normal limits  URINALYSIS COMPLETEWITH MICROSCOPIC ([facility] ONLY) - Abnormal; Notable for the following:    Color, Urine YELLOW (*)    APPearance HAZY (*)    Hgb urine dipstick 1+ (*)    Squamous Epithelial / LPF 0-5 (*)    All other components within normal limits  HEPATIC FUNCTION PANEL - Abnormal; Notable for the following:    ALT 10 (*)    Bilirubin, Direct <0.1 (*)    All other components within normal limits  CBC  LIPASE, BLOOD  TROPONIN I  TROPONIN I  CBG MONITORING, ED   ____________________________________________  EKG  ED ECG REPORT I, Rudene Re, the attending physician, personally viewed and interpreted this ECG.  Sinus tachycardia, rate of 106, normal intervals, normal axis, T-wave inversions on one in aVL, no ST elevations. Unchanged from prior from 2015. ____________________________________________  RADIOLOGY  KUB: negative CXR: negative  ____________________________________________   PROCEDURES  Procedure(s) performed: None Procedures Critical Care performed:  None ____________________________________________   INITIAL IMPRESSION / ASSESSMENT AND PLAN / ED COURSE  82 y.o. female a history of COPD, PUD, cholelithiasis, and hypertension who presents for evaluation of 2 days of anorexia fatigue and nausea starting today. Patient has no other complaints, her physical exam is otherwise within normal limits. She is mildly tachycardic with heart rate of 103. EKG is nonischemic. We'll check CBC, CMP, lipase, chest x-ray, KUB, troponin. We'll give her IV fluids and IV  Zofran.  Clinical Course  Comment By Time  Patient reports she feels markedly improved. She is actually craving tomato sandwich which is the first time in 3 days she's had hunger. She is tolerating by mouth here. Her workup was negative including CBC, CMP, UA, chest x-ray, troponin 2, EKG, KUB. We'll discharge her home at this time with close follow-up with her primary care doctor.  I discussed my evaluation of the patient's symptoms, my clinical impression, and my proposed outpatient treatment plan with patient/ family members. We have discussed anticipatory guidance, scheduled follow-up, and careful return precautions. The patient expresses understanding and is comfortable with the discharge plan. All patient's questions were answered.  Rudene Re, MD 07/26 1336    Pertinent labs & imaging results that were available during my care of the patient were reviewed by me and considered in my medical decision making (see chart for details).    ____________________________________________   FINAL CLINICAL IMPRESSION(S) / ED DIAGNOSES  Final diagnoses:  Nausea  Anorexia      NEW MEDICATIONS STARTED DURING THIS VISIT:  New Prescriptions   ONDANSETRON (ZOFRAN) 4 MG TABLET    Take 1 tablet (4 mg total) by mouth daily as needed for nausea or vomiting.     Note:  This document was prepared using Dragon voice recognition software and may include unintentional dictation errors.    Rudene Re, MD 02/03/16 1340

## 2016-02-03 NOTE — ED Triage Notes (Signed)
Pt reports feels like last time she had bacterial infection.

## 2016-02-03 NOTE — ED Notes (Signed)
Patient transported to X-ray 

## 2016-04-21 ENCOUNTER — Telehealth: Payer: Self-pay | Admitting: Family Medicine

## 2016-04-21 ENCOUNTER — Telehealth: Payer: Self-pay

## 2016-04-21 NOTE — Telephone Encounter (Signed)
Called Pt to schedule AWV with [facility] only for next week knb

## 2016-04-21 NOTE — Telephone Encounter (Signed)
Patient returned your call. I advised patient of the reason for your call. She has additional questions and wants to speak to you. She states that she is already scheduled to see Dr. Rosanna Randy for her AWV on 05/18/2016 and wants to know if she scheduled the AWV for next week if she could cancel the other appointment. Please call patient back at [phone]

## 2016-04-25 NOTE — Telephone Encounter (Signed)
Spoke with pt and explained the AWV, she will try this one time but was a little skeptical. - knb

## 2016-05-18 ENCOUNTER — Ambulatory Visit (INDEPENDENT_AMBULATORY_CARE_PROVIDER_SITE_OTHER): Payer: Medicare Other | Admitting: Family Medicine

## 2016-05-18 ENCOUNTER — Encounter: Payer: Self-pay | Admitting: Family Medicine

## 2016-05-18 ENCOUNTER — Ambulatory Visit: Payer: Self-pay | Admitting: Family Medicine

## 2016-05-18 VITALS — BP 128/66 | HR 92 | Temp 97.8°F | Wt 141.8 lb

## 2016-05-18 VITALS — BP 128/66 | HR 92 | Temp 97.8°F | Ht 61.5 in | Wt 141.5 lb

## 2016-05-18 DIAGNOSIS — G8929 Other chronic pain: Secondary | ICD-10-CM | POA: Diagnosis not present

## 2016-05-18 DIAGNOSIS — I10 Essential (primary) hypertension: Secondary | ICD-10-CM | POA: Diagnosis not present

## 2016-05-18 DIAGNOSIS — M545 Low back pain: Secondary | ICD-10-CM

## 2016-05-18 DIAGNOSIS — M479 Spondylosis, unspecified: Secondary | ICD-10-CM

## 2016-05-18 DIAGNOSIS — Z23 Encounter for immunization: Secondary | ICD-10-CM

## 2016-05-18 DIAGNOSIS — K22710 Barrett's esophagus with low grade dysplasia: Secondary | ICD-10-CM | POA: Diagnosis not present

## 2016-05-18 DIAGNOSIS — Z0000 Encounter for general adult medical examination without abnormal findings: Secondary | ICD-10-CM

## 2016-05-18 MED ORDER — HYDROCODONE-ACETAMINOPHEN 5-325 MG PO TABS: 1.0000 | ORAL_TABLET | Freq: Four times a day (QID) | ORAL | 0 refills | Status: DC | PRN

## 2016-05-18 MED ORDER — ONDANSETRON HCL 4 MG PO TABS: 4.0000 mg | ORAL_TABLET | Freq: Every day | ORAL | 3 refills | Status: DC | PRN

## 2016-05-18 NOTE — Patient Instructions (Signed)
[name] [name] , Thank you for taking time to come for your Medicare Wellness Visit. I appreciate your ongoing commitment to your health goals. Please review the following plan we discussed and let me know if I can assist you in the future.   These are the goals we discussed: Goals    . Increase water intake          Starting 05/18/16, I will increase my water intake to 2 glasses a day.       This is a list of the screening recommended for you and due dates:  Health Maintenance  Topic Date Due  . Flu Shot  06/14/2016*  . Tetanus Vaccine  05/18/2026*  . DEXA scan (bone density measurement)  Completed  . Shingles Vaccine  Completed  . Pneumonia vaccines  Completed  *Topic was postponed. The date shown is not the original due date.   Preventive Care for Adults  A healthy lifestyle and preventive care can promote health and wellness. Preventive health guidelines for adults include the following key practices.  . A routine yearly physical is a good way to check with your health care provider about your health and preventive screening. It is a chance to share any concerns and updates on your health and to receive a thorough exam.  . Visit your dentist for a routine exam and preventive care every 6 months. Brush your teeth twice a day and floss once a day. Good oral hygiene prevents tooth decay and gum disease.  . The frequency of eye exams is based on your age, health, family medical history, use  of contact lenses, and other factors. Follow your health care provider's ecommendations for frequency of eye exams.  . Eat a healthy diet. Foods like vegetables, fruits, whole grains, low-fat dairy products, and lean protein foods contain the nutrients you need without too many calories. Decrease your intake of foods high in solid fats, added sugars, and salt. Eat the right amount of calories for you. Get information about a proper diet from your health care provider, if necessary.  . Regular  physical exercise is one of the most important things you can do for your health. Most adults should get at least 150 minutes of moderate-intensity exercise (any activity that increases your heart rate and causes you to sweat) each week. In addition, most adults need muscle-strengthening exercises on 2 or more days a week.  Silver Sneakers may be a benefit available to you. To determine eligibility, you may visit the website: www.silversneakers.com or contact program at [phone] Mon-Fri between 8AM-8PM.   . Maintain a healthy weight. The body mass index (BMI) is a screening tool to identify possible weight problems. It provides an estimate of body fat based on height and weight. Your health care provider can find your BMI and can help you achieve or maintain a healthy weight.   For adults 20 years and older: ? A BMI below 18.5 is considered underweight. ? A BMI of 18.5 to 24.9 is normal. ? A BMI of 25 to 29.9 is considered overweight. ? A BMI of 30 and above is considered obese.   . Maintain normal blood lipids and cholesterol levels by exercising and minimizing your intake of saturated fat. Eat a balanced diet with plenty of fruit and vegetables. Blood tests for lipids and cholesterol should begin at age 65 and be repeated every 5 years. If your lipid or cholesterol levels are high, you are over 50, or you are at high risk  for heart disease, you may need your cholesterol levels checked more frequently. Ongoing high lipid and cholesterol levels should be treated with medicines if diet and exercise are not working.  . If you smoke, find out from your health care provider how to quit. If you do not use tobacco, please do not start.  . If you choose to drink alcohol, please do not consume more than 2 drinks per day. One drink is considered to be 12 ounces (355 mL) of beer, 5 ounces (148 mL) of wine, or 1.5 ounces (44 mL) of liquor.  . If you are 80-7 years old, ask your health care provider if  you should take aspirin to prevent strokes.  . Use sunscreen. Apply sunscreen liberally and repeatedly throughout the day. You should seek shade when your shadow is shorter than you. Protect yourself by wearing long sleeves, pants, a wide-brimmed hat, and sunglasses year round, whenever you are outdoors.  . Once a month, do a whole body skin exam, using a mirror to look at the skin on your back. Tell your health care provider of new moles, moles that have irregular borders, moles that are larger than a pencil eraser, or moles that have changed in shape or color.

## 2016-05-18 NOTE — Progress Notes (Signed)
Subjective:   [name] [name] is a 82 y.o. female who presents for Medicare Annual (Subsequent) preventive examination.  Review of Systems:  N/A Cardiac Risk Factors include: advanced age (>22mn, >>31women);dyslipidemia;hypertension     Objective:     Vitals: Pulse (!) 104   Temp 97.8 F (36.6 C) (Oral)   Ht 5' 1.5" (1.562 m)   Wt 141 lb 8 oz (64.2 kg)   BMI 26.30 kg/m   Body mass index is 26.3 kg/m.   Tobacco History  Smoking Status  . Former Smoker  . Quit date: 07/11/2002  Smokeless Tobacco  . Never Used     Counseling given: Not Answered   Past Medical History:  Diagnosis Date  . Asthma   . Cataract   . COPD (chronic obstructive pulmonary disease) (HCC)    -FeV1 74% DLCO 56%  . Emphysema   . HTN (hypertension)    Past Surgical History:  Procedure Laterality Date  . ankle surgery with implants Left 1994  . Bening Tumor Removal from Thyroid   1950's  . EYE SURGERY  2014   catarac  . HERNIA REPAIR  1960's  . Lacrimal gland surgery  2000  . lumbar spine fracture and ankle fracture    . Right hip surgery with implants   1996   and LEFT Hip  . status post nasal miacalcin    . TONSILLECTOMY AND ADENOIDECTOMY  1950  . TOTAL HIP ARTHROPLASTY Bilateral    Family History  Problem Relation Age of Onset  . Ovarian cancer Mother   . Heart attack Father   . Heart disease Brother   . Asthma Other    History  Sexual Activity  . Sexual activity: No    Outpatient Encounter Prescriptions as of 05/18/2016  Medication Sig  . ADVAIR DISKUS 250-50 MCG/DOSE AEPB USE ONE ORAL INHALATION EVERY 12 HOURS BREATHE IN STEADILY AND DEEPLYRINSE MOUTH AFTER USE  . HYDROcodone-acetaminophen (NORCO/VICODIN) 5-325 MG tablet Take 1 tablet by mouth every 6 (six) hours as needed for moderate pain. (Patient taking differently: Take 1 tablet by mouth every 6 (six) hours as needed for moderate pain. )  . losartan-hydrochlorothiazide (HYZAAR) 100-25 MG tablet TAKE 1 TABLET EVERY  DAY USUALLY IN THE MORNING  . Multiple Vitamins-Calcium (VIACTIV MULTI-VITAMIN) CHEW Chew 1 tablet by mouth 2 (two) times daily.  . ondansetron (ZOFRAN) 4 MG tablet Take 1 tablet (4 mg total) by mouth daily as needed for nausea or vomiting.  . pantoprazole (PROTONIX) 40 MG tablet TAKE 1 TABLET EVERY DAY  . Probiotic Product (PROBIOTIC DAILY) CAPS Take by mouth.  . calcium carbonate (OS-CAL) 600 MG TABS tablet Take by mouth. Reported on 11/16/2015  . Cholecalciferol 1000 UNITS capsule Take by mouth.   No facility-administered encounter medications on file as of 05/18/2016.     Activities of Daily Living In your present state of health, do you have any difficulty performing the following activities: 05/18/2016  Hearing? Y  Vision? N  Difficulty concentrating or making decisions? Y  Walking or climbing stairs? Y  Dressing or bathing? N  Doing errands, shopping? N  Preparing Food and eating ? N  Using the Toilet? N  In the past six months, have you accidently leaked urine? Y  Do you have problems with loss of bowel control? N  Managing your Medications? N  Managing your Finances? N  Housekeeping or managing your Housekeeping? Y  Some recent data might be hidden    Patient Care Team: RDelfino Lovett  Carlean Jews Cranford Mon., MD as PCP - General (Family Medicine) Birder Robson, MD as Referring Physician (Ophthalmology) Ralene Bathe, MD as Consulting Physician (Dermatology)    Assessment:     Exercise Activities and Dietary recommendations Current Exercise Habits: The patient does not participate in regular exercise at present, Exercise limited by: orthopedic condition(s)  Goals    . Increase water intake          Starting 05/18/16, I will increase my water intake to 2 glasses a day.      Fall Risk Fall Risk  05/18/2016 02/18/2015  Falls in the past year? No No   Depression Screen PHQ 2/9 Scores 05/18/2016 02/18/2015  PHQ - 2 Score 0 0     Cognitive Function     6CIT Screen 05/18/2016    What Year? 0 points  What month? 0 points  What time? 0 points  Count back from 20 0 points  Months in reverse 0 points  Repeat phrase 0 points  Total Score 0    Immunization History  Administered Date(s) Administered  . Influenza, High Dose Seasonal PF 05/22/2015  . Pneumococcal Conjugate-13 07/14/2014  . Pneumococcal Polysaccharide-23 10/03/2012  . Zoster 05/23/2013   Screening Tests Health Maintenance  Topic Date Due  . INFLUENZA VACCINE  06/14/2016 (Originally 02/09/2016)  . TETANUS/TDAP  05/18/2026 (Originally 01/12/1949)  . DEXA SCAN  Completed  . ZOSTAVAX  Completed  . PNA vac Low Risk Adult  Completed      Plan:  I have personally reviewed and addressed the Medicare Annual Wellness questionnaire and have noted the following in the patient's chart:  A. Medical and social history B. Use of alcohol, tobacco or illicit drugs  C. Current medications and supplements D. Functional ability and status E.  Nutritional status F.  Physical activity G. Advance directives H. List of other physicians I.  Hospitalizations, surgeries, and ER visits in previous 12 months J.  [facility] such as hearing and vision if needed, cognitive and depression L. Referrals and appointments - none  In addition, I have reviewed and discussed with patient certain preventive protocols, quality metrics, and best practice recommendations. A written personalized care plan for preventive services as well as general preventive health recommendations were provided to patient.  See attached scanned questionnaire for additional information.   Signed,  Fabio Neighbors, LPN Nurse Health Advisor  I have reviewed the information as  put it by [facility] LPN and was available for any questions or concerns. Miguel Aschoff MD [facility]

## 2016-05-18 NOTE — Progress Notes (Signed)
Patient: [name] [name] Female    DOB: 11-06-1929   82 y.o.   MRN: [id number] Visit Date: 05/18/2016  Today's Provider: Wilhemena Durie, MD   Chief Complaint  Patient presents with  . Hypertension  . Back Pain  . Osteoarthritis   Subjective:    HPI  Hypertension, follow-up:  BP Readings from Last 3 Encounters:  05/18/16 128/66  05/18/16 128/66  02/03/16 117/73   She was last seen for hypertension 4 months ago.  BP at that visit was 117/73. Management since that visit includes none. She reports good compliance with treatment. She is not having side effects.  She is not exercising. She is adherent to low salt diet.   Outside blood pressures are not being checked. She is experiencing dyspnea. This is base line for pt.  Patient denies chest pain, chest pressure/discomfort, claudication, exertional chest pressure/discomfort, fatigue, irregular heart beat, lower extremity edema, near-syncope, orthopnea, palpitations and paroxysmal nocturnal dyspnea.     Weight trend: stable Wt Readings from Last 3 Encounters:  05/18/16 141 lb 12.8 oz (64.3 kg)  05/18/16 141 lb 8 oz (64.2 kg)  02/03/16 140 lb (63.5 kg)      ------------------------------------------------------------------------ Chronic back pain: 4 month follow up. Stable. Taking Hydrocodone only at nightime. Patient reports she is taking 1-2 tablets most nights only 1 tablet.  COPD: Stable. Patient reports dyspnea with cold air and exertion.     Previous Medications   ADVAIR DISKUS 250-50 MCG/DOSE AEPB    USE ONE ORAL INHALATION EVERY 12 HOURS BREATHE IN STEADILY AND DEEPLYRINSE MOUTH AFTER USE   CALCIUM CARBONATE (OS-CAL) 600 MG TABS TABLET    Take by mouth. Reported on 11/16/2015   CHOLECALCIFEROL 1000 UNITS CAPSULE    Take by mouth.   LOSARTAN-HYDROCHLOROTHIAZIDE (HYZAAR) 100-25 MG TABLET    TAKE 1 TABLET EVERY DAY USUALLY IN THE MORNING   MULTIPLE VITAMINS-CALCIUM (VIACTIV MULTI-VITAMIN) CHEW    Chew 1 tablet  by mouth 2 (two) times daily.   PANTOPRAZOLE (PROTONIX) 40 MG TABLET    TAKE 1 TABLET EVERY DAY   PROBIOTIC PRODUCT (PROBIOTIC DAILY) CAPS    Take by mouth.    Review of Systems  Constitutional: Negative.   Respiratory: Negative.   Cardiovascular: Negative.   Musculoskeletal: Positive for arthralgias and back pain.    Social History  Substance Use Topics  . Smoking status: Former Smoker    Quit date: 07/11/2002  . Smokeless tobacco: Never Used  . Alcohol use 0.6 oz/week    1 Glasses of wine per week     Comment: occasionally   Objective:   BP 128/66 (BP Location: Right Arm, Patient Position: Sitting, Cuff Size: Normal)   Pulse 92   Temp 97.8 F (36.6 C) (Oral)   Wt 141 lb 12.8 oz (64.3 kg)   BMI 26.36 kg/m   Physical Exam  Constitutional: She is oriented to person, place, and time. She appears well-developed and well-nourished.  HENT:  Head: Normocephalic and atraumatic.  Right Ear: External ear normal.  Left Ear: External ear normal.  Mouth/Throat: Oropharynx is clear and moist.  Eyes: Conjunctivae and EOM are normal. Pupils are equal, round, and reactive to light.  Neck: Normal range of motion. Neck supple.  Cardiovascular: Normal rate, regular rhythm, normal heart sounds and intact distal pulses.   Pulmonary/Chest: Effort normal and breath sounds normal.  Abdominal: Soft. Bowel sounds are normal.  Musculoskeletal: Normal range of motion.  Neurological: She is alert and oriented to person, place,  and time. She has normal reflexes.  Skin: Skin is warm and dry.  Psychiatric: She has a normal mood and affect. Her behavior is normal. Judgment and thought content normal.        Assessment & Plan:     1. Essential (primary) hypertension Stable. Continue medications  2. Chronic low back pain, unspecified back pain laterality, with sciatica presence unspecified Stable. 3 months Hydrocodone RX given to patient.   3. Osteoarthritis of spine, unspecified spinal  osteoarthritis complication status, unspecified spinal region Stable. 3 months Hydrocodone RX given to patient.    4. Barrett's esophagus with low grade dysplasia Stable.    Follow up: Return in about 6 months (around 11/15/2016).     I have done the exam and reviewed the chart and it is accurate to the best of my knowledge. Development worker, community has been used and  any errors in dictation or transcription are unintentional. Miguel Aschoff M.D. [facility]

## 2016-11-09 ENCOUNTER — Other Ambulatory Visit: Payer: Self-pay | Admitting: Family Medicine

## 2016-11-15 ENCOUNTER — Encounter: Payer: Self-pay | Admitting: Family Medicine

## 2016-11-15 ENCOUNTER — Ambulatory Visit (INDEPENDENT_AMBULATORY_CARE_PROVIDER_SITE_OTHER): Payer: Medicare Other | Admitting: Family Medicine

## 2016-11-15 VITALS — BP 140/62 | HR 82 | Temp 97.5°F | Resp 16 | Wt 141.0 lb

## 2016-11-15 DIAGNOSIS — I10 Essential (primary) hypertension: Secondary | ICD-10-CM

## 2016-11-15 DIAGNOSIS — M545 Low back pain: Secondary | ICD-10-CM

## 2016-11-15 DIAGNOSIS — M479 Spondylosis, unspecified: Secondary | ICD-10-CM | POA: Diagnosis not present

## 2016-11-15 DIAGNOSIS — G8929 Other chronic pain: Secondary | ICD-10-CM

## 2016-11-15 MED ORDER — HYDROCODONE-ACETAMINOPHEN 5-325 MG PO TABS: 1.0000 | ORAL_TABLET | Freq: Four times a day (QID) | ORAL | 0 refills | Status: DC | PRN

## 2016-11-15 NOTE — Progress Notes (Signed)
Subjective:  HPI  Hypertension, follow-up:  BP Readings from Last 3 Encounters:  11/15/16 140/62  05/18/16 128/66  05/18/16 128/66    She was last seen for hypertension 6 months ago.  BP at that visit was 128/66. Management since that visit includes none. She reports good compliance with treatment. She is not having side effects.  She is not exercising. She is adherent to low salt diet.   Outside blood pressures are being checked. She is experiencing none.  Patient denies chest pain, chest pressure/discomfort, claudication, dyspnea, exertional chest pressure/discomfort, fatigue, irregular heart beat, lower extremity edema, near-syncope, orthopnea, palpitations, paroxysmal nocturnal dyspnea, syncope and tachypnea.   Cardiovascular risk factors include advanced age (older than 103 for men, 38 for women), dyslipidemia and hypertension.   Wt Readings from Last 3 Encounters:  11/15/16 141 lb (64 kg)  05/18/16 141 lb 12.8 oz (64.3 kg)  05/18/16 141 lb 8 oz (64.2 kg)   ------------------------------------------------------------------------  Pt reports that her pain is stable. She take at most 2 pain pills a day. One usually at supper time when her back and legs start hurting from being up all day and sometimes one in the middle of the night if she gets up with leg pain. But this only happens about 2-3 times a week.    Prior to Admission medications   Medication Sig Start Date End Date Taking? Authorizing Provider  ADVAIR DISKUS 250-50 MCG/DOSE AEPB USE ONE ORAL INHALATION EVERY 12 HOURS BREATHE IN STEADILY AND DEEPLYRINSE MOUTH AFTER USE 10/19/15   Jerrol Banana., MD  calcium carbonate (OS-CAL) 600 MG TABS tablet Take by mouth. Reported on 11/16/2015    [provider]  Cholecalciferol 1000 UNITS capsule Take by mouth.    [provider]  HYDROcodone-acetaminophen (NORCO/VICODIN) 5-325 MG tablet Take 1 tablet by mouth every 6 (six) hours as needed for  moderate pain. 05/18/16   Jerrol Banana., MD  losartan-hydrochlorothiazide [facility]) 100-25 MG tablet TAKE 1 TABLET BY MOUTH DAILY USUALLY IN THE [facility] 11/09/16   Jerrol Banana., MD  Multiple Vitamins-Calcium (VIACTIV MULTI-VITAMIN) CHEW Chew 1 tablet by mouth 2 (two) times daily.    [provider]  ondansetron (ZOFRAN) 4 MG tablet Take 1 tablet (4 mg total) by mouth daily as needed for nausea or vomiting. 05/18/16 05/18/17  Jerrol Banana., MD  pantoprazole (PROTONIX) 40 MG tablet TAKE 1 TABLET EVERY DAY 01/14/16   Jerrol Banana., MD  Probiotic Product (PROBIOTIC DAILY) CAPS Take by mouth.    [provider]    Patient Active Problem List   Diagnosis Date Noted  . Pulmonary emphysema ([place]) 11/16/2015  . Chronic obstructive pulmonary emphysema ([place]) 12/24/2014  . Allergic rhinitis 12/23/2014  . Back pain, chronic 12/23/2014  . Barrett's esophagus with low grade dysplasia 12/23/2014  . Aneurysm, cerebral 12/23/2014  . CAFL (chronic airflow limitation) ([place]) 12/23/2014  . Cognitive decline 12/23/2014  . Essential (primary) hypertension 12/23/2014  . Fatigue 12/23/2014  . Acid reflux 12/23/2014  . H/O peptic ulcer 12/23/2014  . History of biliary disease 12/23/2014  . HLD (hyperlipidemia) 12/23/2014  . Insomnia related to another mental disorder 12/23/2014  . Arthritis, degenerative 12/23/2014  . OP (osteoporosis) 12/23/2014  . Plantar fasciitis 12/23/2014  . Hypercholesterolemia without hypertriglyceridemia 12/23/2014  . Peptic ulcer 12/23/2014  . Kidney cysts 12/23/2014  . Fast heart beat 12/23/2014  . Tubular adenoma 12/23/2014  . Vertebral compression fracture ([place]) 12/23/2014  . Avitaminosis  D 12/23/2014  . A-fib ([place]) 04/08/2014  . Hypertension 08/06/2007  . EMPHYSEMA 08/06/2007  . ASTHMA 08/06/2007  . COPD (chronic obstructive pulmonary disease) ([place]) 08/06/2007    Past Medical History:  Diagnosis Date  . Asthma   .  Cataract   . COPD (chronic obstructive pulmonary disease) (HCC)    -FeV1 74% DLCO 56%  . Emphysema   . HTN (hypertension)     Social History   Social History  . Marital status: Widowed    Spouse name: N/A  . Number of children: N/A  . Years of education: N/A   Occupational History  . Not on file.   Social History Main Topics  . Smoking status: Former Smoker    Quit date: 07/11/2002  . Smokeless tobacco: Never Used  . Alcohol use 0.6 oz/week    1 Glasses of wine per week     Comment: occasionally  . Drug use: No  . Sexual activity: No   Other Topics Concern  . Not on file   Social History Narrative  . No narrative on file    Allergies  Allergen Reactions  . Ciprofloxacin     Other reaction(s): Unknown  . Macrobid  [Nitrofurantoin]   . Metoprolol     feet swelling/pain  . Sulfa Antibiotics   . Sulfonamide Derivatives     Review of Systems  Constitutional: Negative.   HENT: Negative.   Eyes: Negative.   Respiratory: Negative.   Cardiovascular: Negative.   Gastrointestinal: Negative.   Genitourinary: Negative.   Musculoskeletal: Positive for back pain and joint pain.  Skin: Negative.   Neurological: Negative.   Endo/Heme/Allergies: Negative.   Psychiatric/Behavioral: Negative.     Immunization History  Administered Date(s) Administered  . Influenza, High Dose Seasonal PF 05/22/2015, 05/18/2016  . Pneumococcal Conjugate-13 07/14/2014  . Pneumococcal Polysaccharide-23 10/03/2012  . Zoster 05/23/2013    Objective:  BP 140/62 (BP Location: Left Arm, Patient Position: Sitting, Cuff Size: Normal)   Pulse 82   Temp 97.5 F (36.4 C) (Oral)   Resp 16   Wt 141 lb (64 kg)   BMI 26.21 kg/m   Physical Exam  Constitutional: She is oriented to person, place, and time and well-developed, well-nourished, and in no distress.  HENT:  Head: Normocephalic and atraumatic.  Right Ear: External ear normal.  Left Ear: External ear normal.  Nose: Nose normal.    Eyes: Conjunctivae and EOM are normal. Pupils are equal, round, and reactive to light. No scleral icterus.  Neck: Normal range of motion. Neck supple. No thyromegaly present.  Cardiovascular: Normal rate, regular rhythm, normal heart sounds and intact distal pulses.   Pulmonary/Chest: Effort normal and breath sounds normal.  Abdominal: Soft.  Musculoskeletal: Normal range of motion.  Neurological: She is alert and oriented to person, place, and time. She has normal reflexes. Gait normal. GCS score is 15.  Skin: Skin is warm and dry.  Psychiatric: Mood, memory, affect and judgment normal.    Lab Results  Component Value Date   WBC 5.7 02/03/2016   HGB 13.5 02/03/2016   HCT 39.5 02/03/2016   PLT 198 02/03/2016   GLUCOSE 106 (H) 02/03/2016   CHOL 204 (A) 05/28/2013   TRIG 98 05/28/2013   HDL 51 05/28/2013   LDLCALC 133 05/28/2013   TSH 2.02 09/04/2014    CMP     Component Value Date/Time   NA 142 02/03/2016 0930   NA 142 09/04/2014   NA 142 04/07/2014 1317   K  3.8 02/03/2016 0930   K 4.0 04/07/2014 1317   CL 108 02/03/2016 0930   CL 108 (H) 04/07/2014 1317   CO2 25 02/03/2016 0930   CO2 26 04/07/2014 1317   GLUCOSE 106 (H) 02/03/2016 0930   GLUCOSE 93 04/07/2014 1317   BUN 20 02/03/2016 0930   BUN 39 (A) 09/04/2014   BUN 30 (H) 04/07/2014 1317   CREATININE 1.20 (H) 02/03/2016 0930   CREATININE 1.22 04/07/2014 1317   CALCIUM 9.5 02/03/2016 0930   CALCIUM 8.9 04/07/2014 1317   PROT 6.9 02/03/2016 0930   PROT 6.4 04/07/2014 1317   ALBUMIN 4.4 02/03/2016 0930   ALBUMIN 3.5 04/07/2014 1317   AST 17 02/03/2016 0930   AST 16 04/07/2014 1317   ALT 10 (L) 02/03/2016 0930   ALT 19 04/07/2014 1317   ALKPHOS 67 02/03/2016 0930   ALKPHOS 105 04/07/2014 1317   BILITOT 0.5 02/03/2016 0930   BILITOT 0.4 04/07/2014 1317   GFRNONAA 40 (L) 02/03/2016 0930   GFRNONAA 45 (L) 04/07/2014 1317   GFRNONAA 33 (L) 12/07/2013 0452   GFRAA 47 (L) 02/03/2016 0930   GFRAA 54 (L)  04/07/2014 1317   GFRAA 38 (L) 12/07/2013 0452    Assessment and Plan :  1. Essential (primary) hypertension Stable.   2. Chronic low back pain, unspecified back pain laterality, with sciatica presence unspecified Refills x 3 - HYDROcodone-acetaminophen (NORCO/VICODIN) 5-325 MG tablet; Take 1 tablet by mouth every 6 (six) hours as needed for moderate pain.  Dispense: 120 tablet; Refill: 0  3. Osteoarthritis of spine, unspecified spinal osteoarthritis complication status, unspecified spinal region   HPI, Exam, and A&P Transcribed under the direction and in the presence of Jenice Leiner L. Cranford Mon, MD  Electronically Signed: Katina Dung, CMA I have done the exam and reviewed the above chart and it is accurate to the best of my knowledge. Development worker, community has been used in this note in any air is in the dictation or transcription are unintentional.  [facility] 11/15/2016 1:58 PM

## 2016-12-09 ENCOUNTER — Other Ambulatory Visit: Payer: Self-pay | Admitting: Family Medicine

## 2017-01-09 ENCOUNTER — Other Ambulatory Visit: Payer: Self-pay | Admitting: Family Medicine

## 2017-05-17 ENCOUNTER — Ambulatory Visit: Payer: Self-pay | Admitting: Family Medicine

## 2017-05-18 ENCOUNTER — Ambulatory Visit (INDEPENDENT_AMBULATORY_CARE_PROVIDER_SITE_OTHER): Payer: Medicare Other | Admitting: Family Medicine

## 2017-05-18 VITALS — BP 132/64 | HR 72 | Temp 97.8°F | Resp 16 | Wt 136.4 lb

## 2017-05-18 DIAGNOSIS — K22710 Barrett's esophagus with low grade dysplasia: Secondary | ICD-10-CM

## 2017-05-18 DIAGNOSIS — I10 Essential (primary) hypertension: Secondary | ICD-10-CM

## 2017-05-18 DIAGNOSIS — R739 Hyperglycemia, unspecified: Secondary | ICD-10-CM | POA: Diagnosis not present

## 2017-05-18 DIAGNOSIS — M479 Spondylosis, unspecified: Secondary | ICD-10-CM

## 2017-05-18 DIAGNOSIS — M545 Low back pain: Secondary | ICD-10-CM | POA: Diagnosis not present

## 2017-05-18 DIAGNOSIS — Z23 Encounter for immunization: Secondary | ICD-10-CM

## 2017-05-18 DIAGNOSIS — G8929 Other chronic pain: Secondary | ICD-10-CM

## 2017-05-18 MED ORDER — HYDROCODONE-ACETAMINOPHEN 5-325 MG PO TABS: 1.0000 | ORAL_TABLET | Freq: Four times a day (QID) | ORAL | 0 refills | Status: DC | PRN

## 2017-05-18 NOTE — Progress Notes (Signed)
[name] [name]  MRN: [id number] DOB: April 03, 1930  Subjective:  HPI  Patient is here for follow up. Last office visit was on 11/15/16.  Patient is needing her Norco refill- she takes 1 tablet about 6 pm and then another tablet at midnight if she wakes up and has bad pain. Using a walker to ambulate.  Patient wants to discuss if she still needs to take pantoprazole. This medication was started after she was diagnosed with Barrett's esophagus. BP Readings from Last 3 Encounters:  05/18/17 132/64  11/15/16 140/62  05/18/16 128/66   Wt Readings from Last 3 Encounters:  05/18/17 136 lb 6.4 oz (61.9 kg)  11/15/16 141 lb (64 kg)  05/18/16 141 lb 12.8 oz (64.3 kg)    Patient Active Problem List   Diagnosis Date Noted  . Pulmonary emphysema ([place]) 11/16/2015  . Chronic obstructive pulmonary emphysema ([place]) 12/24/2014  . Allergic rhinitis 12/23/2014  . Back pain, chronic 12/23/2014  . Barrett's esophagus with low grade dysplasia 12/23/2014  . Aneurysm, cerebral 12/23/2014  . CAFL (chronic airflow limitation) ([place]) 12/23/2014  . Cognitive decline 12/23/2014  . Essential (primary) hypertension 12/23/2014  . Fatigue 12/23/2014  . Acid reflux 12/23/2014  . H/O peptic ulcer 12/23/2014  . History of biliary disease 12/23/2014  . HLD (hyperlipidemia) 12/23/2014  . Insomnia related to another mental disorder 12/23/2014  . Arthritis, degenerative 12/23/2014  . OP (osteoporosis) 12/23/2014  . Plantar fasciitis 12/23/2014  . Hypercholesterolemia without hypertriglyceridemia 12/23/2014  . Peptic ulcer 12/23/2014  . Kidney cysts 12/23/2014  . Fast heart beat 12/23/2014  . Tubular adenoma 12/23/2014  . Vertebral compression fracture ([place]) 12/23/2014  . Avitaminosis D 12/23/2014  . A-fib ([place]) 04/08/2014  . Hypertension 08/06/2007  . EMPHYSEMA 08/06/2007  . ASTHMA 08/06/2007  . COPD (chronic obstructive pulmonary disease) ([place]) 08/06/2007    Past Medical History:  Diagnosis Date  .  Asthma   . Cataract   . COPD (chronic obstructive pulmonary disease) ([facility])    -FeV1 74% DLCO 56%  . Emphysema   . HTN (hypertension)     Social History   Socioeconomic History  . Marital status: Widowed    Spouse name: Not on file  . Number of children: Not on file  . Years of education: Not on file  . Highest education level: Not on file  Social Needs  . Financial resource strain: Not on file  . Food insecurity - worry: Not on file  . Food insecurity - inability: Not on file  . Transportation needs - medical: Not on file  . Transportation needs - non-medical: Not on file  Occupational History  . Not on file  Tobacco Use  . Smoking status: Former Smoker    Last attempt to quit: 07/11/2002    Years since quitting: 14.8  . Smokeless tobacco: Never Used  Substance and Sexual Activity  . Alcohol use: Yes    Alcohol/week: 0.6 oz    Types: 1 Glasses of wine per week    Comment: occasionally  . Drug use: No  . Sexual activity: No  Other Topics Concern  . Not on file  Social History Narrative  . Not on file    Outpatient Encounter Medications as of 05/18/2017  Medication Sig Note  . ADVAIR DISKUS 250-50 MCG/DOSE AEPB INHALE 1 PUFF EVERY 12 HOURS. BREATHE INSTEADILY AND DEEPLY. RINSE MOUTH AFTER USE.   . calcium carbonate (OS-CAL) 600 MG TABS tablet Take by mouth. Reported on 11/16/2015 12/23/2014: Received from: [facility]  . Cholecalciferol 1000 UNITS capsule Take by mouth. 12/23/2014: Received from: [facility]  . HYDROcodone-acetaminophen (NORCO/VICODIN) 5-325 MG tablet Take 1 tablet by mouth every 6 (six) hours as needed for moderate pain.   Marland Kitchen losartan-hydrochlorothiazide (HYZAAR) 100-25 MG tablet TAKE 1 TABLET BY MOUTH DAILY USUALLY IN THE MORINING   . Multiple Vitamins-Calcium (VIACTIV MULTI-VITAMIN) CHEW Chew 1 tablet by mouth 2 (two) times daily.   . pantoprazole (PROTONIX) 40 MG tablet TAKE 1 TABLET BY MOUTH DAILY   . Probiotic Product  (PROBIOTIC DAILY) CAPS Take by mouth. 12/23/2014: Received from: [facility]  . [DISCONTINUED] ondansetron (ZOFRAN) 4 MG tablet Take 1 tablet (4 mg total) by mouth daily as needed for nausea or vomiting. (Patient not taking: Reported on 11/15/2016)    No facility-administered encounter medications on file as of 05/18/2017.     Allergies  Allergen Reactions  . Ciprofloxacin     Other reaction(s): Unknown  . Macrobid  [Nitrofurantoin]   . Metoprolol     feet swelling/pain  . Sulfa Antibiotics   . Sulfonamide Derivatives     Review of Systems  Constitutional: Negative.   Eyes: Negative.   Respiratory: Positive for shortness of breath (at times on exertion).   Cardiovascular: Negative.   Gastrointestinal: Negative.   Musculoskeletal: Positive for back pain and joint pain.  Skin: Negative.   Neurological: Negative.   Endo/Heme/Allergies: Negative.   Psychiatric/Behavioral: Negative.     Objective:  BP 132/64   Pulse 72   Temp 97.8 F (36.6 C)   Resp 16   Wt 136 lb 6.4 oz (61.9 kg)   BMI 25.36 kg/m   Physical Exam  Constitutional: She is well-developed, well-nourished, and in no distress.  HENT:  Head: Normocephalic and atraumatic.  Eyes: Conjunctivae are normal. No scleral icterus.    Assessment and Plan :  1. Chronic low back pain, unspecified back pain laterality, with sciatica presence unspecified Stable. RF x 3 today. - HYDROcodone-acetaminophen (NORCO/VICODIN) 5-325 MG tablet; Take 1 tablet every 6 (six) hours as needed by mouth for moderate pain.  Dispense: 120 tablet; Refill: 0  2. Osteoarthritis of spine, unspecified spinal osteoarthritis complication status, unspecified spinal region 3. Barrett's esophagus with low grade dysplasia Lifelong PPI. Pt might wish to see Dr Hilarie Fredrickson in future. - TSH  4. Essential hypertension Stable. Continue current regimen. - CBC with Differential/Platelet - Comprehensive metabolic panel - TSH Since patient is  not fasting today and it is hard for her to come back for fasting will not order Lipid level today. 5. Need for immunization against influenza - Flu vaccine HIGH DOSE PF (Fluzone High dose)  6. Hyperglycemia Last glucose level was slightly elevated. - HgB A1c  HPI, Exam and A&P transcribed by Tiffany Kocher, RMA under direction and in the presence of Miguel Aschoff, MD. I have done the exam and reviewed the chart and it is accurate to the best of my knowledge. Development worker, community has been used and  any errors in dictation or transcription are unintentional. Miguel Aschoff M.D. [facility]

## 2017-05-19 LAB — CBC WITH DIFFERENTIAL/PLATELET
Basophils Absolute: 29 cells/uL (ref 0–200)
Basophils Relative: 0.5 %
EOS PCT: 2.1 %
Eosinophils Absolute: 120 cells/uL (ref 15–500)
HCT: 36.3 % (ref 35.0–45.0)
Hemoglobin: 12.4 g/dL (ref 11.7–15.5)
Lymphs Abs: 1385 cells/uL (ref 850–3900)
MCH: 31.3 pg (ref 27.0–33.0)
MCHC: 34.2 g/dL (ref 32.0–36.0)
MCV: 91.7 fL (ref 80.0–100.0)
MPV: 10 fL (ref 7.5–12.5)
Monocytes Relative: 7.5 %
NEUTROS PCT: 65.6 %
Neutro Abs: 3739 cells/uL (ref 1500–7800)
PLATELETS: 206 10*3/uL (ref 140–400)
RBC: 3.96 10*6/uL (ref 3.80–5.10)
RDW: 12.8 % (ref 11.0–15.0)
Total Lymphocyte: 24.3 %
WBC: 5.7 10*3/uL (ref 3.8–10.8)
WBCMIX: 428 {cells}/uL (ref 200–950)

## 2017-05-19 LAB — COMPREHENSIVE METABOLIC PANEL
AG Ratio: 2.1 (calc) (ref 1.0–2.5)
ALBUMIN MSPROF: 4.1 g/dL (ref 3.6–5.1)
ALKALINE PHOSPHATASE (APISO): 89 U/L (ref 33–130)
ALT: 6 U/L (ref 6–29)
AST: 10 U/L (ref 10–35)
BUN/Creatinine Ratio: 16 (calc) (ref 6–22)
BUN: 24 mg/dL (ref 7–25)
CO2: 26 mmol/L (ref 20–32)
CREATININE: 1.48 mg/dL — AB (ref 0.60–0.88)
Calcium: 9 mg/dL (ref 8.6–10.4)
Chloride: 107 mmol/L (ref 98–110)
Globulin: 2 g/dL (calc) (ref 1.9–3.7)
Glucose, Bld: 83 mg/dL (ref 65–99)
POTASSIUM: 3.8 mmol/L (ref 3.5–5.3)
Sodium: 143 mmol/L (ref 135–146)
Total Bilirubin: 0.4 mg/dL (ref 0.2–1.2)
Total Protein: 6.1 g/dL (ref 6.1–8.1)

## 2017-05-19 LAB — HEMOGLOBIN A1C
Hgb A1c MFr Bld: 5.5 % of total Hgb (ref <5.7)
MEAN PLASMA GLUCOSE: 111 (calc)
eAG (mmol/L): 6.2 (calc)

## 2017-05-19 LAB — TSH: TSH: 2.23 m[IU]/L (ref 0.40–4.50)

## 2017-05-25 ENCOUNTER — Telehealth: Payer: Self-pay

## 2017-05-25 NOTE — Telephone Encounter (Signed)
Called pt to set up AWV. Pt declined apt and states she is going to skip it this year.  -MM

## 2017-08-08 ENCOUNTER — Other Ambulatory Visit: Payer: Self-pay

## 2017-08-08 ENCOUNTER — Telehealth: Payer: Self-pay | Admitting: Family Medicine

## 2017-08-08 MED ORDER — PANTOPRAZOLE SODIUM 40 MG PO TBEC: 40.0000 mg | DELAYED_RELEASE_TABLET | Freq: Every day | ORAL | 12 refills | Status: DC

## 2017-08-08 MED ORDER — FLUTICASONE-SALMETEROL 250-50 MCG/DOSE IN AEPB: INHALATION_SPRAY | RESPIRATORY_TRACT | 12 refills | Status: DC

## 2017-08-08 MED ORDER — LOSARTAN POTASSIUM-HCTZ 100-25 MG PO TABS: ORAL_TABLET | ORAL | 11 refills | Status: DC

## 2017-08-08 NOTE — Telephone Encounter (Signed)
Patient is calling requesting new prescriptions for the following medications due to her pharmacy closing  pantoprazole (PROTONIX) 40 MG tablet   losartan-hydrochlorothiazide (HYZAAR) 100-25 MG tablet  ADVAIR DISKUS 250-50 MCG/DOSE AEPB  She would like these sent [facility] in [place].

## 2017-09-18 ENCOUNTER — Encounter: Payer: Self-pay | Admitting: Family Medicine

## 2017-09-18 ENCOUNTER — Ambulatory Visit (INDEPENDENT_AMBULATORY_CARE_PROVIDER_SITE_OTHER): Payer: Medicare Other | Admitting: Family Medicine

## 2017-09-18 VITALS — BP 150/72 | HR 100 | Temp 97.3°F | Resp 16

## 2017-09-18 DIAGNOSIS — G8929 Other chronic pain: Secondary | ICD-10-CM | POA: Diagnosis not present

## 2017-09-18 DIAGNOSIS — I10 Essential (primary) hypertension: Secondary | ICD-10-CM | POA: Diagnosis not present

## 2017-09-18 DIAGNOSIS — R110 Nausea: Secondary | ICD-10-CM

## 2017-09-18 DIAGNOSIS — M545 Low back pain: Secondary | ICD-10-CM | POA: Diagnosis not present

## 2017-09-18 MED ORDER — ONDANSETRON HCL 4 MG PO TABS: 4.0000 mg | ORAL_TABLET | Freq: Three times a day (TID) | ORAL | 1 refills | Status: DC | PRN

## 2017-09-18 MED ORDER — HYDROCODONE-ACETAMINOPHEN 5-325 MG PO TABS: 1.0000 | ORAL_TABLET | Freq: Four times a day (QID) | ORAL | 0 refills | Status: DC | PRN

## 2017-09-18 NOTE — Progress Notes (Signed)
Patient: [name] [name] Female    DOB: 1930-01-13   84 y.o.   MRN: [id number] Visit Date: 09/18/2017  Today's Provider: Wilhemena Durie, MD   Chief Complaint  Patient presents with  . Follow-up   Subjective:    HPI  Pt is here today for a 4 month follow up of her chronic problems. She reports that she has been feeling fairly well. She has been having vertigo in the morning but only if she sleeps on her back. If she sleeps on her sides she gets up and is fine. Sometimes when she tilts her head a certain way she can feel it as well. She reports that she has also had some nausea in the mornings as well. She is taking hydrocodone, she takes 2 a day. Pt would like a refill on Zofran today. She is also needing a refill on her pain medication. Pt feels well overall.    Allergies  Allergen Reactions  . Ciprofloxacin     Other reaction(s): Unknown  . Macrobid  [Nitrofurantoin]   . Metoprolol     feet swelling/pain  . Sulfa Antibiotics   . Sulfonamide Derivatives      Current Outpatient Medications:  .  calcium carbonate (OS-CAL) 600 MG TABS tablet, Take by mouth. Reported on 11/16/2015, Disp: , Rfl:  .  Cholecalciferol 1000 UNITS capsule, Take by mouth., Disp: , Rfl:  .  Fluticasone-Salmeterol (ADVAIR DISKUS) 250-50 MCG/DOSE AEPB, INHALE 1 PUFF EVERY 12 HOURS. BREATHE INSTEADILY AND DEEPLY. RINSE MOUTH AFTER USE., Disp: 60 each, Rfl: 12 .  HYDROcodone-acetaminophen (NORCO/VICODIN) 5-325 MG tablet, Take 1 tablet every 6 (six) hours as needed by mouth for moderate pain., Disp: 120 tablet, Rfl: 0 .  losartan-hydrochlorothiazide (HYZAAR) 100-25 MG tablet, TAKE 1 TABLET BY MOUTH DAILY USUALLY IN THE MORINING, Disp: 30 tablet, Rfl: 11 .  pantoprazole (PROTONIX) 40 MG tablet, Take 1 tablet (40 mg total) by mouth daily., Disp: 30 tablet, Rfl: 12 .  Probiotic Product (PROBIOTIC DAILY) CAPS, Take by mouth., Disp: , Rfl:  .  Multiple Vitamins-Calcium (VIACTIV MULTI-VITAMIN) CHEW, Chew 1  tablet by mouth 2 (two) times daily., Disp: , Rfl:   Review of Systems  Constitutional: Positive for fatigue.  HENT: Negative.   Eyes: Negative.   Respiratory: Negative.   Cardiovascular: Negative.   Gastrointestinal: Positive for nausea.  Endocrine: Negative.   Genitourinary: Negative.   Musculoskeletal: Positive for back pain.  Skin: Negative.   Allergic/Immunologic: Negative.   Neurological: Positive for dizziness and headaches.  Hematological: Negative.   Psychiatric/Behavioral: Negative.     Social History   Tobacco Use  . Smoking status: Former Smoker    Last attempt to quit: 07/11/2002    Years since quitting: 15.2  . Smokeless tobacco: Never Used  Substance Use Topics  . Alcohol use: Yes    Alcohol/week: 0.6 oz    Types: 1 Glasses of wine per week    Comment: occasionally   Objective:   BP (!) 150/72 (BP Location: Left Arm, Patient Position: Sitting, Cuff Size: Normal)   Pulse 100   Temp (!) 97.3 F (36.3 C) (Oral)   Resp 16  Vitals:   09/18/17 1434  BP: (!) 150/72  Pulse: 100  Resp: 16  Temp: (!) 97.3 F (36.3 C)  TempSrc: Oral     Physical Exam  Constitutional: She is oriented to person, place, and time. She appears well-developed and well-nourished.  HENT:  Head: Normocephalic and atraumatic.  Eyes:  Conjunctivae and EOM are normal. Pupils are equal, round, and reactive to light.  Nystagmus   Neck: Normal range of motion. Neck supple.  Cardiovascular: Normal rate, regular rhythm, normal heart sounds and intact distal pulses.  Pulmonary/Chest: Effort normal and breath sounds normal.  Abdominal: Soft.  Musculoskeletal: Normal range of motion.  Neurological: She is alert and oriented to person, place, and time. She has normal reflexes.  Mild nystagmus--bilateral nystagmus--otherwise nonfocal.  Skin: Skin is warm and dry.  Psychiatric: She has a normal mood and affect. Her behavior is normal. Judgment and thought content normal.          Assessment & Plan:     1. Chronic low back pain, unspecified back pain laterality, with sciatica presence unspecified Encouraged to cut pills in half to lower fall risk as she ages. She says she hurts so much she needs meds to sleep. - HYDROcodone-acetaminophen (NORCO/VICODIN) 5-325 MG tablet; Take 1 tablet by mouth every 6 (six) hours as needed for moderate pain. 11/18/17  Dispense: 120 tablet; Refill: 0  2. Nausea Due to meds. - ondansetron (ZOFRAN) 4 MG tablet; Take 1 tablet (4 mg total) by mouth every 8 (eight) hours as needed for nausea or vomiting.  Dispense: 60 tablet; Refill: 1  3. Essential hypertension Elevated today. Follow.  MMSE next ov  4.Mild Vertigo Supportive care for now.    HPI, Exam, and A&P Transcribed under the direction and in the presence of Richard L. Cranford Mon, MD  Electronically Signed: Katina Dung, CMA  I have done the exam and reviewed the above chart and it is accurate to the best of my knowledge. Development worker, community has been used in this note in any air is in the dictation or transcription are unintentional.  Wilhemena Durie, MD  [place]

## 2018-02-19 ENCOUNTER — Encounter: Payer: Self-pay | Admitting: Family Medicine

## 2018-02-19 ENCOUNTER — Ambulatory Visit (INDEPENDENT_AMBULATORY_CARE_PROVIDER_SITE_OTHER): Payer: Medicare Other | Admitting: Family Medicine

## 2018-02-19 VITALS — BP 128/84 | HR 84 | Temp 98.0°F | Resp 16 | Wt 119.0 lb

## 2018-02-19 DIAGNOSIS — R634 Abnormal weight loss: Secondary | ICD-10-CM | POA: Diagnosis not present

## 2018-02-19 DIAGNOSIS — M545 Low back pain: Secondary | ICD-10-CM

## 2018-02-19 DIAGNOSIS — G8929 Other chronic pain: Secondary | ICD-10-CM | POA: Diagnosis not present

## 2018-02-19 DIAGNOSIS — M479 Spondylosis, unspecified: Secondary | ICD-10-CM

## 2018-02-19 DIAGNOSIS — J439 Emphysema, unspecified: Secondary | ICD-10-CM | POA: Diagnosis not present

## 2018-02-19 DIAGNOSIS — R630 Anorexia: Secondary | ICD-10-CM

## 2018-02-19 DIAGNOSIS — I10 Essential (primary) hypertension: Secondary | ICD-10-CM | POA: Diagnosis not present

## 2018-02-19 MED ORDER — HYDROCODONE-ACETAMINOPHEN 5-325 MG PO TABS
1.0000 | ORAL_TABLET | Freq: Four times a day (QID) | ORAL | 0 refills | Status: DC | PRN
Start: 2018-02-19 — End: 2018-04-24

## 2018-02-19 MED ORDER — MIRTAZAPINE 30 MG PO TBDP: 30.0000 mg | ORAL_TABLET | Freq: Every day | ORAL | 5 refills | Status: DC

## 2018-02-19 NOTE — Progress Notes (Signed)
Patient: [name] [name] Female    DOB: [birth year]   84 y.o.   MRN: [id number] Visit Date: 02/19/2018  Today's Provider: Wilhemena Durie, MD   Chief Complaint  Patient presents with  . Pain  . Hypertension  . Gastroesophageal Reflux   Subjective:    HPI  Patient comes in today for a follow up. She was last seen in the office about 4 months ago. No changes were made in her medications. She feels well today with no complaints. On last visit, she c/o dizziness and nausea. She reports that this has resolved. She no longer has to take Zofran.   She checks her BP occasionally. Denies headaches, chest pain, or swelling in her extremities. She does mention that she has lost more weight due to not having an appetite. She has lost 17lbs since her last visit.   MMSE was 30/30 today.     Allergies  Allergen Reactions  . Ciprofloxacin     Other reaction(s): Unknown  . Macrobid  [Nitrofurantoin]   . Metoprolol     feet swelling/pain  . Sulfa Antibiotics   . Sulfonamide Derivatives      Current Outpatient Medications:  .  calcium carbonate (OS-CAL) 600 MG TABS tablet, Take by mouth. Reported on 11/16/2015, Disp: , Rfl:  .  Cholecalciferol 1000 UNITS capsule, Take by mouth., Disp: , Rfl:  .  Fluticasone-Salmeterol (ADVAIR DISKUS) 250-50 MCG/DOSE AEPB, INHALE 1 PUFF EVERY 12 HOURS. BREATHE INSTEADILY AND DEEPLY. RINSE MOUTH AFTER USE., Disp: 60 each, Rfl: 12 .  HYDROcodone-acetaminophen (NORCO/VICODIN) 5-325 MG tablet, Take 1 tablet by mouth every 6 (six) hours as needed for moderate pain. 11/18/17, Disp: 120 tablet, Rfl: 0 .  losartan-hydrochlorothiazide (HYZAAR) 100-25 MG tablet, TAKE 1 TABLET BY MOUTH DAILY USUALLY IN THE MORINING, Disp: 30 tablet, Rfl: 11 .  Multiple Vitamins-Calcium (VIACTIV MULTI-VITAMIN) CHEW, Chew 1 tablet by mouth 2 (two) times daily., Disp: , Rfl:  .  pantoprazole (PROTONIX) 40 MG tablet, Take 1 tablet (40 mg total) by mouth daily., Disp: 30 tablet, Rfl:  12 .  Probiotic Product (PROBIOTIC DAILY) CAPS, Take by mouth., Disp: , Rfl:  .  ondansetron (ZOFRAN) 4 MG tablet, Take 1 tablet (4 mg total) by mouth every 8 (eight) hours as needed for nausea or vomiting. (Patient not taking: Reported on 02/19/2018), Disp: 60 tablet, Rfl: 1  Review of Systems  Constitutional: Positive for appetite change and unexpected weight change. Negative for activity change, chills, diaphoresis, fatigue and fever.  Eyes: Negative.   Respiratory: Negative for cough and shortness of breath.   Cardiovascular: Negative for chest pain, palpitations and leg swelling.  Endocrine: Negative for cold intolerance, heat intolerance, polydipsia, polyphagia and polyuria.  Musculoskeletal: Positive for arthralgias and back pain. Negative for gait problem.  Allergic/Immunologic: Negative for environmental allergies, food allergies and immunocompromised state.  Psychiatric/Behavioral: Negative for agitation, behavioral problems, confusion, decreased concentration, self-injury, sleep disturbance and suicidal ideas. The patient is not nervous/anxious and is not hyperactive.     Social History   Tobacco Use  . Smoking status: Former Smoker    Last attempt to quit: 07/11/2002    Years since quitting: 15.6  . Smokeless tobacco: Never Used  Substance Use Topics  . Alcohol use: Yes    Alcohol/week: 1.0 standard drinks    Types: 1 Glasses of wine per week    Comment: occasionally   Objective:   BP 128/84 (BP Location: Right Arm, Patient Position: Sitting, Cuff Size:  Normal) Comment: per patient she wanted to wait for 46mins before taking BP  Pulse 84   Temp 98 F (36.7 C)   Resp 16   Wt 119 lb (54 kg)   BMI 22.12 kg/m  Vitals:   02/19/18 1438  BP: 128/84  Pulse: 84  Resp: 16  Temp: 98 F (36.7 C)  Weight: 119 lb (54 kg)     Physical Exam  Constitutional: She is oriented to person, place, and time. She appears well-developed and well-nourished.  HENT:  Head:  Normocephalic and atraumatic.  Right Ear: External ear normal.  Left Ear: External ear normal.  Nose: Nose normal.  Eyes: Conjunctivae are normal. No scleral icterus.  Neck: No thyromegaly present.  Cardiovascular: Normal rate, regular rhythm and normal heart sounds.  Pulmonary/Chest: Effort normal and breath sounds normal.  Abdominal: Soft.  Musculoskeletal: She exhibits no edema.  Neurological: She is alert and oriented to person, place, and time.  Skin: Skin is warm and dry.  Psychiatric: She has a normal mood and affect. Her behavior is normal. Judgment and thought content normal.        Assessment & Plan:     1. Weight loss Before undergoing extensive w/u pt wishes to try Mirtazapine . RTC 1 month. - TSH - CBC with Differential/Platelet - Comprehensive metabolic panel - mirtazapine (REMERON SOL-TAB) 30 MG disintegrating tablet; Take 1 tablet (30 mg total) by mouth at bedtime.  Dispense: 30 tablet; Refill: 5  2. Loss of appetite  - mirtazapine (REMERON SOL-TAB) 30 MG disintegrating tablet; Take 1 tablet (30 mg total) by mouth at bedtime.  Dispense: 30 tablet; Refill: 5  3. Chronic low back pain, unspecified back pain laterality, with sciatica presence unspecified  - HYDROcodone-acetaminophen (NORCO/VICODIN) 5-325 MG tablet; Take 1 tablet by mouth every 6 (six) hours as needed for moderate pain. 11/18/17  Dispense: 120 tablet; Refill: 0 4.COPD Stable 5.OA      I have done the exam and reviewed the above chart and it is accurate to the best of my knowledge. Development worker, community has been used in this note in any air is in the dictation or transcription are unintentional.  Wilhemena Durie, MD  [place]

## 2018-02-20 LAB — CBC WITH DIFFERENTIAL/PLATELET
BASOS ABS: 0 10*3/uL (ref 0.0–0.2)
Basos: 0 %
EOS (ABSOLUTE): 0.1 10*3/uL (ref 0.0–0.4)
Eos: 1 %
Hematocrit: 36.7 % (ref 34.0–46.6)
Hemoglobin: 12 g/dL (ref 11.1–15.9)
IMMATURE GRANULOCYTES: 0 %
Immature Grans (Abs): 0 10*3/uL (ref 0.0–0.1)
Lymphocytes Absolute: 1.8 10*3/uL (ref 0.7–3.1)
Lymphs: 31 %
MCH: 31.3 pg (ref 26.6–33.0)
MCHC: 32.7 g/dL (ref 31.5–35.7)
MCV: 96 fL (ref 79–97)
MONOS ABS: 0.3 10*3/uL (ref 0.1–0.9)
Monocytes: 6 %
NEUTROS PCT: 62 %
Neutrophils Absolute: 3.6 10*3/uL (ref 1.4–7.0)
PLATELETS: 199 10*3/uL (ref 150–450)
RBC: 3.83 x10E6/uL (ref 3.77–5.28)
RDW: 14.6 % (ref 12.3–15.4)
WBC: 5.8 10*3/uL (ref 3.4–10.8)

## 2018-02-20 LAB — COMPREHENSIVE METABOLIC PANEL
A/G RATIO: 2.4 — AB (ref 1.2–2.2)
ALT: 8 IU/L (ref 0–32)
AST: 16 IU/L (ref 0–40)
Albumin: 4.4 g/dL (ref 3.5–4.7)
Alkaline Phosphatase: 87 IU/L (ref 39–117)
BILIRUBIN TOTAL: 0.3 mg/dL (ref 0.0–1.2)
BUN/Creatinine Ratio: 18 (ref 12–28)
BUN: 24 mg/dL (ref 8–27)
CO2: 23 mmol/L (ref 20–29)
Calcium: 9.9 mg/dL (ref 8.7–10.3)
Chloride: 107 mmol/L — ABNORMAL HIGH (ref 96–106)
Creatinine, Ser: 1.34 mg/dL — ABNORMAL HIGH (ref 0.57–1.00)
GFR calc non Af Amer: 36 mL/min/{1.73_m2} — ABNORMAL LOW (ref >59)
GFR, EST AFRICAN AMERICAN: 41 mL/min/{1.73_m2} — AB (ref >59)
GLUCOSE: 76 mg/dL (ref 65–99)
Globulin, Total: 1.8 g/dL (ref 1.5–4.5)
POTASSIUM: 4.4 mmol/L (ref 3.5–5.2)
Sodium: 147 mmol/L — ABNORMAL HIGH (ref 134–144)
TOTAL PROTEIN: 6.2 g/dL (ref 6.0–8.5)

## 2018-02-20 LAB — TSH: TSH: 2.62 u[IU]/mL (ref 0.450–4.500)

## 2018-04-24 ENCOUNTER — Ambulatory Visit (INDEPENDENT_AMBULATORY_CARE_PROVIDER_SITE_OTHER): Payer: Medicare Other | Admitting: Family Medicine

## 2018-04-24 VITALS — BP 145/83 | HR 96 | Temp 98.6°F | Resp 16 | Wt 123.0 lb

## 2018-04-24 DIAGNOSIS — G8929 Other chronic pain: Secondary | ICD-10-CM | POA: Diagnosis not present

## 2018-04-24 DIAGNOSIS — K22710 Barrett's esophagus with low grade dysplasia: Secondary | ICD-10-CM

## 2018-04-24 DIAGNOSIS — Z23 Encounter for immunization: Secondary | ICD-10-CM | POA: Diagnosis not present

## 2018-04-24 DIAGNOSIS — M545 Low back pain, unspecified: Secondary | ICD-10-CM

## 2018-04-24 DIAGNOSIS — J439 Emphysema, unspecified: Secondary | ICD-10-CM | POA: Diagnosis not present

## 2018-04-24 DIAGNOSIS — E785 Hyperlipidemia, unspecified: Secondary | ICD-10-CM | POA: Diagnosis not present

## 2018-04-24 DIAGNOSIS — I10 Essential (primary) hypertension: Secondary | ICD-10-CM | POA: Diagnosis not present

## 2018-04-24 MED ORDER — HYDROCODONE-ACETAMINOPHEN 5-325 MG PO TABS: 1.0000 | ORAL_TABLET | Freq: Four times a day (QID) | ORAL | 0 refills | Status: DC | PRN

## 2018-04-24 NOTE — Progress Notes (Signed)
[name] [name]  MRN: [id number] DOB: 1930-02-23  Subjective:  HPI   The patient is an 84 year old female who presents for follow up of weight loss.  She was last seen on 02/19/18 and at that time she was given Mirtazapine to help with her appetite.   Wt Readings from Last 3 Encounters:  04/24/18 123 lb (55.8 kg)  02/19/18 119 lb (54 kg)  05/18/17 136 lb 6.4 oz (61.9 kg)   The patient was unable to take the Mirtazapine.  She states that when she took it she would wake the next day dizzy and nauseated.  Her weight however is up 4 pounds since her last visit.Overall she is feeling fairly well.  Patient Active Problem List   Diagnosis Date Noted  . Pulmonary emphysema ([place]) 11/16/2015  . Chronic obstructive pulmonary emphysema ([place]) 12/24/2014  . Allergic rhinitis 12/23/2014  . Back pain, chronic 12/23/2014  . Barrett's esophagus with low grade dysplasia 12/23/2014  . Aneurysm, cerebral 12/23/2014  . CAFL (chronic airflow limitation) ([place]) 12/23/2014  . Cognitive decline 12/23/2014  . Essential (primary) hypertension 12/23/2014  . Fatigue 12/23/2014  . Acid reflux 12/23/2014  . H/O peptic ulcer 12/23/2014  . History of biliary disease 12/23/2014  . HLD (hyperlipidemia) 12/23/2014  . Insomnia related to another mental disorder 12/23/2014  . Arthritis, degenerative 12/23/2014  . OP (osteoporosis) 12/23/2014  . Plantar fasciitis 12/23/2014  . Hypercholesterolemia without hypertriglyceridemia 12/23/2014  . Peptic ulcer 12/23/2014  . Kidney cysts 12/23/2014  . Fast heart beat 12/23/2014  . Tubular adenoma 12/23/2014  . Vertebral compression fracture ([place]) 12/23/2014  . Avitaminosis D 12/23/2014  . A-fib ([place]) 04/08/2014  . Hypertension 08/06/2007  . EMPHYSEMA 08/06/2007  . ASTHMA 08/06/2007  . COPD (chronic obstructive pulmonary disease) ([place]) 08/06/2007    Past Medical History:  Diagnosis Date  . Asthma   . Cataract   . COPD (chronic obstructive pulmonary disease)  (HCC)    -FeV1 74% DLCO 56%  . Emphysema   . HTN (hypertension)     Social History   Socioeconomic History  . Marital status: Widowed    Spouse name: Not on file  . Number of children: Not on file  . Years of education: Not on file  . Highest education level: Not on file  Occupational History  . Not on file  Social Needs  . Financial resource strain: Not on file  . Food insecurity:    Worry: Not on file    Inability: Not on file  . Transportation needs:    Medical: Not on file    Non-medical: Not on file  Tobacco Use  . Smoking status: Former Smoker    Last attempt to quit: 07/11/2002    Years since quitting: 15.7  . Smokeless tobacco: Never Used  Substance and Sexual Activity  . Alcohol use: Yes    Alcohol/week: 1.0 standard drinks    Types: 1 Glasses of wine per week    Comment: occasionally  . Drug use: No  . Sexual activity: Never  Lifestyle  . Physical activity:    Days per week: Not on file    Minutes per session: Not on file  . Stress: Not on file  Relationships  . Social connections:    Talks on phone: Not on file    Gets together: Not on file    Attends religious service: Not on file    Active member of club or organization: Not on file    Attends  meetings of clubs or organizations: Not on file    Relationship status: Not on file  . Intimate partner violence:    Fear of current or ex partner: Not on file    Emotionally abused: Not on file    Physically abused: Not on file    Forced sexual activity: Not on file  Other Topics Concern  . Not on file  Social History Narrative  . Not on file    Outpatient Encounter Medications as of 04/24/2018  Medication Sig Note  . Fluticasone-Salmeterol (ADVAIR DISKUS) 250-50 MCG/DOSE AEPB INHALE 1 PUFF EVERY 12 HOURS. BREATHE INSTEADILY AND DEEPLY. RINSE MOUTH AFTER USE.   Marland Kitchen HYDROcodone-acetaminophen (NORCO/VICODIN) 5-325 MG tablet Take 1 tablet by mouth every 6 (six) hours as needed for moderate pain. 11/18/17   .  losartan-hydrochlorothiazide (HYZAAR) 100-25 MG tablet TAKE 1 TABLET BY MOUTH DAILY USUALLY IN THE MORINING   . Multiple Vitamins-Calcium (VIACTIV MULTI-VITAMIN) CHEW Chew 1 tablet by mouth 2 (two) times daily.   . ondansetron (ZOFRAN) 4 MG tablet Take 1 tablet (4 mg total) by mouth every 8 (eight) hours as needed for nausea or vomiting.   . pantoprazole (PROTONIX) 40 MG tablet Take 1 tablet (40 mg total) by mouth daily.   . Probiotic Product (PROBIOTIC DAILY) CAPS Take by mouth. 12/23/2014: Received from: [facility]  . [DISCONTINUED] calcium carbonate (OS-CAL) 600 MG TABS tablet Take by mouth. Reported on 11/16/2015 12/23/2014: Received from: [facility]  . [DISCONTINUED] Cholecalciferol 1000 UNITS capsule Take by mouth. 12/23/2014: Received from: [facility]  . [DISCONTINUED] mirtazapine (REMERON SOL-TAB) 30 MG disintegrating tablet Take 1 tablet (30 mg total) by mouth at bedtime.    No facility-administered encounter medications on file as of 04/24/2018.     Allergies  Allergen Reactions  . Ciprofloxacin     Other reaction(s): Unknown  . Macrobid  [Nitrofurantoin]   . Metoprolol     feet swelling/pain  . Sulfa Antibiotics   . Sulfonamide Derivatives     Review of Systems  Constitutional: Negative for fever, malaise/fatigue and weight loss (improving).  Eyes: Negative.   Respiratory: Positive for cough, shortness of breath and wheezing.        Chronic but unchanged and not bad currently   Cardiovascular: Negative for chest pain, palpitations and leg swelling.  Gastrointestinal: Negative.   Skin: Negative.   Neurological: Negative.   Endo/Heme/Allergies: Negative.   Psychiatric/Behavioral: Negative.     Objective:  BP (!) 145/83 (BP Location: Right Arm, Patient Position: Sitting, Cuff Size: Normal)   Pulse 96   Temp 98.6 F (37 C) (Oral)   Resp 16   Wt 123 lb (55.8 kg)   BMI 22.86 kg/m   Physical Exam  Constitutional:  She is oriented to person, place, and time and well-developed, well-nourished, and in no distress.  HENT:  Head: Normocephalic and atraumatic.  Right Ear: External ear normal.  Left Ear: External ear normal.  Nose: Nose normal.  Eyes: Conjunctivae are normal.  Neck: No thyromegaly present.  Cardiovascular: Normal rate, regular rhythm and normal heart sounds.  Pulmonary/Chest: Effort normal and breath sounds normal.  Abdominal: Soft.  Musculoskeletal: She exhibits no edema.  Neurological: She is alert and oriented to person, place, and time. Gait normal. GCS score is 15.  Skin: Skin is warm and dry.  Psychiatric: Mood, memory, affect and judgment normal.    Assessment and Plan :   1. Need for influenza vaccination  - Flu vaccine HIGH DOSE PF (  Fluzone High dose)  2. Chronic low back pain  - HYDROcodone-acetaminophen (NORCO/VICODIN) 5-325 MG tablet; Take 1 tablet by mouth every 6 (six) hours as needed for moderate pain. 11/18/17  Dispense: 120 tablet; Refill: 0  3. Essential hypertension   4. Pulmonary emphysema, unspecified emphysema type ([place])   5. Barrett's esophagus with low grade dysplasia   6. Hyperlipidemia, unspecified hyperlipidemia type   7. Chronic low back pain, unspecified back pain laterality, unspecified whether sciatica present RTC spring 2020.  I have done the exam and reviewed the chart and it is accurate to the best of my knowledge. Development worker, community has been used and  any errors in dictation or transcription are unintentional. Miguel Aschoff M.D. [facility]

## 2018-05-29 ENCOUNTER — Other Ambulatory Visit: Payer: Self-pay

## 2018-08-07 ENCOUNTER — Other Ambulatory Visit: Payer: Self-pay | Admitting: Family Medicine

## 2018-08-07 DIAGNOSIS — M545 Low back pain: Principal | ICD-10-CM

## 2018-08-07 DIAGNOSIS — G8929 Other chronic pain: Secondary | ICD-10-CM

## 2018-08-07 NOTE — Telephone Encounter (Signed)
Pt needing refills on:  losartan-hydrochlorothiazide (HYZAAR) 100-25 MG  HYDROcodone-acetaminophen (NORCO/VICODIN) 5-325 MG tablet  Please fill at:  [place], [place] [phone] (Phone) [phone] (Fax)   Thanks, [place]

## 2018-08-08 MED ORDER — LOSARTAN POTASSIUM-HCTZ 100-25 MG PO TABS: ORAL_TABLET | ORAL | 11 refills | Status: DC

## 2018-08-08 MED ORDER — HYDROCODONE-ACETAMINOPHEN 5-325 MG PO TABS: 1.0000 | ORAL_TABLET | Freq: Four times a day (QID) | ORAL | 0 refills | Status: DC | PRN

## 2018-08-18 ENCOUNTER — Other Ambulatory Visit: Payer: Self-pay | Admitting: Family Medicine

## 2018-10-01 ENCOUNTER — Other Ambulatory Visit: Payer: Self-pay | Admitting: Family Medicine

## 2018-10-01 DIAGNOSIS — G8929 Other chronic pain: Secondary | ICD-10-CM

## 2018-10-01 DIAGNOSIS — M545 Low back pain, unspecified: Secondary | ICD-10-CM

## 2018-10-01 MED ORDER — HYDROCODONE-ACETAMINOPHEN 5-325 MG PO TABS: 1.0000 | ORAL_TABLET | Freq: Four times a day (QID) | ORAL | 0 refills | Status: DC | PRN

## 2018-10-01 NOTE — Telephone Encounter (Signed)
Please review. Thanks!  

## 2018-10-01 NOTE — Telephone Encounter (Signed)
Needs a new rx for her  Hydrocodone 5-325  Kristopher Oppenheim  CB# [phone]  Thanks [place]

## 2018-10-31 ENCOUNTER — Ambulatory Visit: Payer: Self-pay | Admitting: Family Medicine

## 2018-12-18 ENCOUNTER — Telehealth: Payer: Self-pay

## 2018-12-18 NOTE — Telephone Encounter (Signed)
Patient requesting refill on the following medication: HYDROcodone-acetaminophen (NORCO/VICODIN) 5-325 MG tablet   Pharmacy:[facility] [place], [place]

## 2018-12-20 ENCOUNTER — Telehealth: Payer: Self-pay

## 2018-12-20 NOTE — Telephone Encounter (Signed)
Patient is requesting a refill on Hydrocodone.

## 2018-12-21 ENCOUNTER — Other Ambulatory Visit: Payer: Self-pay | Admitting: *Deleted

## 2018-12-21 DIAGNOSIS — M545 Low back pain, unspecified: Secondary | ICD-10-CM

## 2018-12-21 DIAGNOSIS — G8929 Other chronic pain: Secondary | ICD-10-CM

## 2018-12-21 MED ORDER — HYDROCODONE-ACETAMINOPHEN 5-325 MG PO TABS: 1.0000 | ORAL_TABLET | Freq: Four times a day (QID) | ORAL | 0 refills | Status: DC | PRN

## 2018-12-21 NOTE — Telephone Encounter (Signed)
Patient has called twice this week requesting a refill for Hydrocodone-ace 5-325 mg. Rx has not been sent in yet. Patient is requesting another provider fill rx. Please advise? Kristopher Oppenheim

## 2019-01-03 NOTE — Telephone Encounter (Signed)
I need you to put this in as a refill for me to sign--thanks

## 2019-02-12 ENCOUNTER — Other Ambulatory Visit: Payer: Self-pay | Admitting: Family Medicine

## 2019-02-12 DIAGNOSIS — G8929 Other chronic pain: Secondary | ICD-10-CM

## 2019-02-12 DIAGNOSIS — M545 Low back pain, unspecified: Secondary | ICD-10-CM

## 2019-02-12 NOTE — Telephone Encounter (Signed)
Please review. Thanks!  

## 2019-02-12 NOTE — Telephone Encounter (Signed)
Pt needing a refill on:  HYDROcodone-acetaminophen (NORCO/VICODIN) 5-325 MG tablet  Please fill at:  [place], [place] - [place] [phone] (Phone) [phone] (Fax)   Thanks, [employer]

## 2019-02-14 MED ORDER — HYDROCODONE-ACETAMINOPHEN 5-325 MG PO TABS: 1.0000 | ORAL_TABLET | Freq: Four times a day (QID) | ORAL | 0 refills | Status: DC | PRN

## 2019-04-15 ENCOUNTER — Other Ambulatory Visit: Payer: Self-pay | Admitting: Family Medicine

## 2019-04-15 DIAGNOSIS — G8929 Other chronic pain: Secondary | ICD-10-CM

## 2019-04-15 DIAGNOSIS — M545 Low back pain, unspecified: Secondary | ICD-10-CM

## 2019-04-15 NOTE — Telephone Encounter (Signed)
Pt needing a refill on:  HYDROcodone-acetaminophen (NORCO/VICODIN) 5-325 MG tablet  Please fill at:  [place], [place] - [place] [phone] (Phone) [phone] (Fax)   Thanks, [employer]

## 2019-04-16 MED ORDER — HYDROCODONE-ACETAMINOPHEN 5-325 MG PO TABS: 1.0000 | ORAL_TABLET | Freq: Four times a day (QID) | ORAL | 0 refills | Status: DC | PRN

## 2019-04-23 DIAGNOSIS — Z23 Encounter for immunization: Secondary | ICD-10-CM | POA: Diagnosis not present

## 2019-06-05 ENCOUNTER — Other Ambulatory Visit: Payer: Self-pay

## 2019-06-11 ENCOUNTER — Other Ambulatory Visit: Payer: Self-pay | Admitting: Family Medicine

## 2019-06-11 DIAGNOSIS — G8929 Other chronic pain: Secondary | ICD-10-CM

## 2019-06-11 DIAGNOSIS — M545 Low back pain, unspecified: Secondary | ICD-10-CM

## 2019-06-11 MED ORDER — HYDROCODONE-ACETAMINOPHEN 5-325 MG PO TABS: 1.0000 | ORAL_TABLET | Freq: Four times a day (QID) | ORAL | 0 refills | Status: DC | PRN

## 2019-06-11 NOTE — Telephone Encounter (Signed)
Pt is requesting a refill for HYDROcodone-acetaminophen (NORCO/VICODIN) 5-325 MG tablet   Pharmacy: [place], [place]

## 2019-06-11 NOTE — Telephone Encounter (Signed)
From [facility] 

## 2019-06-11 NOTE — Telephone Encounter (Signed)
Requested medication (s) are due for refill today: yes  Requested medication (s) are on the active medication list: yes  Last refill:  04/16/2019  Future visit scheduled: no  Notes to clinic:  Refill cannot be delegated    Requested Prescriptions  Pending Prescriptions Disp Refills   HYDROcodone-acetaminophen (NORCO/VICODIN) 5-325 MG tablet [Pharmacy Med Name: HYDROCODONE-APAP 5-325 MG TAB] 120 tablet     Sig: TAKE 1 TABLET BY MOUTH EVERY 6 HOURS AS NEEDED FOR MODERATE PAIN     Not Delegated - Analgesics:  Opioid Agonist Combinations Failed - 06/11/2019 11:41 AM      Failed - This refill cannot be delegated      Failed - Urine Drug Screen completed in last 360 days.      Failed - Valid encounter within last 6 months    Recent Outpatient Visits          1 year ago Need for influenza vaccination   [facility] Jerrol Banana., MD   1 year ago Weight loss   [facility] Jerrol Banana., MD   1 year ago Nausea   [facility] Jerrol Banana., MD   2 years ago Chronic low back pain, unspecified back pain laterality, with sciatica presence unspecified   [facility] Jerrol Banana., MD   2 years ago Essential (primary) hypertension   [facility] Jerrol Banana., MD

## 2019-06-12 ENCOUNTER — Telehealth: Payer: Self-pay | Admitting: Family Medicine

## 2019-06-12 NOTE — Telephone Encounter (Signed)
Discard second Rx--thanks

## 2019-06-12 NOTE — Telephone Encounter (Signed)
Kennyth Lose calling from pharmacy called and stated that she received two Rx's for  HYDROcodone-acetaminophen (NORCO/VICODIN) 5-325 MG tablet [[id number]] and would like to know if this is a duplicate. Please advise

## 2019-06-12 NOTE — Telephone Encounter (Signed)
Called the pharmacy and advised as directed.

## 2019-06-12 NOTE — Telephone Encounter (Signed)
From [facility] 

## 2019-08-07 ENCOUNTER — Ambulatory Visit (INDEPENDENT_AMBULATORY_CARE_PROVIDER_SITE_OTHER): Payer: Medicare Other | Admitting: Family Medicine

## 2019-08-07 ENCOUNTER — Encounter: Payer: Self-pay | Admitting: Family Medicine

## 2019-08-07 DIAGNOSIS — I10 Essential (primary) hypertension: Secondary | ICD-10-CM | POA: Diagnosis not present

## 2019-08-07 DIAGNOSIS — G8929 Other chronic pain: Secondary | ICD-10-CM | POA: Diagnosis not present

## 2019-08-07 DIAGNOSIS — M545 Low back pain: Secondary | ICD-10-CM

## 2019-08-07 DIAGNOSIS — I4891 Unspecified atrial fibrillation: Secondary | ICD-10-CM

## 2019-08-07 DIAGNOSIS — J438 Other emphysema: Secondary | ICD-10-CM | POA: Diagnosis not present

## 2019-08-07 MED ORDER — HYDROCODONE-ACETAMINOPHEN 5-325 MG PO TABS: ORAL_TABLET | ORAL | 0 refills | Status: DC

## 2019-08-07 MED ORDER — LOSARTAN POTASSIUM-HCTZ 100-25 MG PO TABS: ORAL_TABLET | ORAL | 11 refills | Status: DC

## 2019-08-07 NOTE — Progress Notes (Signed)
Patient: [name] [name] Female    DOB: December 22, 1929   86 y.o.   MRN: [id number] Visit Date: 08/07/2019  Today's Provider: Wilhemena Durie, MD   No chief complaint on file.  Subjective:    Virtual Visit via Telephone Note  I connected with [name] [name] on 08/07/19 at 10:40 AM EST by telephone and verified that I am speaking with the correct person using two identifiers.  Location: Patient: Home Provider: Office   I discussed the limitations, risks, security and privacy concerns of performing an evaluation and management service by telephone and the availability of in person appointments. I also discussed with the patient that there may be a patient responsible charge related to this service. The patient expressed understanding and agreed to proceed.   HPI 86 year old who lives alone has been doing well.  I last saw her in October 2019.  She has been staying completely at home during the Covid pandemic.  She no longer drives.  She has a housekeeper that comes in twice a month and her son and daughter bring her groceries.  She does not leave her house.  She has had no falls and overall is doing fairly well.  She states she has not no appetite but continues to eat because she knows she needs her strength.  She states her weight has been stable at about 118. Allergies  Allergen Reactions  . Ciprofloxacin     Other reaction(s): Unknown  . Macrobid  [Nitrofurantoin]   . Metoprolol     feet swelling/pain  . Sulfa Antibiotics   . Sulfonamide Derivatives      Current Outpatient Medications:  .  Fluticasone-Salmeterol (ADVAIR) 250-50 MCG/DOSE AEPB, INHALE ONE PUFF BY MOUTH EVERY 12 HOURS. BREATHE IN STEADILY AND DEEPLY. RINSE MOUTH AFTER USE., Disp: 60 each, Rfl: 11 .  HYDROcodone-acetaminophen (NORCO/VICODIN) 5-325 MG tablet, TAKE 1 TABLET BY MOUTH EVERY 6 HOURS AS NEEDED FOR MODERATE PAIN, Disp: 120 tablet, Rfl: 0 .  HYDROcodone-acetaminophen (NORCO/VICODIN) 5-325 MG  tablet, Take 1 tablet by mouth every 6 (six) hours as needed for moderate pain. 11/18/17, Disp: 120 tablet, Rfl: 0 .  losartan-hydrochlorothiazide (HYZAAR) 100-25 MG tablet, TAKE 1 TABLET BY MOUTH DAILY USUALLY IN THE MORINING, Disp: 30 tablet, Rfl: 11 .  Multiple Vitamins-Calcium (VIACTIV MULTI-VITAMIN) CHEW, Chew 1 tablet by mouth 2 (two) times daily., Disp: , Rfl:  .  ondansetron (ZOFRAN) 4 MG tablet, Take 1 tablet (4 mg total) by mouth every 8 (eight) hours as needed for nausea or vomiting., Disp: 60 tablet, Rfl: 1 .  pantoprazole (PROTONIX) 40 MG tablet, Take 1 tablet (40 mg total) by mouth daily., Disp: 30 tablet, Rfl: 12 .  Probiotic Product (PROBIOTIC DAILY) CAPS, Take by mouth., Disp: , Rfl:   Review of Systems  Constitutional: Negative for appetite change, chills, fatigue and fever.  Respiratory: Negative for chest tightness and shortness of breath.   Cardiovascular: Negative for chest pain and palpitations.  Gastrointestinal: Negative for abdominal pain, nausea and vomiting.  Neurological: Negative for dizziness and weakness.    Social History   Tobacco Use  . Smoking status: Former Smoker    Quit date: 07/11/2002    Years since quitting: 17.0  . Smokeless tobacco: Never Used  Substance Use Topics  . Alcohol use: Yes    Alcohol/week: 1.0 standard drinks    Types: 1 Glasses of wine per week    Comment: occasionally      Objective:   There  were no vitals taken for this visit. There were no vitals filed for this visit.There is no height or weight on file to calculate BMI.   Physical Exam   No results found for any visits on 08/07/19.     Assessment & Plan     1. Chronic low back pain Refill pain medications.  She has been taking this infrequently. - HYDROcodone-acetaminophen (NORCO/VICODIN) 5-325 MG tablet; TAKE 1 TABLET BY MOUTH EVERY 6 HOURS AS NEEDED FOR MODERATE PAIN  Dispense: 120 tablet; Refill: 0  2. Essential hypertension Refill medications.  I will see  her back in 3 months. - losartan-hydrochlorothiazide (HYZAAR) 100-25 MG tablet; TAKE 1 TABLET BY MOUTH DAILY USUALLY IN THE MORINING  Dispense: 30 tablet; Refill: 11  3. Atrial fibrillation, unspecified type ([place])   4. EMPHYSEMA She has not had her Covid vaccine yet.  I advised her to get it when she could.  I discussed the assessment and treatment plan with the patient. The patient was provided an opportunity to ask questions and all were answered. The patient agreed with the plan and demonstrated an understanding of the instructions.   The patient was advised to call back or seek an in-person evaluation if the symptoms worsen or if the condition fails to improve as anticipated.  I provided 11 minutes of non-face-to-face time during this encounter.    Richard Cranford Mon, MD  [facility]

## 2019-10-03 ENCOUNTER — Other Ambulatory Visit: Payer: Self-pay | Admitting: Family Medicine

## 2019-10-03 DIAGNOSIS — G8929 Other chronic pain: Secondary | ICD-10-CM

## 2019-10-03 NOTE — Telephone Encounter (Signed)
Medication Refill - Medication: HYDROcodone-acetaminophen (NORCO/VICODIN) 5-325 MG tablet  Has the patient contacted their pharmacy? No - states she must call it in montly (Agent: If no, request that the patient contact the pharmacy for the refill.) (Agent: If yes, when and what did the pharmacy advise?)  Preferred Pharmacy (with phone number or street name):  [place], [place] - [place] Phone:  [phone]  Fax:  [phone]     Agent: Please be advised that RX refills may take up to 3 business days. We ask that you follow-up with your pharmacy.

## 2019-10-03 NOTE — Telephone Encounter (Signed)
LOV  08/07/19  LRF  08/07/19  # 120

## 2019-10-04 ENCOUNTER — Other Ambulatory Visit: Payer: Self-pay | Admitting: Family Medicine

## 2019-10-04 ENCOUNTER — Telehealth: Payer: Self-pay | Admitting: Family Medicine

## 2019-10-04 DIAGNOSIS — G8929 Other chronic pain: Secondary | ICD-10-CM

## 2019-10-04 MED ORDER — HYDROCODONE-ACETAMINOPHEN 5-325 MG PO TABS: 1.0000 | ORAL_TABLET | Freq: Four times a day (QID) | ORAL | 0 refills | Status: DC | PRN

## 2019-10-04 NOTE — Telephone Encounter (Signed)
LMOVM for [facility] to return call.

## 2019-10-04 NOTE — Telephone Encounter (Signed)
[facility] pharmacy called in for assistance with the directions on pt's Rx for HYDROcodone-acetaminophen (NORCO/VICODIN) 5-325 MG tablet .  Its showing a old fill date. Pharmacy would like to have updated to fill for pt.   Please assist.

## 2019-10-04 NOTE — Telephone Encounter (Signed)
This is I think the third request in less than 24 hours.  However it won't let me eliminate them.  Says I do not have authority.

## 2019-10-07 ENCOUNTER — Other Ambulatory Visit: Payer: Self-pay | Admitting: Family Medicine

## 2019-10-07 DIAGNOSIS — R110 Nausea: Secondary | ICD-10-CM

## 2019-10-07 NOTE — Telephone Encounter (Signed)
Requested medication (s) are due for refill today: no  Requested medication (s) are on the active medication list: no  Last refill:  09/18/17 #60 with 1 refill  Future visit scheduled: no  Notes to clinic:  Refill not delegated per protocol    Requested Prescriptions  Pending Prescriptions Disp Refills   ondansetron (ZOFRAN) 4 MG tablet 60 tablet 1    Sig: Take 1 tablet (4 mg total) by mouth every 8 (eight) hours as needed for nausea or vomiting.      Not Delegated - Gastroenterology: Antiemetics Failed - 10/07/2019  4:00 PM      Failed - This refill cannot be delegated      Passed - Valid encounter within last 6 months    Recent Outpatient Visits           2 months ago Essential hypertension   [facility] Jerrol Banana., MD   1 year ago Need for influenza vaccination   [facility] Jerrol Banana., MD   1 year ago Weight loss   [facility] Jerrol Banana., MD   2 years ago Nausea   [facility] Jerrol Banana., MD   2 years ago Chronic low back pain, unspecified back pain laterality, with sciatica presence unspecified   [facility] Jerrol Banana., MD

## 2019-10-07 NOTE — Telephone Encounter (Signed)
Copied from [place] [id number]. Topic: Quick Communication - Rx Refill/Question >> Oct 07, 2019  3:58 PM Izola Price, [place] A wrote: Medication: ondansetron (ZOFRAN) 4 MG tablet   Has the patient contacted their pharmacy? {Yes (Agent: If no, request that the patient contact the pharmacy for the refill.) (Agent: If yes, when and what did the pharmacy advise?)Contact PCP  Preferred Pharmacy (with phone number or street name): [place], [place] - [place]  Phone:  [phone] Fax:  [phone]     Agent: Please be advised that RX refills may take up to 3 business days. We ask that you follow-up with your pharmacy.

## 2019-10-08 MED ORDER — ONDANSETRON HCL 4 MG PO TABS: 4.0000 mg | ORAL_TABLET | Freq: Three times a day (TID) | ORAL | 1 refills | Status: DC | PRN

## 2019-10-08 NOTE — Telephone Encounter (Signed)
Please advise refill? Rx has not been filled in 2 years.

## 2019-10-17 NOTE — Telephone Encounter (Signed)
Returned call

## 2019-10-29 DIAGNOSIS — Z23 Encounter for immunization: Secondary | ICD-10-CM | POA: Diagnosis not present

## 2019-11-19 DIAGNOSIS — Z23 Encounter for immunization: Secondary | ICD-10-CM | POA: Diagnosis not present

## 2019-12-06 ENCOUNTER — Other Ambulatory Visit: Payer: Self-pay | Admitting: Family Medicine

## 2019-12-06 DIAGNOSIS — M545 Low back pain, unspecified: Secondary | ICD-10-CM

## 2019-12-06 NOTE — Telephone Encounter (Signed)
Medication Refill - Medication: HYDROcodone-acetaminophen (NORCO/VICODIN) 5-325 MG tablet [[id number]]      Preferred Pharmacy (with phone number or street name):  [place], [place] - [place]  [place] [address]  Phone: [phone] Fax: [phone]    Agent: Please be advised that RX refills may take up to 3 business days. We ask that you follow-up with your pharmacy.

## 2019-12-10 MED ORDER — HYDROCODONE-ACETAMINOPHEN 5-325 MG PO TABS: 1.0000 | ORAL_TABLET | Freq: Four times a day (QID) | ORAL | 0 refills | Status: DC | PRN

## 2020-01-06 ENCOUNTER — Other Ambulatory Visit: Payer: Self-pay | Admitting: Family Medicine

## 2020-01-06 DIAGNOSIS — J438 Other emphysema: Secondary | ICD-10-CM

## 2020-01-06 NOTE — Telephone Encounter (Signed)
Requested medication (s) are due for refill today: no  Requested medication (s) are on the active medication list: no  Last refill:  08/07/2019  Future visit scheduled: no  Notes to clinic:  this medication is not on current list Review for refill   Requested Prescriptions  Pending Prescriptions Disp Refills   [place] 250-50 MCG/DOSE AEPB [Pharmacy Med Name: WIXELA INHUB 250-50 MCG/DOSE INH AE] 60 each 11    Sig: TAKE ONE PUFF TWICE DAILY. RINSE MOUTH.      Pulmonology:  Combination Products Passed - 01/06/2020  2:12 PM      Passed - Valid encounter within last 12 months    Recent Outpatient Visits           5 months ago Essential hypertension   [facility] Jerrol Banana., MD   1 year ago Need for influenza vaccination   [facility] Jerrol Banana., MD   1 year ago Weight loss   [facility] Jerrol Banana., MD   2 years ago Nausea   [facility] Jerrol Banana., MD   2 years ago Chronic low back pain, unspecified back pain laterality, with sciatica presence unspecified   [facility] Jerrol Banana., MD

## 2020-02-04 ENCOUNTER — Telehealth: Payer: Self-pay | Admitting: *Deleted

## 2020-02-04 ENCOUNTER — Other Ambulatory Visit: Payer: Self-pay | Admitting: Family Medicine

## 2020-02-04 DIAGNOSIS — I10 Essential (primary) hypertension: Secondary | ICD-10-CM

## 2020-02-04 DIAGNOSIS — M545 Low back pain, unspecified: Secondary | ICD-10-CM

## 2020-02-04 MED ORDER — LOSARTAN POTASSIUM-HCTZ 100-25 MG PO TABS: ORAL_TABLET | ORAL | 11 refills | Status: DC

## 2020-02-04 NOTE — Telephone Encounter (Signed)
Requested medication (s) are due for refill today: yes  Requested medication (s) are on the active medication list: yes  Last refill:  12/10/2019  Future visit scheduled: yes  Notes to clinic:  this refill cannot be delegated    Requested Prescriptions  Pending Prescriptions Disp Refills   HYDROcodone-acetaminophen (NORCO/VICODIN) 5-325 MG tablet [Pharmacy Med Name: HYDROCODONE-APAP 5-325 MG TAB] 120 tablet     Sig: TAKE 1 TABLET BY MOUTH EVERY 6 HOURS AS NEEDED FOR MODERATE PAIN      Not Delegated - Analgesics:  Opioid Agonist Combinations Failed - 02/04/2020 10:47 AM      Failed - This refill cannot be delegated      Failed - Urine Drug Screen completed in last 360 days.      Failed - Valid encounter within last 6 months    Recent Outpatient Visits           6 months ago Essential hypertension   [facility] Jerrol Banana., MD   1 year ago Need for influenza vaccination   [facility] Jerrol Banana., MD   1 year ago Weight loss   [facility] Jerrol Banana., MD   2 years ago Nausea   [facility] Jerrol Banana., MD   2 years ago Chronic low back pain, unspecified back pain laterality, with sciatica presence unspecified   [facility] Jerrol Banana., MD

## 2020-02-04 NOTE — Telephone Encounter (Signed)
I called her.   Appt made for 02/11/2020 as a virtual visit at her request with Dr. Rosanna Randy at 3:40.  She does not use computers.  COVID-19 questionnaire completed. Losartan refill given

## 2020-02-04 NOTE — Telephone Encounter (Signed)
RX REFILL losartan-hydrochlorothiazide (HYZAAR) 100-25 MG tablet [[id number]]  [place], [place] Phone:  [phone]  Fax:  [phone]

## 2020-02-11 ENCOUNTER — Ambulatory Visit (INDEPENDENT_AMBULATORY_CARE_PROVIDER_SITE_OTHER): Payer: Medicare Other | Admitting: Family Medicine

## 2020-02-11 DIAGNOSIS — I4891 Unspecified atrial fibrillation: Secondary | ICD-10-CM | POA: Diagnosis not present

## 2020-02-11 DIAGNOSIS — J449 Chronic obstructive pulmonary disease, unspecified: Secondary | ICD-10-CM | POA: Diagnosis not present

## 2020-02-11 DIAGNOSIS — E559 Vitamin D deficiency, unspecified: Secondary | ICD-10-CM

## 2020-02-11 DIAGNOSIS — M479 Spondylosis, unspecified: Secondary | ICD-10-CM

## 2020-02-11 DIAGNOSIS — R4189 Other symptoms and signs involving cognitive functions and awareness: Secondary | ICD-10-CM

## 2020-02-11 DIAGNOSIS — E785 Hyperlipidemia, unspecified: Secondary | ICD-10-CM

## 2020-02-11 DIAGNOSIS — I10 Essential (primary) hypertension: Secondary | ICD-10-CM

## 2020-02-11 DIAGNOSIS — K22710 Barrett's esophagus with low grade dysplasia: Secondary | ICD-10-CM | POA: Diagnosis not present

## 2020-02-11 NOTE — Progress Notes (Signed)
Virtual Visit via Telephone Note  I connected with [name] [name] on 02/11/20 at  3:40 PM EDT by telephone and verified that I am speaking with the correct person using two identifiers.  Location: Patient: Home Provider: Office   I discussed the limitations, risks, security and privacy concerns of performing an evaluation and management service by telephone and the availability of in person appointments. I also discussed with the patient that there may be a patient responsible charge related to this service. The patient expressed understanding and agreed to proceed.   History of Present Illness: Patient was told to make a appointment office visit.  He does not have computer capabilities.  She is not having any problems except for decreased appetite.  She has lost 6 pounds to weight down to 106 pounds. She has had both Covid vaccines.  She is ambulating well around the house.  She has had no recent falls. She is taking the hydrocodone twice a day, mainly 2 doses and night and during the middle of the night when she wakes up.  She states she does not walk much after taking these.  She has no issues to discuss or that concern her today. Observations/Objective:   Assessment and Plan:  1. Essential hypertension Controlled  2. Atrial fibrillation, unspecified type ([place])   3. Chronic obstructive pulmonary disease, unspecified COPD type ([place]) Patient breathing well as long as she is not in the heat or the cold  4. Barrett's esophagus with low grade dysplasia   5. Hyperlipidemia, unspecified hyperlipidemia type   6. Avitaminosis D   7. Cognitive decline Discussed fall risk.  8. Osteoarthritis of spine, unspecified spinal osteoarthritis complication status, unspecified spinal region We will adjust her prescription to twice a day on the hydrocodone and then try to cut back on the dose to one half twice a day and then try to get her off of it to lower her fall risk. Appointment  this fall for flu shot.  Follow Up Instructions:    I discussed the assessment and treatment plan with the patient. The patient was provided an opportunity to ask questions and all were answered. The patient agreed with the plan and demonstrated an understanding of the instructions.   The patient was advised to call back or seek an in-person evaluation if the symptoms worsen or if the condition fails to improve as anticipated.  I provided 12 minutes of non-face-to-face time during this encounter.   [name] Lwin Cranford Mon, MD   Virtual telephone visit   I discussed the limitations of evaluation and management by telemedicine and the availability of in person appointments. The patient expressed understanding and agreed to proceed.   Virtual Visit via Telephone Note   This visit type was conducted due to national recommendations for restrictions regarding the COVID-19 Pandemic (e.g. social distancing) in an effort to limit this patient's exposure and mitigate transmission in our community. Due to her co-morbid illnesses, this patient is at least at moderate risk for complications without adequate follow up. This format is felt to be most appropriate for this patient at this time. The patient did not have access to video technology or had technical difficulties with video requiring transitioning to audio format only (telephone). Physical exam was limited to content and character of the telephone converstion.    Patient location: home Provider location: office   Visit Date: 02/11/2020  Today's healthcare provider: Wilhemena Durie, MD   No chief complaint on file.  Subjective  HPI   Hypertension, follow-up  BP Readings from Last 3 Encounters:  04/24/18 (!) 145/83  02/19/18 128/84  09/18/17 (!) 150/72   Wt Readings from Last 3 Encounters:  04/24/18 123 lb (55.8 kg)  02/19/18 119 lb (54 kg)  05/18/17 136 lb 6.4 oz (61.9 kg)     She was last seen for hypertension on  08/07/19. No changes in treatment.   She reports excellent compliance with treatment. She is not having side effects.    Pertinent labs: Lab Results  Component Value Date   CHOL 204 (A) 05/28/2013   HDL 51 05/28/2013   LDLCALC 133 05/28/2013   TRIG 98 05/28/2013   Lab Results  Component Value Date   NA 147 (H) 02/19/2018   K 4.4 02/19/2018   CREATININE 1.34 (H) 02/19/2018   GFRNONAA 36 (L) 02/19/2018   GFRAA 41 (L) 02/19/2018   GLUCOSE 76 02/19/2018     The ASCVD Risk score (Goff DC Jr., et al., 2013) failed to calculate for the following reasons:   The 2013 ASCVD risk score is only valid for ages 39 to 24   ---------------------------------------------------------------------------------------------------  Patient does complain of having decreased appetite.  She states if she didn't have to eat she wouldn't.  She reports some abdominal pain in the mornings but thinks it is only coming from gas.     Medications: Outpatient Medications Prior to Visit  Medication Sig  . HYDROcodone-acetaminophen (NORCO/VICODIN) 5-325 MG tablet TAKE 1 TABLET BY MOUTH EVERY 6 HOURS AS NEEDED FOR MODERATE PAIN  . HYDROcodone-acetaminophen (NORCO/VICODIN) 5-325 MG tablet TAKE 1 TABLET BY MOUTH EVERY 6 HOURS AS NEEDED FOR MODERATE PAIN  . losartan-hydrochlorothiazide (HYZAAR) 100-25 MG tablet TAKE 1 TABLET BY MOUTH DAILY USUALLY IN THE MORINING  . Multiple Vitamins-Calcium (VIACTIV MULTI-VITAMIN) CHEW Chew 1 tablet by mouth 2 (two) times daily.  . ondansetron (ZOFRAN) 4 MG tablet Take 1 tablet (4 mg total) by mouth every 8 (eight) hours as needed for nausea or vomiting.  . Probiotic Product (PROBIOTIC DAILY) CAPS Take by mouth.  Grant Ruts INHUB 250-50 MCG/DOSE AEPB TAKE ONE PUFF TWICE DAILY. RINSE MOUTH.  . pantoprazole (PROTONIX) 40 MG tablet Take 1 tablet (40 mg total) by mouth daily. (Patient not taking: Reported on 08/07/2019)   No facility-administered medications prior to visit.    Review of  Systems  Constitutional: Positive for appetite change. Negative for fatigue and fever.  Respiratory: Positive for shortness of breath (chronic unchanged). Negative for cough.   Cardiovascular: Negative for chest pain, palpitations and leg swelling.  Neurological: Negative for dizziness and headaches.       Objective    There were no vitals taken for this visit.      Assessment & Plan       No follow-ups on file.    I discussed the assessment and treatment plan with the patient. The patient was provided an opportunity to ask questions and all were answered. The patient agreed with the plan and demonstrated an understanding of the instructions.   The patient was advised to call back or seek an in-person evaluation if the symptoms worsen or if the condition fails to improve as anticipated.  I provided 12 minutes of non-face-to-face time during this encounter.    Maylee Bare Cranford Mon, MD [facility] [phone] (phone) [phone] (fax)  [place]

## 2020-04-02 ENCOUNTER — Other Ambulatory Visit: Payer: Self-pay | Admitting: Family Medicine

## 2020-04-02 DIAGNOSIS — G8929 Other chronic pain: Secondary | ICD-10-CM

## 2020-04-02 NOTE — Telephone Encounter (Signed)
PT need a refill  HYDROcodone-acetaminophen (NORCO/VICODIN) 5-325 MG tablet [[id number]]  [facility] - [place], [address]  [place] [address]  Phone: [phone] Fax: [phone]

## 2020-04-02 NOTE — Telephone Encounter (Signed)
Requested medication (s) are due for refill today: yes  Requested medication (s) are on the active medication list: yes  Last refill:  02/06/20  Future visit scheduled: no  Notes to clinic:  not delegated    Requested Prescriptions  Pending Prescriptions Disp Refills   HYDROcodone-acetaminophen (NORCO/VICODIN) 5-325 MG tablet 120 tablet 0    Sig: TAKE 1 TABLET BY MOUTH EVERY 6 HOURS AS NEEDED FOR MODERATE PAIN      Not Delegated - Analgesics:  Opioid Agonist Combinations Failed - 04/02/2020  9:47 AM      Failed - This refill cannot be delegated      Failed - Urine Drug Screen completed in last 360 days.      Failed - Valid encounter within last 6 months    Recent Outpatient Visits           1 month ago Essential hypertension   [facility] Jerrol Banana., MD   7 months ago Essential hypertension   [facility] Jerrol Banana., MD   1 year ago Need for influenza vaccination   [facility] Jerrol Banana., MD   2 years ago Weight loss   [facility] Jerrol Banana., MD   2 years ago Nausea   [facility] Jerrol Banana., MD

## 2020-04-06 ENCOUNTER — Other Ambulatory Visit: Payer: Self-pay | Admitting: Family Medicine

## 2020-04-06 DIAGNOSIS — M545 Low back pain, unspecified: Secondary | ICD-10-CM

## 2020-04-06 NOTE — Telephone Encounter (Signed)
PT need a refill  HYDROcodone-acetaminophen (NORCO/VICODIN) 5-325 MG tablet [[id number]]  [facility] - [place], [place] [address]  [place] [address]  Phone: [phone] Fax: [phone]

## 2020-04-06 NOTE — Telephone Encounter (Signed)
Requested medication (s) are due for refill today: Yes  Requested medication (s) are on the active medication list: Yes  Last refill:  08/07/19  Future visit scheduled: No  Notes to clinic:  Unable to refill per protocol, cannot delegate     Requested Prescriptions  Pending Prescriptions Disp Refills   HYDROcodone-acetaminophen (NORCO/VICODIN) 5-325 MG tablet 120 tablet 0    Sig: TAKE 1 TABLET BY MOUTH EVERY 6 HOURS AS NEEDED FOR MODERATE PAIN      Not Delegated - Analgesics:  Opioid Agonist Combinations Failed - 04/06/2020  4:30 PM      Failed - This refill cannot be delegated      Failed - Urine Drug Screen completed in last 360 days.      Failed - Valid encounter within last 6 months    Recent Outpatient Visits           1 month ago Essential hypertension   [facility] Jerrol Banana., MD   8 months ago Essential hypertension   [facility] Jerrol Ban[facility]., MD   1 year ago Need for influenza vaccination   [facility] Jerrol Banana., MD   2 years ago Weight loss   [facility] Jerrol Banana., MD   2 years ago Nausea   [facility] Jerrol Banana., MD

## 2020-04-07 MED ORDER — HYDROCODONE-ACETAMINOPHEN 5-325 MG PO TABS: ORAL_TABLET | ORAL | 0 refills | Status: DC

## 2020-04-09 MED ORDER — HYDROCODONE-ACETAMINOPHEN 5-325 MG PO TABS: ORAL_TABLET | ORAL | 0 refills | Status: DC

## 2020-06-01 ENCOUNTER — Other Ambulatory Visit: Payer: Self-pay | Admitting: Family Medicine

## 2020-06-01 DIAGNOSIS — G8929 Other chronic pain: Secondary | ICD-10-CM

## 2020-06-01 DIAGNOSIS — M545 Low back pain, unspecified: Secondary | ICD-10-CM

## 2020-06-01 NOTE — Telephone Encounter (Signed)
Medication Refill - Medication: HYDROcodone-acetaminophen (NORCO/VICODIN) 5-325 MG tablet     Preferred Pharmacy (with phone number or street name):  [place], [place] - [address] Phone:  [phone]  Fax:  [phone]       Agent: Please be advised that RX refills may take up to 3 business days. We ask that you follow-up with your pharmacy.

## 2020-06-01 NOTE — Telephone Encounter (Signed)
Requested medication (s) are due for refill today: 2 orders written  Requested medication (s) are on the active medication list: yes  Last refill:  04/07/20 and 04/09/20 #120 0 refills  Future visit scheduled: no  Notes to clinic:  2 orders written for Rx one last refill date for 04/07/20 and the other for 04/09/20. Rx not delegated per protocol.     Requested Prescriptions  Pending Prescriptions Disp Refills   HYDROcodone-acetaminophen (NORCO/VICODIN) 5-325 MG tablet 120 tablet 0    Sig: TAKE 1 TABLET BY MOUTH EVERY 6 HOURS AS NEEDED FOR MODERATE PAIN      Not Delegated - Analgesics:  Opioid Agonist Combinations Failed - 06/01/2020  4:01 PM      Failed - This refill cannot be delegated      Failed - Urine Drug Screen completed in last 360 days      Failed - Valid encounter within last 6 months    Recent Outpatient Visits           3 months ago Essential hypertension   [facility] Jerrol Banana., MD   9 months ago Essential hypertension   [facility] Jer[facility]   2 years ago Need for influenza vaccination   [facility] Jerrol Banana., MD   2 years ago Weight loss   [facility] Jerrol Banana., MD   2 years ago Nausea   [facility] Jerrol Banana., MD

## 2020-06-02 MED ORDER — HYDROCODONE-ACETAMINOPHEN 5-325 MG PO TABS: ORAL_TABLET | ORAL | 0 refills | Status: DC

## 2020-08-03 ENCOUNTER — Other Ambulatory Visit: Payer: Self-pay | Admitting: Family Medicine

## 2020-08-03 DIAGNOSIS — G8929 Other chronic pain: Secondary | ICD-10-CM

## 2020-08-03 DIAGNOSIS — M545 Low back pain, unspecified: Secondary | ICD-10-CM

## 2020-08-03 NOTE — Telephone Encounter (Signed)
Requested medication (s) are due for refill today: no  Requested medication (s) are on the active medication list: yes  Last refill:  06/02/2020  Future visit scheduled: no  Notes to clinic: this refill cannot be delegated    Requested Prescriptions  Pending Prescriptions Disp Refills   HYDROcodone-acetaminophen (NORCO/VICODIN) 5-325 MG tablet [Pharmacy Med Name: HYDROCODONE-APAP 5-325 MG TAB] 120 tablet     Sig: TAKE ONE TABLET BY MOUTH EVERY 6 HOURS AS NEEDED FOR MODERATE PAIN      Not Delegated - Analgesics:  Opioid Agonist Combinations Failed - 08/03/2020  9:19 AM      Failed - This refill cannot be delegated      Failed - Urine Drug Screen completed in last 360 days      Failed - Valid encounter within last 6 months    Recent Outpatient Visits           5 months ago Essential hypertension   [facility] Jerrol Banana., MD   12 months ago Essential hypertension   [facility] Jerrol Banana., MD   2 years ago Need for influenza vaccination   [facility] Jerrol Banana., MD   2 years ago Weight loss   [facility] Jerrol Banana., MD   2 years ago Nausea   [facility] Jerrol Banana., MD

## 2020-12-02 ENCOUNTER — Other Ambulatory Visit: Payer: Self-pay | Admitting: Family Medicine

## 2020-12-02 NOTE — Telephone Encounter (Signed)
I will refill prescription but patient must schedule appointment for summer/fall

## 2020-12-02 NOTE — Telephone Encounter (Signed)
Requested medication (s) are due for refill today - yes  Requested medication (s) are on the active medication list -yes  Future visit scheduled -no  Last refill: 08/05/20  Notes to clinic: Request non delegated Rx  Requested Prescriptions  Pending Prescriptions Disp Refills   HYDROcodone-acetaminophen (NORCO/VICODIN) 5-325 MG tablet [Pharmacy Med Name: HYDROCODONE-APAP 5-325 MG TAB] 120 tablet     Sig: TAKE ONE TABLET BY MOUTH EVERY 6 HOURS AS NEEDED FOR MODERATE PAIN      Not Delegated - Analgesics:  Opioid Agonist Combinations Failed - 12/02/2020  9:35 AM      Failed - This refill cannot be delegated      Failed - Urine Drug Screen completed in last 360 days      Failed - Valid encounter within last 6 months    Recent Outpatient Visits           9 months ago Essential hypertension   [facility] Jerrol Banana., MD   1 year ago Essential hypertension   [facility] Jerrol Banana., MD   2 years ago Need for influenza vaccination   [facility] Jerrol Banana., MD   2 years ago Weight loss   [facility] Jerrol Ban[facility]   3 years ago Nausea   [facility] Jerrol Banana., MD                    Requested Prescriptions  Pending Prescriptions Disp Refills   HYDROcodone-acetaminophen (NORCO/VICODIN) 5-325 MG tablet [Pharmacy Med Name: HYDROCODONE-APAP 5-325 MG TAB] 120 tablet     Sig: TAKE ONE TABLET BY MOUTH EVERY 6 HOURS AS NEEDED FOR MODERATE PAIN      Not Delegated - Analgesics:  Opioid Agonist Combinations Failed - 12/02/2020  9:35 AM      Failed - This refill cannot be delegated      Failed - Urine Drug Screen completed in last 360 days      Failed - Valid encounter within last 6 months    Recent Outpatient Visits           9 months ago Essential hypertension   [facility] Jerrol Banana., MD   1 year ago Essential  hypertension   [facility] Jerrol Banana., MD   2 years ago Need for influenza vaccination   [facility] Jerrol Banana., MD   2 years ago Weight loss   [facility] Jerrol Banana., MD   3 years ago Nausea   [facility] Jerrol Banana., MD

## 2021-01-12 ENCOUNTER — Ambulatory Visit: Payer: Self-pay

## 2021-01-12 NOTE — Telephone Encounter (Signed)
Pt c/o bright red rectal bleeding x 3-4 episodes. Pt stated that she feels a bulging mass at her rectum and stated it gets big and after she sits on commode to let it drain it then is smaller. Pt stated 1 bleed was hard to get it stopped. Pt stated she has a h/o rectal polyp. Water was red. Denies diarrhea, or constipation.  Pt is having loss of appetite and dizziness if she gets ut of bed too fast. Pt stated the dizziness is not new and has not worsened.  Care advice given and pt verbalized understanding. Pt only wants to see Dr. Rosanna Randy. No available appt with 24 hours. Routing to office to assist with appointment. Dr Rosanna Randy first availability 02/02/21           Reason for Disposition  MODERATE rectal bleeding (small blood clots, passing blood without stool, or toilet water turns red)  Answer Assessment - Initial Assessment Questions 1. APPEARANCE of BLOOD: "What color is it?" "Is it passed separately, on the surface of the stool, or mixed in with the stool?"      Bright red 2. AMOUNT: "How much blood was passed?"      Large amount red water 3. FREQUENCY: "How many times has blood been passed with the stools?"      3-4 4. ONSET: "When was the blood first seen in the stools?" (Days or weeks) 2 weeks       5. DIARRHEA: "Is there also some diarrhea?" If Yes, ask: "How many diarrhea stools in the past 24 hours?"      no 6. CONSTIPATION: "Do you have constipation?" If Yes, ask: "How bad is it?"     no 7. RECURRENT SYMPTOMS: "Have you had blood in your stools before?" If Yes, ask: "When was the last time?" and "What happened that time?"      no 8. BLOOD THINNERS: "Do you take any blood thinners?" (e.g., Coumadin/warfarin, Pradaxa/dabigatran, aspirin)     no 9. OTHER SYMPTOMS: "Do you have any other symptoms?"  (e.g., abdomen pain, vomiting, dizziness, fever)   Loss of appetite, gas,  Protocols used: Rectal Bleeding-A-AH

## 2021-01-12 NOTE — Telephone Encounter (Signed)
Please review. Any recommendations?

## 2021-01-12 NOTE — Telephone Encounter (Signed)
Appt scheduled. Patient advised.

## 2021-01-13 ENCOUNTER — Other Ambulatory Visit: Payer: Self-pay

## 2021-01-13 ENCOUNTER — Ambulatory Visit (INDEPENDENT_AMBULATORY_CARE_PROVIDER_SITE_OTHER): Payer: Medicare Other | Admitting: Family Medicine

## 2021-01-13 ENCOUNTER — Encounter: Payer: Self-pay | Admitting: Family Medicine

## 2021-01-13 VITALS — BP 122/64 | HR 117 | Temp 98.3°F | Resp 20 | Ht 64.0 in | Wt 105.0 lb

## 2021-01-13 DIAGNOSIS — J449 Chronic obstructive pulmonary disease, unspecified: Secondary | ICD-10-CM | POA: Diagnosis not present

## 2021-01-13 DIAGNOSIS — R110 Nausea: Secondary | ICD-10-CM | POA: Diagnosis not present

## 2021-01-13 DIAGNOSIS — G8929 Other chronic pain: Secondary | ICD-10-CM | POA: Diagnosis not present

## 2021-01-13 DIAGNOSIS — I10 Essential (primary) hypertension: Secondary | ICD-10-CM

## 2021-01-13 DIAGNOSIS — K22710 Barrett's esophagus with low grade dysplasia: Secondary | ICD-10-CM | POA: Diagnosis not present

## 2021-01-13 DIAGNOSIS — M479 Spondylosis, unspecified: Secondary | ICD-10-CM | POA: Diagnosis not present

## 2021-01-13 DIAGNOSIS — K625 Hemorrhage of anus and rectum: Secondary | ICD-10-CM | POA: Diagnosis not present

## 2021-01-13 DIAGNOSIS — M545 Low back pain, unspecified: Secondary | ICD-10-CM

## 2021-01-13 MED ORDER — HYDROCODONE-ACETAMINOPHEN 5-325 MG PO TABS: ORAL_TABLET | ORAL | 0 refills | Status: DC

## 2021-01-13 MED ORDER — ONDANSETRON HCL 4 MG PO TABS: 4.0000 mg | ORAL_TABLET | Freq: Three times a day (TID) | ORAL | 1 refills | Status: AC | PRN

## 2021-01-13 NOTE — Progress Notes (Signed)
Established patient visit   Patient: [name] [name]   DOB: [birth year]   87 y.o. Female  MRN: [id number] Visit Date: 01/13/2021  Today's healthcare provider: Wilhemena Durie, MD   Chief Complaint  Patient presents with   Rectal Bleeding   Subjective    Rectal Bleeding    Pt c/o bright red rectal bleeding x 3-4 episodes. Pt stated that she feels a bulging mass at her rectum and stated it gets big and after she sits on commode to let it drain it then is smaller. Pt stated 1 bleed was hard to get it stopped. Pt stated she has a h/o rectal polyp. Water was red. Denies diarrhea, or constipation. Pt is having loss of appetite and dizziness if she gets ut of bed too fast. Pt stated the dizziness is not new and has not worsened. She overall is feeling fairly well.  She has been out very little during the [place] pandemic.  He has had 3 vaccines.       Medications: Outpatient Medications Prior to Visit  Medication Sig   HYDROcodone-acetaminophen (NORCO/VICODIN) 5-325 MG tablet TAKE 1 TABLET BY MOUTH EVERY 6 HOURS AS NEEDED FOR MODERATE PAIN   HYDROcodone-acetaminophen (NORCO/VICODIN) 5-325 MG tablet TAKE 1 TABLET BY MOUTH EVERY 6 HOURS AS NEEDED FOR MODERATE PAIN   HYDROcodone-acetaminophen (NORCO/VICODIN) 5-325 MG tablet TAKE ONE TABLET BY MOUTH EVERY 6 HOURS AS NEEDED FOR MODERATE PAIN   HYDROcodone-acetaminophen (NORCO/VICODIN) 5-325 MG tablet TAKE ONE TABLET BY MOUTH EVERY 6 HOURS AS NEEDED FOR MODERATE PAIN   losartan-hydrochlorothiazide (HYZAAR) 100-25 MG tablet TAKE 1 TABLET BY MOUTH DAILY USUALLY IN THE MORINING   Multiple Vitamins-Calcium (VIACTIV MULTI-VITAMIN) CHEW Chew 1 tablet by mouth 2 (two) times daily.   ondansetron (ZOFRAN) 4 MG tablet Take 1 tablet (4 mg total) by mouth every 8 (eight) hours as needed for nausea or vomiting.   pantoprazole (PROTONIX) 40 MG tablet Take 1 tablet (40 mg total) by mouth daily. (Patient not taking: Reported on 08/07/2019)   Probiotic  Product (PROBIOTIC DAILY) CAPS Take by mouth.   WIXELA INHUB 250-50 MCG/DOSE AEPB TAKE ONE PUFF TWICE DAILY. RINSE MOUTH.   No facility-administered medications prior to visit.    Review of Systems  Gastrointestinal:  Positive for hematochezia.       Objective    BP 122/64   Pulse (!) 117   Temp 98.3 F (36.8 C)   Resp 20   Ht 5\' 4"  (1.626 m)   Wt 105 lb (47.6 kg)   BMI 18.02 kg/m  BP Readings from Last 3 Encounters:  01/13/21 122/64  04/24/18 (!) 145/83  02/19/18 128/84   Wt Readings from Last 3 Encounters:  01/13/21 105 lb (47.6 kg)  04/24/18 123 lb (55.8 kg)  02/19/18 119 lb (54 kg)       Physical Exam Vitals reviewed.  Constitutional:      Appearance: She is well-developed.  HENT:     Head: Normocephalic and atraumatic.     Right Ear: External ear normal.     Left Ear: External ear normal.     Nose: Nose normal.  Eyes:     General: No scleral icterus.    Conjunctiva/sclera: Conjunctivae normal.  Neck:     Thyroid: No thyromegaly.  Cardiovascular:     Rate and Rhythm: Normal rate and regular rhythm.     Heart sounds: Normal heart sounds.  Pulmonary:     Effort: Pulmonary effort is normal.  Breath sounds: Normal breath sounds.  Abdominal:     Palpations: Abdomen is soft.  Genitourinary:    Rectum: Guaiac result positive.     Comments: Visual perianal exam is normal and rectal exam reveals appears to be a small mass that can be palpated in the rectum.  A gloved finger has  blood on it. Skin:    General: Skin is warm and dry.  Neurological:     Mental Status: She is alert and oriented to person, place, and time.  Psychiatric:        Behavior: Behavior normal.        Thought Content: Thought content normal.        Judgment: Judgment normal.      No results found for any visits on 01/13/21.  Assessment & Plan     1. Rectal bleeding Refer to surgery/Dr. Bary Castilla for evaluation and appropriate management for this 87 year old - CBC with  Differential/Platelet - Comprehensive metabolic panel - TSH - Ambulatory referral to General Surgery  2. Chronic low back pain Patient uses infrequent hydrocodone.  Refill today.  This will last her a half a year. - HYDROcodone-acetaminophen (NORCO/VICODIN) 5-325 MG tablet; TAKE 1 TABLET BY MOUTH EVERY 6 HOURS AS NEEDED FOR MODERATE PAIN  Dispense: 120 tablet; Refill: 0  3. Nausea She does not use this frequently. - ondansetron (ZOFRAN) 4 MG tablet; Take 1 tablet (4 mg total) by mouth every 8 (eight) hours as needed for nausea or vomiting.  Dispense: 60 tablet; Refill: 1  4. Essential hypertension Controlled on losartan HCT - Comprehensive metabolic panel - TSH  5. Chronic obstructive pulmonary disease, unspecified COPD type ([place])   6. Barrett's esophagus with low grade dysplasia Lifelong Protonix  7. Osteoarthritis of spine, unspecified spinal osteoarthritis complication status, unspecified spinal region   No follow-ups on file.      I, Wilhemena Durie, MD, have reviewed all documentation for this visit. The documentation on 01/16/21 for the exam, diagnosis, procedures, and orders are all accurate and complete.    Classie Weng Cranford Mon, MD  [facility] [phone] (phone) [phone] (fax)  [place]

## 2021-01-14 DIAGNOSIS — I10 Essential (primary) hypertension: Secondary | ICD-10-CM | POA: Diagnosis not present

## 2021-01-14 DIAGNOSIS — K625 Hemorrhage of anus and rectum: Secondary | ICD-10-CM | POA: Diagnosis not present

## 2021-01-15 LAB — CBC WITH DIFFERENTIAL/PLATELET
Basophils Absolute: 0 10*3/uL (ref 0.0–0.2)
Basos: 0 %
EOS (ABSOLUTE): 0.1 10*3/uL (ref 0.0–0.4)
Eos: 1 %
Hematocrit: 34.9 % (ref 34.0–46.6)
Hemoglobin: 11.9 g/dL (ref 11.1–15.9)
Immature Grans (Abs): 0 10*3/uL (ref 0.0–0.1)
Immature Granulocytes: 0 %
Lymphocytes Absolute: 1 10*3/uL (ref 0.7–3.1)
Lymphs: 20 %
MCH: 31.5 pg (ref 26.6–33.0)
MCHC: 34.1 g/dL (ref 31.5–35.7)
MCV: 92 fL (ref 79–97)
Monocytes Absolute: 0.4 10*3/uL (ref 0.1–0.9)
Monocytes: 8 %
Neutrophils Absolute: 3.8 10*3/uL (ref 1.4–7.0)
Neutrophils: 71 %
Platelets: 183 10*3/uL (ref 150–450)
RBC: 3.78 x10E6/uL (ref 3.77–5.28)
RDW: 12.9 % (ref 11.7–15.4)
WBC: 5.3 10*3/uL (ref 3.4–10.8)

## 2021-01-15 LAB — COMPREHENSIVE METABOLIC PANEL
ALT: 6 IU/L (ref 0–32)
AST: 9 IU/L (ref 0–40)
Albumin/Globulin Ratio: 2.2 (ref 1.2–2.2)
Albumin: 4.2 g/dL (ref 3.6–4.6)
Alkaline Phosphatase: 73 IU/L (ref 44–121)
BUN/Creatinine Ratio: 17 (ref 12–28)
BUN: 25 mg/dL (ref 8–27)
Bilirubin Total: 0.4 mg/dL (ref 0.0–1.2)
CO2: 25 mmol/L (ref 20–29)
Calcium: 9.4 mg/dL (ref 8.7–10.3)
Chloride: 102 mmol/L (ref 96–106)
Creatinine, Ser: 1.47 mg/dL — ABNORMAL HIGH (ref 0.57–1.00)
Globulin, Total: 1.9 g/dL (ref 1.5–4.5)
Glucose: 95 mg/dL (ref 65–99)
Potassium: 4.1 mmol/L (ref 3.5–5.2)
Sodium: 142 mmol/L (ref 134–144)
Total Protein: 6.1 g/dL (ref 6.0–8.5)
eGFR: 34 mL/min/{1.73_m2} — ABNORMAL LOW (ref >59)

## 2021-01-15 LAB — TSH: TSH: 1.24 u[IU]/mL (ref 0.450–4.500)

## 2021-02-02 ENCOUNTER — Other Ambulatory Visit: Payer: Self-pay | Admitting: General Surgery

## 2021-02-02 DIAGNOSIS — K6289 Other specified diseases of anus and rectum: Secondary | ICD-10-CM | POA: Diagnosis not present

## 2021-02-04 LAB — SURGICAL PATHOLOGY

## 2021-02-08 ENCOUNTER — Other Ambulatory Visit: Payer: Self-pay | Admitting: General Surgery

## 2021-02-08 NOTE — Progress Notes (Signed)
Subjective:     Patient ID: [name] [name] is a 87 y.o. female.   HPI   The following portions of the patient's history were reviewed and updated as appropriate.   This an established patient is here today for: office visit. The patient has been referred today by Dr. Rosanna [name] for evaluation of rectal bleeding. Patient's last colonoscopy was completed in 2015 by Dr. Vira [name]. By patient report, the colonoscopy was normal. Patient reports she first noticed the bleeding one month ago. She reports she sees the blood on the toilet paper and in her stool. Patient states it is bright red blood. She does wear a depends due to the bleeding at this time. The patient does have bowel movements every day to every other day.   The patient is accompanied today by her daughter [name] [name].       Chief Complaint  Patient presents with   Rectal Bleeding      There were no vitals taken for this visit.       Past Medical History:  Diagnosis Date   Asthma     Cataract     Cholelithiasis      with negative wall thickening or positive Murphy's sign, common bile duct 3.8mm, two hyperechoic lesions identified consistent with small hemangiomas measures just over 3cm.  1.4cm cyst noted in the liver per abdominal ultrasound 07/05/13. Left renal cyst and hydronephrosis noted.   Chronic diarrhea, unspecified     Colon polyp     COPD (chronic obstructive pulmonary disease) (CMS-HCC)     Emphysema/COPD (CMS-HCC)     GERD (gastroesophageal reflux disease)     History of bleeding ulcers     Hypertension     Osteoporosis             Past Surgical History:  Procedure Laterality Date   Adenomatous rectal polyp   04/10/2014    Dr [name] [name]   ARTHROSCOPY ANKLE FOR REPAIR FRACTURE Left     Cardiac catheterization        2 times in the past and patient reports both times coronary vessels were normal.    CATARACT EXTRACTION Bilateral     COLONOSCOPY   05/15/2006, 05/02/2001, 03/13/1996    Outside Facilities    COLONOSCOPY   10/18/2013    Adenomatous Polyp: Referral to Dr. Rochel Brome MD to remove surgically (dw)   EGD   03/13/1996    Dr. Lucio Edward   EGD   10/18/2013    Barrett's Esophagus w/low grade dysplasia: [facility] 10/2014; recall ltr mailed 08/25/2014 (dw)   EGD   09/29/2014    Barrett's Esophagus: [facility] 09/2016; Recall Ltr mailed 08/16/2016 (dw)   EXCISION LACRIMAL GLAND TUMOR       Hip fracture repair Bilateral     history of hernia repair        1960's   lumbar spine fracture       POLYPECTOMY       thyroid surgery        1950's; benign tumor removed        OB History   No obstetric history on file.     Obstetric Comments  Age at first period Age of first pregnancy             Social History           Socioeconomic History   Marital status: Widowed  Tobacco Use   Smoking status: Former Smoker      Packs/day: 1.00  Years: 20.00      Pack years: 20.00      Types: Cigarettes      Quit date: 07/11/2002      Years since quitting: 18.5   Smokeless tobacco: Former Systems developer      Quit date: 12/16/2001   Tobacco comment: Quit in 2003  Substance and Sexual Activity   Alcohol use: Yes      Alcohol/week: 3.0 standard drinks      Types: 3 Glasses of wine per week      Comment: no alcohol consumption in the last 4 months   Drug use: No             Allergies  Allergen Reactions   Ciprofloxacin Unknown   Macrobid [Nitrofurantoin Monohyd/M-Cryst] Unknown   Metoprolol Other (See Comments)      Facial swelling, pain   Sulfa (Sulfonamide Antibiotics) Nausea      GI upset      Current Medications        Current Outpatient Medications  Medication Sig Dispense Refill   ADVAIR DISKUS 250-50 mcg/dose diskus inhaler Inhale 1 inhalation into the lungs once daily.        calcium-vitamin D3-vitamin K 268-341-96 mg-unit-mcg Chew Take by mouth once daily.       HYDROcodone-acetaminophen (NORCO) 5-325 mg tablet Take 1 tablet by mouth every 6 (six) hours as needed for Pain.        LACTOBACILLUS ACIDOPHILUS (PROBIOTIC ORAL) Take by mouth once daily.       losartan-hydrochlorothiazide (HYZAAR) 100-12.5 mg tablet Take 1 tablet by mouth once daily.        omeprazole (PRILOSEC OTC) 20 MG tablet Take 20 mg by mouth 2 (two) times daily.         No current facility-administered medications for this visit.             Family History  Problem Relation Age of Onset   Pancreatic cancer Mother     Ovarian cancer Mother     High blood pressure (Hypertension) Father     Heart disease Father     Myocardial Infarction (Heart attack) Father     Heart disease Brother     Atrial fibrillation (Abnormal heart rhythm sometimes requiring treatment with blood thinners) Brother     Asthma Other          Review of Systems  Constitutional: Negative for chills and fever.  Respiratory: Negative for cough.          Objective:   Physical Exam Exam conducted with a chaperone present.  Constitutional:      Appearance: Normal appearance.  Cardiovascular:     Rate and Rhythm: Normal rate and regular rhythm.     Pulses: Normal pulses.     Heart sounds: Normal heart sounds.  Pulmonary:     Effort: Pulmonary effort is normal.     Breath sounds: Normal breath sounds.     Comments: Kyphosis.  Clear breath sounds bilaterally. Genitourinary:    Rectum: Mass (anterior rectal mass) present.     Comments: Anoscopy:  Anoscopy confirmed the digital exam suggesting a mass on the anterior rectal wall, likely in the same location as the time of her 2015 exam with Dr. Tamala [name].  There was a irregular firm mass with bleeding with touch.  Biopsies x3 with laryngeal forceps were completed.  Scant bleeding was noted after a 4 x 4 gauze tampon was removed at 10 minutes. Musculoskeletal:     Cervical back: Neck supple.  Lymphadenopathy:     Lower Body: No right inguinal adenopathy. No left inguinal adenopathy.  Skin:    General: Skin is warm and dry.  Neurological:     Mental Status: She is alert and  oriented to person, place, and time.  Psychiatric:        Mood and Affect: Mood normal.        Behavior: Behavior normal.      Labs and Radiology:    April 10, 2014 operative report by [name] Brome, MD:   INDICATIONS: This 87 year old female recently had colonoscopy with findings of a tubular adenoma of the rectum, which could not be resected with the colonoscope.  It was palpable on rectal exam, anterior and slightly to the patient's left of the midline.  There were also markedly enlarged internal hemorrhoids. The excision of the rectal polyp was recommended for definitive treatment.   Pathology:   Diagnosis:  RECTAL POLYP AND INTERNAL HEMORRHOID:  - TUBULOVILLOUS ADENOMA, NEGATIVE FOR HIGH GRADE DYSPLASIA AND  MALIGNANCY.  - SEPARATE PIECE SHOWING HEMORRHOIDS.    January 14, 2021 laboratory:     Glucose 65 - 99 mg/dL 95   BUN 8 - 27 mg/dL 25   Creatinine, Ser 0.57 - 1.00 mg/dL 1.47 High    eGFR >59 mL/min/1.73 34 Low    BUN/Creatinine Ratio 12 - 28 17   Sodium 134 - 144 mmol/L 142   Potassium 3.5 - 5.2 mmol/L 4.1   Chloride 96 - 106 mmol/L 102   CO2 20 - 29 mmol/L 25   Calcium 8.7 - 10.3 mg/dL 9.4   Total Protein 6.0 - 8.5 g/dL 6.1   Albumin 3.6 - 4.6 g/dL 4.2   Globulin, Total 1.5 - 4.5 g/dL 1.9   Albumin/Globulin Ratio 1.2 - 2.2 2.2   Bilirubin Total 0.0 - 1.2 mg/dL 0.4   Alkaline Phosphatase 44 - 121 IU/L 73   AST 0 - 40 IU/L 9   ALT 0 - 32 IU/L 6    WBC 3.4 - 10.8 x10E3/uL 5.3   RBC 3.77 - 5.28 x10E6/uL 3.78   Hemoglobin 11.1 - 15.9 g/dL 11.9   Hematocrit 34.0 - 46.6 % 34.9   MCV 79 - 97 fL 92   MCH 26.6 - 33.0 pg 31.5   MCHC 31.5 - 35.7 g/dL 34.1   RDW 11.7 - 15.4 % 12.9   Platelets 150 - 450 x10E3/uL 183   Neutrophils Not Estab. % 71   Lymphs Not Estab. % 20   Monocytes Not Estab. % 8   Eos Not Estab. % 1   Basos Not Estab. % 0   Neutrophils Absolute 1.4 - 7.0 x10E3/uL 3.8   Lymphocytes Absolute 0.7 - 3.1 x10E3/uL 1.0   Monocytes Absolute 0.1 - 0.9  x10E3/uL 0.4   EOS (ABSOLUTE) 0.0 - 0.4 x10E3/uL 0.1   Basophils Absolute 0.0 - 0.2 x10E3/uL 0.0   Immature Granulocytes Not Estab. % 0   Immature Grans (Abs) 0.0 - 0.1 x10E3/uL 0.0              Assessment:     Rectal mass, recurrent tubulovillous adenoma versus adenocarcinoma.    Plan:     The patient and her daughter will be contacted by phone when pathology results are available.  Clinical exam was very suspicious for malignancy, and this information was conveyed to the patient and her daughter.  On review of Dr. Thompson Caul notes, this may be a more benign process and the daughter was notified by  phone of the chart review result.   She will be a likely candidate for repeat excision regardless of pathology to minimize further bleeding.    This note is partially prepared by Ledell Noss, CMA acting as a scribe in the presence of Dr. Hervey Ard, MD.    The documentation recorded by the scribe accurately reflects the service I personally performed and the decisions made by me.    Robert Bellow, MD FACS   Pathology showed a tubulovillous adenoma with at a minimum intra-mucosal carcinoma.

## 2021-02-11 ENCOUNTER — Encounter
Admission: RE | Admit: 2021-02-11 | Discharge: 2021-02-11 | Disposition: A | Discharge status: Home / Self Care | Payer: Medicare Other | Source: Ambulatory Visit | Attending: General Surgery | Admitting: General Surgery

## 2021-02-11 ENCOUNTER — Other Ambulatory Visit: Payer: Self-pay

## 2021-02-11 ENCOUNTER — Other Ambulatory Visit
Admission: RE | Admit: 2021-02-11 | Discharge: 2021-02-11 | Disposition: A | Discharge status: Home / Self Care | Payer: Medicare Other | Source: Ambulatory Visit | Attending: General Surgery | Admitting: General Surgery

## 2021-02-11 DIAGNOSIS — Z01818 Encounter for other preprocedural examination: Secondary | ICD-10-CM | POA: Diagnosis not present

## 2021-02-11 NOTE — Patient Instructions (Addendum)
Your procedure is scheduled on: 02/15/21 Report to [facility]. To find out your arrival time please call [phone] between 1PM - 3PM on 02/12/21.  Remember: Instructions that are not followed completely may result in serious medical risk, up to and including death, or upon the discretion of your surgeon and anesthesiologist your surgery may need to be rescheduled.     _X__ 1. Do not eat food after midnight the night before your procedure.                 No gum chewing or hard candies.   __X__2.  On the morning of surgery brush your teeth with toothpaste and water, you                 may rinse your mouth with mouthwash if you wish.  Do not swallow any              toothpaste of mouthwash.     _X__ 3.  No Alcohol for 24 hours before or after surgery.   _X__ 4.  Do Not Smoke or use e-cigarettes For 24 Hours Prior to Your Surgery.                 Do not use any chewable tobacco products for at least 6 hours prior to                 surgery.  ____  5.  Bring all medications with you on the day of surgery if instructed.   __X__  6.  Notify your doctor if there is any change in your medical condition      (cold, fever, infections).     Do not wear jewelry, make-up, hairpins, clips or nail polish. Do not wear lotions, powders, or perfumes.  Do not shave 48 hours prior to surgery. Men may shave face and neck. Do not bring valuables to the hospital.    [facility] is not responsible for any belongings or valuables.  Contacts, dentures/partials or body piercings may not be worn into surgery. Bring a case for your contacts, glasses or hearing aids, a denture cup will be supplied. Leave your suitcase in the car. After surgery it may be brought to your room. For patients admitted to the hospital, discharge time is determined by your treatment team.   Patients discharged the day of surgery will not be allowed to drive home.   Please read  over the following fact sheets that you were given:     __X__ Take these medicines the morning of surgery with A SIP OF WATER:    1. None EXCEPT MAY TAKE  2. HYDROcodone-acetaminophen (NORCO/VICODIN) 5-325 MG tablet IF NEEDED  3.   4.  5.  6.  ____ Fleet Enema (as directed)   ____ Use CHG Soap/SAGE wipes as directed  __X__ Use inhalers on the day of surgery  ____ Stop metformin/Janumet/Farxiga 2 days prior to surgery    ____ Take 1/2 of usual insulin dose the night before surgery. No insulin the morning          of surgery.   ____ Stop Blood Thinners Coumadin/Plavix/Xarelto/Pleta/Pradaxa/Eliquis/Effient/Aspirin  on   Or contact your Surgeon, Cardiologist or Medical Doctor regarding  ability to stop your blood thinners  __X__ Stop Anti-inflammatories 7 days before surgery such as Advil, Ibuprofen, Motrin,  BC or Goodies Powder, Naprosyn, Naproxen, Aleve, Aspirin    __X__ Stop all herbal supplements, fish  oil or vitamin E until after surgery.    ____ Bring C-Pap to the hospital.

## 2021-02-14 MED ORDER — CHLORHEXIDINE GLUCONATE 0.12 % MT SOLN: 15.0000 mL | Freq: Once | OROMUCOSAL | Status: AC

## 2021-02-14 MED ORDER — LACTATED RINGERS IV SOLN: INTRAVENOUS | Status: DC

## 2021-02-14 MED ORDER — SODIUM CHLORIDE 0.9 % IV SOLN
1.0000 g | INTRAVENOUS | Status: AC
  Administered 2021-02-15: 1 g via INTRAVENOUS
  Filled 2021-02-14: qty 1

## 2021-02-14 MED ORDER — FAMOTIDINE 20 MG PO TABS: 20.0000 mg | ORAL_TABLET | Freq: Once | ORAL | Status: AC

## 2021-02-14 MED ORDER — ORAL CARE MOUTH RINSE: 15.0000 mL | Freq: Once | OROMUCOSAL | Status: AC

## 2021-02-15 ENCOUNTER — Ambulatory Visit: Payer: Medicare Other | Admitting: Certified Registered"

## 2021-02-15 ENCOUNTER — Encounter: Payer: Self-pay | Admitting: General Surgery

## 2021-02-15 ENCOUNTER — Encounter
Admission: RE | Disposition: A | Discharge status: Home / Self Care | Payer: Self-pay | Source: Home / Self Care | Attending: General Surgery

## 2021-02-15 ENCOUNTER — Ambulatory Visit
Admission: RE | Admit: 2021-02-15 | Discharge: 2021-02-15 | Disposition: A | Discharge status: Home / Self Care | Payer: Medicare Other | Attending: General Surgery | Admitting: General Surgery

## 2021-02-15 ENCOUNTER — Other Ambulatory Visit: Payer: Self-pay

## 2021-02-15 DIAGNOSIS — Z881 Allergy status to other antibiotic agents status: Secondary | ICD-10-CM | POA: Insufficient documentation

## 2021-02-15 DIAGNOSIS — Z87891 Personal history of nicotine dependence: Secondary | ICD-10-CM | POA: Insufficient documentation

## 2021-02-15 DIAGNOSIS — C20 Malignant neoplasm of rectum: Secondary | ICD-10-CM | POA: Insufficient documentation

## 2021-02-15 DIAGNOSIS — E785 Hyperlipidemia, unspecified: Secondary | ICD-10-CM | POA: Diagnosis not present

## 2021-02-15 DIAGNOSIS — Z882 Allergy status to sulfonamides status: Secondary | ICD-10-CM | POA: Insufficient documentation

## 2021-02-15 DIAGNOSIS — Z79899 Other long term (current) drug therapy: Secondary | ICD-10-CM | POA: Insufficient documentation

## 2021-02-15 DIAGNOSIS — G8929 Other chronic pain: Secondary | ICD-10-CM

## 2021-02-15 DIAGNOSIS — Z7982 Long term (current) use of aspirin: Secondary | ICD-10-CM | POA: Diagnosis not present

## 2021-02-15 DIAGNOSIS — K625 Hemorrhage of anus and rectum: Secondary | ICD-10-CM | POA: Diagnosis present

## 2021-02-15 DIAGNOSIS — Z888 Allergy status to other drugs, medicaments and biological substances status: Secondary | ICD-10-CM | POA: Insufficient documentation

## 2021-02-15 DIAGNOSIS — Z8719 Personal history of other diseases of the digestive system: Secondary | ICD-10-CM | POA: Insufficient documentation

## 2021-02-15 HISTORY — PX: MASS EXCISION: SHX2000

## 2021-02-15 SURGERY — EXCISION MASS
Anesthesia: General | Site: Anus

## 2021-02-15 MED ORDER — BUPIVACAINE LIPOSOME 1.3 % IJ SUSP
INTRAMUSCULAR | Status: AC
  Filled 2021-02-15: qty 20

## 2021-02-15 MED ORDER — CHLORHEXIDINE GLUCONATE 0.12 % MT SOLN
OROMUCOSAL | Status: AC
  Administered 2021-02-15: 15 mL via OROMUCOSAL
  Filled 2021-02-15: qty 15

## 2021-02-15 MED ORDER — MEPERIDINE HCL 25 MG/ML IJ SOLN: 6.2500 mg | INTRAMUSCULAR | Status: DC | PRN

## 2021-02-15 MED ORDER — 0.9 % SODIUM CHLORIDE (POUR BTL) OPTIME
TOPICAL | Status: DC | PRN
  Administered 2021-02-15: 500 mL

## 2021-02-15 MED ORDER — BUPIVACAINE HCL (PF) 0.25 % IJ SOLN
INTRAMUSCULAR | Status: AC
  Filled 2021-02-15: qty 30

## 2021-02-15 MED ORDER — LORAZEPAM 1 MG PO TABS: 1.0000 mg | ORAL_TABLET | Freq: Once | ORAL | Status: DC | PRN

## 2021-02-15 MED ORDER — FENTANYL CITRATE (PF) 100 MCG/2ML IJ SOLN
INTRAMUSCULAR | Status: AC
  Filled 2021-02-15: qty 2

## 2021-02-15 MED ORDER — PROPOFOL 10 MG/ML IV BOLUS
INTRAVENOUS | Status: DC | PRN
  Administered 2021-02-15: 60 mg via INTRAVENOUS

## 2021-02-15 MED ORDER — BUPIVACAINE HCL (PF) 0.5 % IJ SOLN
INTRAMUSCULAR | Status: AC
  Filled 2021-02-15: qty 30

## 2021-02-15 MED ORDER — SODIUM CHLORIDE (PF) 0.9 % IJ SOLN
INTRAMUSCULAR | Status: DC | PRN
  Administered 2021-02-15: 10 mL

## 2021-02-15 MED ORDER — PROPOFOL 10 MG/ML IV BOLUS
INTRAVENOUS | Status: AC
  Filled 2021-02-15: qty 20

## 2021-02-15 MED ORDER — DEXAMETHASONE SODIUM PHOSPHATE 10 MG/ML IJ SOLN
INTRAMUSCULAR | Status: DC | PRN
  Administered 2021-02-15: 4 mg via INTRAVENOUS

## 2021-02-15 MED ORDER — SODIUM CHLORIDE (PF) 0.9 % IJ SOLN
INTRAMUSCULAR | Status: AC
  Filled 2021-02-15: qty 50

## 2021-02-15 MED ORDER — FAMOTIDINE 20 MG PO TABS
ORAL_TABLET | ORAL | Status: AC
  Administered 2021-02-15: 20 mg via ORAL
  Filled 2021-02-15: qty 1

## 2021-02-15 MED ORDER — ACETAMINOPHEN 10 MG/ML IV SOLN
INTRAVENOUS | Status: AC
  Filled 2021-02-15: qty 100

## 2021-02-15 MED ORDER — ONDANSETRON HCL 4 MG/2ML IJ SOLN
4.0000 mg | Freq: Once | INTRAMUSCULAR | Status: AC | PRN
  Administered 2021-02-15: 4 mg via INTRAVENOUS

## 2021-02-15 MED ORDER — ACETAMINOPHEN 10 MG/ML IV SOLN
INTRAVENOUS | Status: DC | PRN
Start: 2021-02-15 — End: 2021-02-15
  Administered 2021-02-15: 750 mg via INTRAVENOUS

## 2021-02-15 MED ORDER — OXYCODONE HCL 5 MG PO TABS
5.0000 mg | ORAL_TABLET | Freq: Once | ORAL | Status: AC | PRN
  Administered 2021-02-15: 5 mg via ORAL

## 2021-02-15 MED ORDER — LIDOCAINE HCL (CARDIAC) PF 100 MG/5ML IV SOSY
PREFILLED_SYRINGE | INTRAVENOUS | Status: DC | PRN
  Administered 2021-02-15: 100 mg via INTRAVENOUS

## 2021-02-15 MED ORDER — ONDANSETRON HCL 4 MG/2ML IJ SOLN
INTRAMUSCULAR | Status: DC | PRN
  Administered 2021-02-15: 4 mg via INTRAVENOUS

## 2021-02-15 MED ORDER — PHENYLEPHRINE HCL (PRESSORS) 10 MG/ML IV SOLN
INTRAVENOUS | Status: DC | PRN
  Administered 2021-02-15: 100 ug via INTRAVENOUS

## 2021-02-15 MED ORDER — ONDANSETRON HCL 4 MG/2ML IJ SOLN
INTRAMUSCULAR | Status: AC
  Filled 2021-02-15: qty 2

## 2021-02-15 MED ORDER — FENTANYL CITRATE (PF) 100 MCG/2ML IJ SOLN
25.0000 ug | INTRAMUSCULAR | Status: DC | PRN
  Administered 2021-02-15: 25 ug via INTRAVENOUS

## 2021-02-15 MED ORDER — OXYCODONE HCL 5 MG/5ML PO SOLN: 5.0000 mg | Freq: Once | ORAL | Status: AC | PRN

## 2021-02-15 MED ORDER — OXYCODONE HCL 5 MG PO TABS
ORAL_TABLET | ORAL | Status: AC
  Filled 2021-02-15: qty 1

## 2021-02-15 MED ORDER — FENTANYL CITRATE (PF) 100 MCG/2ML IJ SOLN
INTRAMUSCULAR | Status: DC | PRN
  Administered 2021-02-15 (×2): 25 ug via INTRAVENOUS

## 2021-02-15 SURGICAL SUPPLY — 40 items
APL PRP STRL LF DISP 70% ISPRP (MISCELLANEOUS)
APPLIER CLIP 11 MED OPEN (CLIP) ×2
APR CLP MED 11 20 MLT OPN (CLIP) ×1
CANISTER SUCT 1200ML W/VALVE (MISCELLANEOUS) ×1 IMPLANT
CHLORAPREP W/TINT 26 (MISCELLANEOUS) ×1 IMPLANT
CLIP APPLIE 11 MED OPEN (CLIP) IMPLANT
DRAPE 3/4 80X56 (DRAPES) ×2 IMPLANT
DRAPE LAPAROTOMY 100X77 ABD (DRAPES) ×2 IMPLANT
DRSG TEGADERM 4X4.75 (GAUZE/BANDAGES/DRESSINGS) ×1 IMPLANT
DRSG TELFA 4X3 1S NADH ST (GAUZE/BANDAGES/DRESSINGS) ×2 IMPLANT
ELECT REM PT RETURN 9FT ADLT (ELECTROSURGICAL) ×2
ELECTRODE REM PT RTRN 9FT ADLT (ELECTROSURGICAL) ×1 IMPLANT
GAUZE 4X4 16PLY ~~LOC~~+RFID DBL (SPONGE) ×2 IMPLANT
GAUZE SPONGE 4X4 12PLY STRL (GAUZE/BANDAGES/DRESSINGS) ×2 IMPLANT
GLOVE SURG ENC MOIS LTX SZ7.5 (GLOVE) ×2 IMPLANT
GLOVE SURG UNDER LTX SZ8 (GLOVE) ×2 IMPLANT
GOWN STRL REUS W/ TWL LRG LVL3 (GOWN DISPOSABLE) ×1 IMPLANT
GOWN STRL REUS W/ TWL XL LVL3 (GOWN DISPOSABLE) ×1 IMPLANT
GOWN STRL REUS W/TWL LRG LVL3 (GOWN DISPOSABLE) ×2
GOWN STRL REUS W/TWL XL LVL3 (GOWN DISPOSABLE) ×2
KIT TURNOVER KIT A (KITS) ×2 IMPLANT
LABEL OR SOLS (LABEL) ×2 IMPLANT
MANIFOLD NEPTUNE II (INSTRUMENTS) ×2 IMPLANT
NS IRRIG 500ML POUR BTL (IV SOLUTION) ×2 IMPLANT
PACK BASIN MINOR ARMC (MISCELLANEOUS) ×2 IMPLANT
PAD PREP 24X41 OB/GYN DISP (PERSONAL CARE ITEMS) ×2 IMPLANT
SHEARS FOC LG CVD HARMONIC 17C (MISCELLANEOUS) ×1 IMPLANT
STOCKINETTE STRL 6IN 960660 (GAUZE/BANDAGES/DRESSINGS) ×2 IMPLANT
STRIP CLOSURE SKIN 1/2X4 (GAUZE/BANDAGES/DRESSINGS) ×1 IMPLANT
SUT CHROMIC 3 0 SH 27 (SUTURE) ×3 IMPLANT
SUT ETHILON 3-0 (SUTURE) ×1 IMPLANT
SUT ETHILON 4-0 (SUTURE)
SUT ETHILON 4-0 FS2 18XMFL BLK (SUTURE)
SUT VIC AB 2-0 CT1 (SUTURE) ×1 IMPLANT
SUT VIC AB 3-0 SH 27 (SUTURE)
SUT VIC AB 3-0 SH 27X BRD (SUTURE) ×1 IMPLANT
SUT VIC AB 4-0 FS2 27 (SUTURE) ×1 IMPLANT
SUT VICRYL+ 3-0 144IN (SUTURE) ×1 IMPLANT
SUTURE ETHLN 4-0 FS2 18XMF BLK (SUTURE) ×1 IMPLANT
SWABSTK COMLB BENZOIN TINCTURE (MISCELLANEOUS) ×2 IMPLANT

## 2021-02-15 NOTE — H&P (Signed)
[name] [name] [id number] [birth year]     HPI:  Previously underwent transanal excision of a villous adenoma in 2015. Recent rectal bleeding. Biopsy showed at minimum carcinoma in the mucosa.  For excision.   Medications Prior to Admission  Medication Sig Dispense Refill Last Dose   Calcium-Vitamin D-Vitamin K (VIACTIV CALCIUM PLUS D) 650-12.5-40 MG-MCG-MCG CHEW Chew 2 tablets by mouth daily.   02/14/2021   hydrochlorothiazide (HYDRODIURIL) 25 MG tablet Take 25 mg by mouth daily.   02/14/2021   HYDROcodone-acetaminophen (NORCO/VICODIN) 5-325 MG tablet TAKE 1 TABLET BY MOUTH EVERY 6 HOURS AS NEEDED FOR MODERATE PAIN (Patient taking differently: Take 1 tablet by mouth every 6 (six) hours as needed for moderate pain.) 120 tablet 0 02/14/2021   losartan (COZAAR) 100 MG tablet Take 100 mg by mouth daily.   02/14/2021   ondansetron (ZOFRAN) 4 MG tablet Take 1 tablet (4 mg total) by mouth every 8 (eight) hours as needed for nausea or vomiting. 60 tablet 1 Past Month   WIXELA INHUB 250-50 MCG/DOSE AEPB TAKE ONE PUFF TWICE DAILY. RINSE MOUTH. (Patient taking differently: Inhale 1 puff into the lungs in the morning and at bedtime.) 60 each 11 02/14/2021   Allergies  Allergen Reactions   Ciprofloxacin     Unknown   Macrobid  [Nitrofurantoin]     Unknown reaction   Metoprolol Swelling    feet swelling/pain   Sulfa Antibiotics     Upset stomach    Sulfonamide Derivatives     Upset stomach   Past Medical History:  Diagnosis Date   Asthma    Cataract    COPD (chronic obstructive pulmonary disease) (HCC)    -FeV1 74% DLCO 56%   Emphysema    HTN (hypertension)    Past Surgical History:  Procedure Laterality Date   ankle surgery with implants Left 1994   Bening Tumor Removal from Thyroid   1950's   EYE SURGERY  2014   catarac   HERNIA REPAIR  1960's   Lacrimal gland surgery  2000   lumbar spine fracture and ankle fracture     Right hip surgery with implants   1996   and LEFT Hip   status post  nasal miacalcin     TONSILLECTOMY AND ADENOIDECTOMY  1950   TOTAL HIP ARTHROPLASTY Bilateral    Social History   Socioeconomic History   Marital status: Widowed    Spouse name: Not on file   Number of children: Not on file   Years of education: Not on file   Highest education level: Not on file  Occupational History   Not on file  Tobacco Use   Smoking status: Former    Types: Cigarettes    Quit date: 07/11/2002    Years since quitting: 18.6   Smokeless tobacco: Never  Vaping Use   Vaping Use: Never used  Substance and Sexual Activity   Alcohol use: Yes    Alcohol/week: 1.0 standard drink    Types: 1 Glasses of wine per week    Comment: occasionally   Drug use: No   Sexual activity: Never  Other Topics Concern   Not on file  Social History Narrative   Not on file   Social Determinants of Health   Financial Resource Strain: Not on file  Food Insecurity: Not on file  Transportation Needs: Not on file  Physical Activity: Not on file  Stress: Not on file  Social Connections: Not on file  Intimate Partner Violence: Not on file  Social History   Social History Narrative   Not on file     ROS: Negative.     PE: HEENT: Negative. Lungs: Clear. Cardio: RR.   Assessment/Plan:  Proceed with planned trans-anal excision of mass.    Forest Gleason [facility] 02/15/2021

## 2021-02-15 NOTE — Transfer of Care (Signed)
Immediate Anesthesia Transfer of Care Note  Patient: [name] [name]  Procedure(s) Performed: TRANSANAL MASS EXCISION (Anus)  Patient Location: PACU  Anesthesia Type:General  Level of Consciousness: awake, drowsy and patient cooperative  Airway & Oxygen Therapy: Patient Spontanous Breathing and Patient connected to face mask oxygen  Post-op Assessment: Report given to RN and Post -op Vital signs reviewed and stable  Post vital signs: Reviewed and stable  Last Vitals:  Vitals Value Taken Time  BP 136/81 02/15/21 0840  Temp    Pulse 92 02/15/21 0842  Resp 14 02/15/21 0842  SpO2 100 % 02/15/21 0842  Vitals shown include unvalidated device data.  Last Pain:  Vitals:   02/15/21 0657  TempSrc: Temporal  PainSc: 0-No pain         Complications: No notable events documented.

## 2021-02-15 NOTE — Anesthesia Preprocedure Evaluation (Addendum)
Anesthesia Evaluation  Patient identified by MRN, date of birth, ID band Patient awake    Reviewed: Allergy & Precautions, NPO status , Patient's Chart, lab work & pertinent test results  Airway Mallampati: II  TM Distance: >3 FB Neck ROM: Full    Dental no notable dental hx.    Pulmonary asthma , COPD, former smoker,           Cardiovascular hypertension, Normal cardiovascular examII     Neuro/Psych    GI/Hepatic GERD  Controlled,  Endo/Other    Renal/GU      Musculoskeletal  (+) Arthritis ,   Abdominal Normal abdominal exam  (+)   Peds  Hematology   Anesthesia Other Findings   Reproductive/Obstetrics                             Anesthesia Physical Anesthesia Plan  ASA: 3  Anesthesia Plan: MAC   Post-op Pain Management:    Induction: Intravenous  PONV Risk Score and Plan: Ondansetron  Airway Management Planned: Simple Face Mask  Additional Equipment:   Intra-op Plan:   Post-operative Plan:   Informed Consent: I have reviewed the patients History and Physical, chart, labs and discussed the procedure including the risks, benefits and alternatives for the proposed anesthesia with the patient or authorized representative who has indicated his/her understanding and acceptance.       Plan Discussed with: CRNA  Anesthesia Plan Comments:         Anesthesia Quick Evaluation

## 2021-02-15 NOTE — Discharge Instructions (Signed)

## 2021-02-15 NOTE — Anesthesia Procedure Notes (Signed)
Procedure Name: LMA Insertion Date/Time: 02/15/2021 7:53 AM Performed by: Lerry Liner, CRNA Pre-anesthesia Checklist: Patient identified, Emergency Drugs available, Suction available and Patient being monitored Patient Re-evaluated:Patient Re-evaluated prior to induction Oxygen Delivery Method: Circle system utilized Preoxygenation: Pre-oxygenation with 100% oxygen Induction Type: IV induction Ventilation: Mask ventilation without difficulty LMA: LMA inserted LMA Size: 3.5 Number of attempts: 1 Tube secured with: Tape Dental Injury: Teeth and Oropharynx as per pre-operative assessment

## 2021-02-15 NOTE — Op Note (Addendum)
Preoperative diagnosis: Rectal tumor.  Postoperative diagnosis: Same.  Operative procedure: Transanal excision of rectal tumor.  Operating Surgeon: Hervey Ard, MD.  Anesthesia: General by LMA.  Estimated blood loss: Less than 5 cc.  Clinical note: This 87 year old woman had previously had a villous adenoma removed from the rectum in 2015.  In the last 6 months she has had recurrent rectal bleeding and in office examination showed evidence of a mass on the anterior rectal wall biopsy which showed at least intramucosal carcinoma.  She is brought to the operating for planned excision.  The patient had Invanz prior to the procedure.  Operative note: The patient underwent general anesthesia and tolerated this well.  She was placed gently in the dorsolithotomy position.  The perineum was cleansed with Betadine solution and draped.  The anoscope was inserted and the rectal mass was visualized on the anterior wall.  Th recto-vaginal septum was soft, without suggestion of infiltration. Under direct vision 10 cc of injectable saline was injected in the submucosal plane to elevate the mass off the underlying musculature.  The area was then excised making use of the harmonic scalpel for hemostasis.  A short suture was placed on the distal portion of the specimen, more proximal rectal mucosa and a long suture placed on the left side of the lesion.  Good hemostasis was noted.  The muscular wall appeared intact.  The mucosal defect was closed with interrupted 3-0 chromic figure-of-eight sutures.  Medium hemoclips were then placed around the site of resection in the event the postoperative radiation therapy was necessary.  Surgeon's gloves were changed.  Vaginal speculum exam showed no injury to the vaginal mucosa.   The patient was taken dorsolithotomy position and a peripad placed.  She was taken to the PACU in stable condition.

## 2021-02-16 NOTE — Anesthesia Postprocedure Evaluation (Signed)
Anesthesia Post Note  Patient: [name] [name]  Procedure(s) Performed: TRANSANAL MASS EXCISION (Anus)  Patient location during evaluation: PACU Anesthesia Type: General Level of consciousness: awake and alert Pain management: pain level controlled Vital Signs Assessment: post-procedure vital signs reviewed and stable Respiratory status: spontaneous breathing, nonlabored ventilation, respiratory function stable and patient connected to nasal cannula oxygen Cardiovascular status: stable and blood pressure returned to baseline Postop Assessment: no apparent nausea or vomiting Anesthetic complications: no   No notable events documented.   Last Vitals:  Vitals:   02/15/21 0926 02/15/21 0937  BP:  (!) 156/70  Pulse: 92 96  Resp: 20 18  Temp: 36.7 C 36.8 C  SpO2: 91% 92%    Last Pain:  Vitals:   02/15/21 0937  TempSrc: Temporal  PainSc: 5                  Jaedan Schuman Doyne Keel

## 2021-02-18 LAB — SURGICAL PATHOLOGY

## 2021-02-19 ENCOUNTER — Other Ambulatory Visit: Payer: Self-pay | Admitting: Family Medicine

## 2021-02-19 DIAGNOSIS — J438 Other emphysema: Secondary | ICD-10-CM

## 2021-03-09 ENCOUNTER — Other Ambulatory Visit: Payer: Self-pay | Admitting: Family Medicine

## 2021-03-09 DIAGNOSIS — G8929 Other chronic pain: Secondary | ICD-10-CM

## 2021-03-09 DIAGNOSIS — M545 Low back pain, unspecified: Secondary | ICD-10-CM

## 2021-03-09 NOTE — Telephone Encounter (Signed)
Requested medication (s) are due for refill today: yes  Requested medication (s) are on the active medication list: yes  Last refill:  01/13/21 #120 0 refills  Future visit scheduled: no  Notes to clinic:  not delegate per protocol     Requested Prescriptions  Pending Prescriptions Disp Refills   HYDROcodone-acetaminophen (NORCO/VICODIN) 5-325 MG tablet [Pharmacy Med Name: HYDROCODONE-APAP 5-325 MG TAB] 120 tablet     Sig: TAKE 1 TABLET BY MOUTH EVERY 6 HOURS AS NEEDED FOR MODERATE PAIN     Not Delegated - Analgesics:  Opioid Agonist Combinations Failed - 03/09/2021  9:46 AM      Failed - This refill cannot be delegated      Failed - Urine Drug Screen completed in last 360 days      Passed - Valid encounter within last 6 months    Recent Outpatient Visits           1 month ago Rectal bleeding   [facility] Jerrol Banana., MD   1 year ago Essential hypertension   [facility] Jerrol Banana., MD   1 year ago Essential hypertension   [facility] Jerrol Banana., MD   2 years ago Need for influenza vaccination   [facility] Jerrol Banana., MD   3 years ago Weight loss   [facility] Jerrol Banana., MD

## 2021-03-09 NOTE — Telephone Encounter (Signed)
LOV:  01/13/2021  NOV:  None

## 2021-05-05 ENCOUNTER — Other Ambulatory Visit: Payer: Self-pay | Admitting: Family Medicine

## 2021-05-05 DIAGNOSIS — G8929 Other chronic pain: Secondary | ICD-10-CM

## 2021-06-08 ENCOUNTER — Other Ambulatory Visit: Payer: Self-pay | Admitting: Family Medicine

## 2021-06-08 DIAGNOSIS — M545 Low back pain, unspecified: Secondary | ICD-10-CM

## 2021-06-10 DIAGNOSIS — C20 Malignant neoplasm of rectum: Secondary | ICD-10-CM | POA: Diagnosis not present

## 2021-07-07 ENCOUNTER — Telehealth: Payer: Medicare Other | Admitting: Physician Assistant

## 2021-07-07 ENCOUNTER — Ambulatory Visit: Payer: Self-pay

## 2021-07-07 DIAGNOSIS — U071 COVID-19: Secondary | ICD-10-CM

## 2021-07-07 MED ORDER — MOLNUPIRAVIR EUA 200MG CAPSULE: 4.0000 | ORAL_CAPSULE | Freq: Two times a day (BID) | ORAL | 0 refills | Status: DC

## 2021-07-07 MED ORDER — BENZONATATE 100 MG PO CAPS: 100.0000 mg | ORAL_CAPSULE | Freq: Three times a day (TID) | ORAL | 0 refills | Status: AC | PRN

## 2021-07-07 NOTE — Progress Notes (Signed)
Virtual Visit Consent   [name] [name], you are scheduled for a virtual visit with a Media provider today.     Just as with appointments in the office, your consent must be obtained to participate.  Your consent will be active for this visit and any virtual visit you may have with one of our providers in the next 365 days.     If you have a MyChart account, a copy of this consent can be sent to you electronically.  All virtual visits are billed to your insurance company just like a traditional visit in the office.    As this is a virtual visit, video technology does not allow for your provider to perform a traditional examination.  This may limit your provider's ability to fully assess your condition.  If your provider identifies any concerns that need to be evaluated in person or the need to arrange testing (such as labs, EKG, etc.), we will make arrangements to do so.     Although advances in technology are sophisticated, we cannot ensure that it will always work on either your end or our end.  If the connection with a video visit is poor, the visit may have to be switched to a telephone visit.  With either a video or telephone visit, we are not always able to ensure that we have a secure connection.     I need to obtain your verbal consent now.   Are you willing to proceed with your visit today?    [name] [name] has provided verbal consent on 07/07/2021 for a virtual visit (video or telephone).   [name], PA-C   Date: 07/07/2021 7:54 PM   Virtual Visit via Video Note   I, [name], connected with  [name] [name]  ([id number], 09/27/1929) on 07/07/21 at  7:45 PM EST by a video-enabled telemedicine application and verified that I am speaking with the correct person using two identifiers.  Location: Patient: Virtual Visit Location Patient: Home Provider: Virtual Visit Location Provider: Home Office   I discussed the limitations of evaluation and  management by telemedicine and the availability of in person appointments. The patient expressed understanding and agreed to proceed.    History of Present Illness: [name] [name] is a 87 y.o. who identifies as a female who was assigned female at birth, and is being seen today for Covid 44.  HPI: URI  This is a new problem. The current episode started in the past 7 days (Tested positive for covid 19 this afternoon; Nausea started a few days ago). The problem has been gradually worsening. There has been no fever. Associated symptoms include congestion, coughing, nausea, rhinorrhea and sinus pain. Pertinent negatives include no diarrhea, ear pain, headaches, plugged ear sensation, sore throat or vomiting. Associated symptoms comments: Post nasal drainage. Treatments tried: zofran. The treatment provided mild relief.     Problems:  Patient Active Problem List   Diagnosis Date Noted   Pulmonary emphysema ([place]) 11/16/2015   Chronic obstructive pulmonary emphysema ([place]) 12/24/2014   Allergic rhinitis 12/23/2014   Back pain, chronic 12/23/2014   Barrett's esophagus with low grade dysplasia 12/23/2014   Aneurysm, cerebral 12/23/2014   CAFL (chronic airflow limitation) ([place]) 12/23/2014   Cognitive decline 12/23/2014   Essential (primary) hypertension 12/23/2014   Fatigue 12/23/2014   Acid reflux 12/23/2014   H/O peptic ulcer 12/23/2014   History of biliary disease 12/23/2014   HLD (hyperlipidemia) 12/23/2014   Insomnia related to  another mental disorder 12/23/2014   Arthritis, degenerative 12/23/2014   OP (osteoporosis) 12/23/2014   Plantar fasciitis 12/23/2014   Hypercholesterolemia without hypertriglyceridemia 12/23/2014   Peptic ulcer 12/23/2014   Kidney cysts 12/23/2014   Fast heart beat 12/23/2014   Tubular adenoma 12/23/2014   Vertebral compression fracture ([place]) 12/23/2014   Avitaminosis D 12/23/2014   A-fib ([place]) 04/08/2014   Hypertension 08/06/2007   EMPHYSEMA 08/06/2007    ASTHMA 08/06/2007   COPD (chronic obstructive pulmonary disease) ([place]) 08/06/2007    Allergies:  Allergies  Allergen Reactions   Ciprofloxacin     Unknown   Macrobid  [Nitrofurantoin]     Unknown reaction   Metoprolol Swelling    feet swelling/pain   Sulfa Antibiotics     Upset stomach    Sulfonamide Derivatives     Upset stomach   Medications:  Current Outpatient Medications:    benzonatate (TESSALON) 100 MG capsule, Take 1 capsule (100 mg total) by mouth 3 (three) times daily as needed., Disp: 30 capsule, Rfl: 0   molnupiravir EUA (LAGEVRIO) 200 mg CAPS capsule, Take 4 capsules (800 mg total) by mouth 2 (two) times daily for 5 days., Disp: 40 capsule, Rfl: 0   Calcium-Vitamin D-Vitamin K (VIACTIV CALCIUM PLUS D) 650-12.5-40 MG-MCG-MCG CHEW, Chew 2 tablets by mouth daily., Disp: , Rfl:    fluticasone-salmeterol (ADVAIR) 250-50 MCG/ACT AEPB, TAKE ONE PUFF TWICE DAILY. RINSE MOUTH., Disp: 60 each, Rfl: 5   hydrochlorothiazide (HYDRODIURIL) 25 MG tablet, TAKE 1 TABLET BY MOUTH DAILY USUALLY IN THE MORNING. TAKE WITH LOSARTAN., Disp: 90 tablet, Rfl: 3   HYDROcodone-acetaminophen (NORCO/VICODIN) 5-325 MG tablet, TAKE 1 TABLET BY MOUTH EVERY 6 HOURS AS NEEDED FOR MODERATE PAIN, Disp: 120 tablet, Rfl: 0   losartan (COZAAR) 100 MG tablet, TAKE 1 TABLET BY MOUTH DAILY USUALLY IN THE MORNING. TAKE WITH HCTZ., Disp: 90 tablet, Rfl: 3   ondansetron (ZOFRAN) 4 MG tablet, Take 1 tablet (4 mg total) by mouth every 8 (eight) hours as needed for nausea or vomiting., Disp: 60 tablet, Rfl: 1  Observations/Objective: Patient is well-developed, well-nourished in no acute distress.  Resting comfortably at home.  Head is normocephalic, atraumatic.  No labored breathing.  Speech is clear and coherent with logical content.  Patient is alert and oriented at baseline.    Assessment and Plan: 1. COVID-19 - molnupiravir EUA (LAGEVRIO) 200 mg CAPS capsule; Take 4 capsules (800 mg total) by mouth 2 (two)  times daily for 5 days.  Dispense: 40 capsule; Refill: 0 - benzonatate (TESSALON) 100 MG capsule; Take 1 capsule (100 mg total) by mouth 3 (three) times daily as needed.  Dispense: 30 capsule; Refill: 0  - Continue OTC symptomatic management of choice - Will send OTC vitamins and supplement information through AVS - Molnupiravir and tessalon perles prescribed - Patient enrolled in MyChart symptom monitoring - Push fluids - Rest as needed - Discussed return precautions and when to seek in-person evaluation, sent via AVS as well   Follow Up Instructions: I discussed the assessment and treatment plan with the patient. The patient was provided an opportunity to ask questions and all were answered. The patient agreed with the plan and demonstrated an understanding of the instructions.  A copy of instructions were sent to the patient via MyChart unless otherwise noted below.    The patient was advised to call back or seek an in-person evaluation if the symptoms worsen or if the condition fails to improve as anticipated.  Time:  I spent  11 minutes with the patient via telehealth technology discussing the above problems/concerns.    [name], PA-C

## 2021-07-07 NOTE — Patient Instructions (Signed)
[name] Sledge Laguardia, thank you for joining [name], PA-C for today's virtual visit.  While this provider is not your primary care provider (PCP), if your PCP is located in our provider database this encounter information will be shared with them immediately following your visit.  Consent: (Patient) [name] [name] provided verbal consent for this virtual visit at the beginning of the encounter.  Current Medications:  Current Outpatient Medications:    benzonatate (TESSALON) 100 MG capsule, Take 1 capsule (100 mg total) by mouth 3 (three) times daily as needed., Disp: 30 capsule, Rfl: 0   molnupiravir EUA (LAGEVRIO) 200 mg CAPS capsule, Take 4 capsules (800 mg total) by mouth 2 (two) times daily for 5 days., Disp: 40 capsule, Rfl: 0   Calcium-Vitamin D-Vitamin K (VIACTIV CALCIUM PLUS D) 650-12.5-40 MG-MCG-MCG CHEW, Chew 2 tablets by mouth daily., Disp: , Rfl:    fluticasone-salmeterol (ADVAIR) 250-50 MCG/ACT AEPB, TAKE ONE PUFF TWICE DAILY. RINSE MOUTH., Disp: 60 each, Rfl: 5   hydrochlorothiazide (HYDRODIURIL) 25 MG tablet, TAKE 1 TABLET BY MOUTH DAILY USUALLY IN THE MORNING. TAKE WITH LOSARTAN., Disp: 90 tablet, Rfl: 3   HYDROcodone-acetaminophen (NORCO/VICODIN) 5-325 MG tablet, TAKE 1 TABLET BY MOUTH EVERY 6 HOURS AS NEEDED FOR MODERATE PAIN, Disp: 120 tablet, Rfl: 0   losartan (COZAAR) 100 MG tablet, TAKE 1 TABLET BY MOUTH DAILY USUALLY IN THE MORNING. TAKE WITH HCTZ., Disp: 90 tablet, Rfl: 3   ondansetron (ZOFRAN) 4 MG tablet, Take 1 tablet (4 mg total) by mouth every 8 (eight) hours as needed for nausea or vomiting., Disp: 60 tablet, Rfl: 1   Medications ordered in this encounter:  Meds ordered this encounter  Medications   molnupiravir EUA (LAGEVRIO) 200 mg CAPS capsule    Sig: Take 4 capsules (800 mg total) by mouth 2 (two) times daily for 5 days.    Dispense:  40 capsule    Refill:  0    Order Specific Question:   Supervising Provider    Answer:   MILLER, BRIAN [[id number]]    benzonatate (TESSALON) 100 MG capsule    Sig: Take 1 capsule (100 mg total) by mouth 3 (three) times daily as needed.    Dispense:  30 capsule    Refill:  0    Order Specific Question:   Supervising Provider    Answer:   Sabra Heck, [place]     *If you need refills on other medications prior to your next appointment, please contact your pharmacy*  Follow-Up: Call back or seek an in-person evaluation if the symptoms worsen or if the condition fails to improve as anticipated.  Other Instructions Molnupiravir Oral Capsules What is this medication? MOLNUPIRAVIR (mol nue pir a vir) treats COVID-19. It is an antiviral medication. It may decrease the risk of developing severe symptoms of COVID-19. It may also decrease the chance of going to the hospital. This medication is not approved by the FDA. The FDA has authorized emergency use of this medication during the COVID-19 pandemic. This medicine may be used for other purposes; ask your health care provider or pharmacist if you have questions. COMMON BRAND NAME(S): LAGEVRIO What should I tell my care team before I take this medication? They need to know if you have any of these conditions: Any allergies Any serious illness An unusual or allergic reaction to molnupiravir, other medications, foods, dyes, or preservatives Pregnant or trying to get pregnant Breast-feeding How should I use this medication? Take this medication by mouth with water. Take  it as directed on the prescription label at the same time every day. Do not cut, crush or chew this medication. Swallow the capsules whole. You can take it with or without food. If it upsets your stomach, take it with food. Take all of this medication unless your care team tells you to stop it early. Keep taking it even if you think you are better. Talk to your care team about the use of this medication in children. Special care may be needed. Overdosage: If you think you have taken too much of  this medicine contact a poison control center or emergency room at once. NOTE: This medicine is only for you. Do not share this medicine with others. What if I miss a dose? If you miss a dose, take it as soon as you can unless it is more than 10 hours late. If it is more than 10 hours late, skip the missed dose. Take the next dose at the normal time. Do not take extra or 2 doses at the same time to make up for the missed dose. What may interact with this medication? Interactions have not been studied. This list may not describe all possible interactions. Give your health care provider a list of all the medicines, herbs, non-prescription drugs, or dietary supplements you use. Also tell them if you smoke, drink alcohol, or use illegal drugs. Some items may interact with your medicine. What should I watch for while using this medication? Your condition will be monitored carefully while you are receiving this medication. Visit your care team for regular checkups. Tell your care team if your symptoms do not start to get better or if they get worse. Do not become pregnant while taking this medication. You may need a pregnancy test before starting this medication. Women must use a reliable form of birth control while taking this medication and for 4 days after stopping the medication. Women should inform their care team if they wish to become pregnant or think they might be pregnant. Men should not father a child while taking this medication and for 3 months after stopping it. There is potential for serious harm to an unborn child. Talk to your care team for more information. Do not breast-feed an infant while taking this medication and for 4 days after stopping the medication. What side effects may I notice from receiving this medication? Side effects that you should report to your care team as soon as possible: Allergic reactions--skin rash, itching, hives, swelling of the face, lips, tongue, or throat Side  effects that usually do not require medical attention (report these to your care team if they continue or are bothersome): Diarrhea Dizziness Nausea This list may not describe all possible side effects. Call your doctor for medical advice about side effects. You may report side effects to FDA at 1-800-FDA-1088. Where should I keep my medication? Keep out of the reach of children and pets. Store at room temperature between 20 and 25 degrees C (68 and 77 degrees F). Get rid of any unused medication after the expiration date. To get rid of medications that are no longer needed or have expired: Take the medication to a medication take-back program. Check with your pharmacy or law enforcement to find a location. If you cannot return the medication, check the label or package insert to see if the medication should be thrown out in the garbage or flushed down the toilet. If you are not sure, ask your care team. If it is  safe to put it in the trash, take the medication out of the container. Mix the medication with cat litter, dirt, coffee grounds, or other unwanted substance. Seal the mixture in a bag or container. Put it in the trash. NOTE: This sheet is a summary. It may not cover all possible information. If you have questions about this medicine, talk to your doctor, pharmacist, or health care provider.  2022 Elsevier/Gold Standard (2020-07-06 00:00:00)   10 Things You Can Do to Manage Your COVID-19 Symptoms at Home If you have possible or confirmed COVID-19 Stay home except to get medical care. Monitor your symptoms carefully. If your symptoms get worse, call your healthcare provider immediately. Get rest and stay hydrated. If you have a medical appointment, call the healthcare provider ahead of time and tell them that you have or may have COVID-19. For medical emergencies, call 911 and notify the dispatch personnel that you have or may have COVID-19. Cover your cough and sneezes with a tissue or  use the inside of your elbow. Wash your hands often with soap and water for at least 20 seconds or clean your hands with an alcohol-based hand sanitizer that contains at least 60% alcohol. As much as possible, stay in a specific room and away from other people in your home. Also, you should use a separate bathroom, if available. If you need to be around other people in or outside of the home, wear a mask. Avoid sharing personal items with other people in your household, like dishes, towels, and bedding. Clean all surfaces that are touched often, like counters, tabletops, and doorknobs. Use household cleaning sprays or wipes according to the label instructions. [email or web address] 01/24/2020 This information is not intended to replace advice given to you by your health care provider. Make sure you discuss any questions you have with your health care provider. Document Revised: 03/19/2021 Document Reviewed: 03/19/2021 Elsevier Patient Education  2022 [employer].    If you have been instructed to have an in-person evaluation today at a local Urgent Care facility, please use the link below. It will take you to a list of all of our available [facility], including address, phone number and hours of operation. Please do not delay care.  [facility]  If you or a family member do not have a primary care provider, use the link below to schedule a visit and establish care. When you choose a [facility] primary care physician or advanced practice provider, you gain a long-term partner in health. Find a Primary Care Provider  Learn more about [facility]'s in-office and virtual care options: [facility]

## 2021-07-07 NOTE — Telephone Encounter (Signed)
°  Chief Complaint: COVID Symptoms: upset stomach, cough, congestion Frequency: 1 day Pertinent Negatives: Patient denies fever Disposition: [] ED /[] Urgent Care (no appt availability in office) / [] Appointment(In office/virtual)/ [x]  [facility]/ [] Home Care/ [] Refused Recommended Disposition  Additional Notes: Pt's daughter called, is asking about antiviral medication to begin so pt can get better due to age. Advised she would need visit and scheduled pt for virtual UC visit this evening at [facility]. Advised daughter if any issues to call back before 1900 to speak with someone to get assistance with Mychart. She verbalized understanding.    Summary: Congested, upset stomach , diarrhea, nausea, coughing phlegm   Patients daughter called in , patient tested positive today, Congested, upset stomach , diarrhea, nausea, coughing phlegm. She is asking for paxlovid to be prescribed. [place], [place] - [place]  Phone: [phone]  Fax: [phone]      Reason for Disposition  [1] HIGH RISK for severe COVID complications (e.g., weak immune system, age > 51 years, obesity with BMI > 21, pregnant, chronic lung disease or other chronic medical condition) AND [2] COVID symptoms (e.g., cough, fever)  (Exceptions: Already seen by PCP and no new or worsening symptoms.)  Answer Assessment - Initial Assessment Questions 1. COVID-19 DIAGNOSIS: "Who made your COVID-19 diagnosis?" "Was it confirmed by a positive lab test or self-test?" If not diagnosed by a doctor (or NP/PA), ask "Are there lots of cases (community spread) where you live?" Note: See public health department website, if unsure.     Home test today 3. ONSET: "When did the COVID-19 symptoms start?"      Yesterday morning 4. WORST SYMPTOM: "What is your worst symptom?" (e.g., cough, fever, shortness of breath, muscle aches)     Upset stomach 5. COUGH: "Do you have a cough?" If Yes, ask: "How bad is the cough?"        yes 6. FEVER: "Do you have a fever?" If Yes, ask: "What is your temperature, how was it measured, and when did it start?"     Low grade 7. RESPIRATORY STATUS: "Describe your breathing?" (e.g., shortness of breath, wheezing, unable to speak)      No 10. VACCINE: "Have you had the COVID-19 vaccine?" If Yes, ask: "Which one, how many shots, when did you get it?"       yes 11. BOOSTER: "Have you received your COVID-19 booster?" If Yes, ask: "Which one and when did you get it?"       No  13. OTHER SYMPTOMS: "Do you have any other symptoms?"  (e.g., chills, fatigue, headache, loss of smell or taste, muscle pain, sore throat)       Upset stomach, congested  Protocols used: Coronavirus (COVID-19) Diagnosed or Suspected-A-AH

## 2021-07-11 ENCOUNTER — Inpatient Hospital Stay
Admission: EM | Admit: 2021-07-11 | Discharge: 2021-08-11 | DRG: 177 | Disposition: E | Payer: Medicare Other | Attending: Student | Admitting: Student

## 2021-07-11 ENCOUNTER — Other Ambulatory Visit: Payer: Self-pay

## 2021-07-11 ENCOUNTER — Emergency Department: Payer: Medicare Other

## 2021-07-11 ENCOUNTER — Encounter: Payer: Self-pay | Admitting: Emergency Medicine

## 2021-07-11 DIAGNOSIS — R0602 Shortness of breath: Secondary | ICD-10-CM

## 2021-07-11 DIAGNOSIS — I129 Hypertensive chronic kidney disease with stage 1 through stage 4 chronic kidney disease, or unspecified chronic kidney disease: Secondary | ICD-10-CM | POA: Diagnosis present

## 2021-07-11 DIAGNOSIS — I480 Paroxysmal atrial fibrillation: Secondary | ICD-10-CM | POA: Diagnosis present

## 2021-07-11 DIAGNOSIS — Z681 Body mass index (BMI) 19 or less, adult: Secondary | ICD-10-CM

## 2021-07-11 DIAGNOSIS — C801 Malignant (primary) neoplasm, unspecified: Secondary | ICD-10-CM

## 2021-07-11 DIAGNOSIS — E878 Other disorders of electrolyte and fluid balance, not elsewhere classified: Secondary | ICD-10-CM | POA: Diagnosis present

## 2021-07-11 DIAGNOSIS — R509 Fever, unspecified: Secondary | ICD-10-CM | POA: Diagnosis not present

## 2021-07-11 DIAGNOSIS — J9601 Acute respiratory failure with hypoxia: Secondary | ICD-10-CM | POA: Diagnosis present

## 2021-07-11 DIAGNOSIS — Z96643 Presence of artificial hip joint, bilateral: Secondary | ICD-10-CM | POA: Diagnosis present

## 2021-07-11 DIAGNOSIS — I700 Atherosclerosis of aorta: Secondary | ICD-10-CM | POA: Diagnosis present

## 2021-07-11 DIAGNOSIS — I959 Hypotension, unspecified: Secondary | ICD-10-CM | POA: Diagnosis not present

## 2021-07-11 DIAGNOSIS — J810 Acute pulmonary edema: Secondary | ICD-10-CM | POA: Diagnosis not present

## 2021-07-11 DIAGNOSIS — R410 Disorientation, unspecified: Secondary | ICD-10-CM | POA: Diagnosis not present

## 2021-07-11 DIAGNOSIS — E871 Hypo-osmolality and hyponatremia: Secondary | ICD-10-CM | POA: Diagnosis present

## 2021-07-11 DIAGNOSIS — R0902 Hypoxemia: Secondary | ICD-10-CM | POA: Diagnosis not present

## 2021-07-11 DIAGNOSIS — R0689 Other abnormalities of breathing: Secondary | ICD-10-CM | POA: Diagnosis not present

## 2021-07-11 DIAGNOSIS — J9621 Acute and chronic respiratory failure with hypoxia: Secondary | ICD-10-CM | POA: Diagnosis present

## 2021-07-11 DIAGNOSIS — Z66 Do not resuscitate: Secondary | ICD-10-CM | POA: Diagnosis present

## 2021-07-11 DIAGNOSIS — I471 Supraventricular tachycardia: Secondary | ICD-10-CM | POA: Diagnosis not present

## 2021-07-11 DIAGNOSIS — R918 Other nonspecific abnormal finding of lung field: Secondary | ICD-10-CM | POA: Diagnosis not present

## 2021-07-11 DIAGNOSIS — R0600 Dyspnea, unspecified: Secondary | ICD-10-CM | POA: Diagnosis not present

## 2021-07-11 DIAGNOSIS — R0603 Acute respiratory distress: Secondary | ICD-10-CM | POA: Diagnosis not present

## 2021-07-11 DIAGNOSIS — I248 Other forms of acute ischemic heart disease: Secondary | ICD-10-CM | POA: Diagnosis present

## 2021-07-11 DIAGNOSIS — F321 Major depressive disorder, single episode, moderate: Secondary | ICD-10-CM | POA: Diagnosis present

## 2021-07-11 DIAGNOSIS — N179 Acute kidney failure, unspecified: Secondary | ICD-10-CM | POA: Diagnosis not present

## 2021-07-11 DIAGNOSIS — E870 Hyperosmolality and hypernatremia: Secondary | ICD-10-CM | POA: Diagnosis not present

## 2021-07-11 DIAGNOSIS — C3412 Malignant neoplasm of upper lobe, left bronchus or lung: Secondary | ICD-10-CM | POA: Diagnosis present

## 2021-07-11 DIAGNOSIS — J189 Pneumonia, unspecified organism: Secondary | ICD-10-CM | POA: Diagnosis present

## 2021-07-11 DIAGNOSIS — E538 Deficiency of other specified B group vitamins: Secondary | ICD-10-CM | POA: Diagnosis present

## 2021-07-11 DIAGNOSIS — E43 Unspecified severe protein-calorie malnutrition: Secondary | ICD-10-CM | POA: Diagnosis present

## 2021-07-11 DIAGNOSIS — J441 Chronic obstructive pulmonary disease with (acute) exacerbation: Secondary | ICD-10-CM | POA: Diagnosis not present

## 2021-07-11 DIAGNOSIS — E876 Hypokalemia: Secondary | ICD-10-CM | POA: Diagnosis not present

## 2021-07-11 DIAGNOSIS — Z743 Need for continuous supervision: Secondary | ICD-10-CM | POA: Diagnosis not present

## 2021-07-11 DIAGNOSIS — R079 Chest pain, unspecified: Secondary | ICD-10-CM | POA: Diagnosis not present

## 2021-07-11 DIAGNOSIS — R0989 Other specified symptoms and signs involving the circulatory and respiratory systems: Secondary | ICD-10-CM

## 2021-07-11 DIAGNOSIS — Z8249 Family history of ischemic heart disease and other diseases of the circulatory system: Secondary | ICD-10-CM

## 2021-07-11 DIAGNOSIS — U071 COVID-19: Principal | ICD-10-CM | POA: Diagnosis present

## 2021-07-11 DIAGNOSIS — Z881 Allergy status to other antibiotic agents status: Secondary | ICD-10-CM

## 2021-07-11 DIAGNOSIS — M810 Age-related osteoporosis without current pathological fracture: Secondary | ICD-10-CM | POA: Diagnosis present

## 2021-07-11 DIAGNOSIS — J1282 Pneumonia due to coronavirus disease 2019: Secondary | ICD-10-CM | POA: Diagnosis present

## 2021-07-11 DIAGNOSIS — Z882 Allergy status to sulfonamides status: Secondary | ICD-10-CM

## 2021-07-11 DIAGNOSIS — G9341 Metabolic encephalopathy: Secondary | ICD-10-CM | POA: Diagnosis not present

## 2021-07-11 DIAGNOSIS — E860 Dehydration: Secondary | ICD-10-CM | POA: Diagnosis present

## 2021-07-11 DIAGNOSIS — Z515 Encounter for palliative care: Secondary | ICD-10-CM

## 2021-07-11 DIAGNOSIS — J439 Emphysema, unspecified: Secondary | ICD-10-CM | POA: Diagnosis present

## 2021-07-11 DIAGNOSIS — Z7189 Other specified counseling: Secondary | ICD-10-CM | POA: Diagnosis not present

## 2021-07-11 DIAGNOSIS — Z87891 Personal history of nicotine dependence: Secondary | ICD-10-CM

## 2021-07-11 DIAGNOSIS — N19 Unspecified kidney failure: Secondary | ICD-10-CM | POA: Diagnosis not present

## 2021-07-11 DIAGNOSIS — R339 Retention of urine, unspecified: Secondary | ICD-10-CM | POA: Diagnosis not present

## 2021-07-11 DIAGNOSIS — R627 Adult failure to thrive: Secondary | ICD-10-CM | POA: Diagnosis present

## 2021-07-11 DIAGNOSIS — D696 Thrombocytopenia, unspecified: Secondary | ICD-10-CM | POA: Diagnosis present

## 2021-07-11 DIAGNOSIS — R7989 Other specified abnormal findings of blood chemistry: Secondary | ICD-10-CM

## 2021-07-11 DIAGNOSIS — I4891 Unspecified atrial fibrillation: Secondary | ICD-10-CM | POA: Diagnosis not present

## 2021-07-11 DIAGNOSIS — J9811 Atelectasis: Secondary | ICD-10-CM | POA: Diagnosis not present

## 2021-07-11 DIAGNOSIS — N182 Chronic kidney disease, stage 2 (mild): Secondary | ICD-10-CM | POA: Diagnosis present

## 2021-07-11 DIAGNOSIS — J90 Pleural effusion, not elsewhere classified: Secondary | ICD-10-CM | POA: Diagnosis not present

## 2021-07-11 DIAGNOSIS — Q046 Congenital cerebral cysts: Secondary | ICD-10-CM | POA: Diagnosis not present

## 2021-07-11 DIAGNOSIS — D6959 Other secondary thrombocytopenia: Secondary | ICD-10-CM | POA: Diagnosis present

## 2021-07-11 DIAGNOSIS — Z888 Allergy status to other drugs, medicaments and biological substances status: Secondary | ICD-10-CM

## 2021-07-11 DIAGNOSIS — I10 Essential (primary) hypertension: Secondary | ICD-10-CM | POA: Diagnosis not present

## 2021-07-11 HISTORY — DX: Paroxysmal atrial fibrillation: I48.0

## 2021-07-11 HISTORY — DX: Chest pain, unspecified: R07.9

## 2021-07-11 HISTORY — DX: Other ill-defined heart diseases: I51.89

## 2021-07-11 LAB — TROPONIN I (HIGH SENSITIVITY)
Troponin I (High Sensitivity): 16 ng/L (ref <18)
Troponin I (High Sensitivity): 18 ng/L — ABNORMAL HIGH (ref <18)
Troponin I (High Sensitivity): 19 ng/L — ABNORMAL HIGH (ref <18)

## 2021-07-11 LAB — BASIC METABOLIC PANEL
Anion gap: 9 (ref 5–15)
BUN: 19 mg/dL (ref 8–23)
CO2: 28 mmol/L (ref 22–32)
Calcium: 8.5 mg/dL — ABNORMAL LOW (ref 8.9–10.3)
Chloride: 93 mmol/L — ABNORMAL LOW (ref 98–111)
Creatinine, Ser: 1.1 mg/dL — ABNORMAL HIGH (ref 0.44–1.00)
GFR, Estimated: 48 mL/min — ABNORMAL LOW (ref >60)
Glucose, Bld: 122 mg/dL — ABNORMAL HIGH (ref 70–99)
Potassium: 2.8 mmol/L — ABNORMAL LOW (ref 3.5–5.1)
Sodium: 130 mmol/L — ABNORMAL LOW (ref 135–145)

## 2021-07-11 LAB — BLOOD GAS, VENOUS
Acid-Base Excess: 5.1 mmol/L — ABNORMAL HIGH (ref 0.0–2.0)
Bicarbonate: 31 mmol/L — ABNORMAL HIGH (ref 20.0–28.0)
O2 Saturation: 45.3 %
Patient temperature: 37
pCO2, Ven: 50 mmHg (ref 44.0–60.0)
pH, Ven: 7.4 (ref 7.250–7.430)

## 2021-07-11 LAB — CBC WITH DIFFERENTIAL/PLATELET
Abs Immature Granulocytes: 0.01 10*3/uL (ref 0.00–0.07)
Basophils Absolute: 0 10*3/uL (ref 0.0–0.1)
Basophils Relative: 1 %
Eosinophils Absolute: 0 10*3/uL (ref 0.0–0.5)
Eosinophils Relative: 1 %
HCT: 36.4 % (ref 36.0–46.0)
Hemoglobin: 12.2 g/dL (ref 12.0–15.0)
Immature Granulocytes: 1 %
Lymphocytes Relative: 21 %
Lymphs Abs: 0.4 10*3/uL — ABNORMAL LOW (ref 0.7–4.0)
MCH: 31.3 pg (ref 26.0–34.0)
MCHC: 33.5 g/dL (ref 30.0–36.0)
MCV: 93.3 fL (ref 80.0–100.0)
Monocytes Absolute: 0.2 10*3/uL (ref 0.1–1.0)
Monocytes Relative: 8 %
Neutro Abs: 1.5 10*3/uL — ABNORMAL LOW (ref 1.7–7.7)
Neutrophils Relative %: 68 %
Platelets: 110 10*3/uL — ABNORMAL LOW (ref 150–400)
RBC: 3.9 MIL/uL (ref 3.87–5.11)
RDW: 12.8 % (ref 11.5–15.5)
Smear Review: NORMAL
WBC: 2.1 10*3/uL — ABNORMAL LOW (ref 4.0–10.5)
nRBC: 0 % (ref 0.0–0.2)

## 2021-07-11 LAB — LACTATE DEHYDROGENASE: LDH: 142 U/L (ref 98–192)

## 2021-07-11 LAB — RESP PANEL BY RT-PCR (FLU A&B, COVID) ARPGX2
Influenza A by PCR: NEGATIVE
Influenza B by PCR: NEGATIVE
SARS Coronavirus 2 by RT PCR: POSITIVE — AB

## 2021-07-11 LAB — PROCALCITONIN: Procalcitonin: 0.38 ng/mL

## 2021-07-11 LAB — MAGNESIUM: Magnesium: 1.3 mg/dL — ABNORMAL LOW (ref 1.7–2.4)

## 2021-07-11 LAB — FIBRINOGEN: Fibrinogen: 413 mg/dL (ref 210–475)

## 2021-07-11 LAB — D-DIMER, QUANTITATIVE: D-Dimer, Quant: 1.58 ug/mL-FEU — ABNORMAL HIGH (ref 0.00–0.50)

## 2021-07-11 LAB — FERRITIN: Ferritin: 137 ng/mL (ref 11–307)

## 2021-07-11 LAB — BRAIN NATRIURETIC PEPTIDE: B Natriuretic Peptide: 62.3 pg/mL (ref 0.0–100.0)

## 2021-07-11 LAB — LACTIC ACID, PLASMA: Lactic Acid, Venous: 1.7 mmol/L (ref 0.5–1.9)

## 2021-07-11 MED ORDER — BENZONATATE 100 MG PO CAPS
100.0000 mg | ORAL_CAPSULE | Freq: Three times a day (TID) | ORAL | Status: DC | PRN
  Filled 2021-07-11: qty 1

## 2021-07-11 MED ORDER — BARICITINIB 2 MG PO TABS
2.0000 mg | ORAL_TABLET | Freq: Every day | ORAL | Status: DC
  Administered 2021-07-12 (×2): 2 mg via ORAL
  Filled 2021-07-11 (×3): qty 1

## 2021-07-11 MED ORDER — LOSARTAN POTASSIUM 50 MG PO TABS
100.0000 mg | ORAL_TABLET | Freq: Every day | ORAL | Status: DC
  Administered 2021-07-12 – 2021-07-14 (×3): 100 mg via ORAL
  Filled 2021-07-11 (×3): qty 2

## 2021-07-11 MED ORDER — GUAIFENESIN-DM 100-10 MG/5ML PO SYRP
10.0000 mL | ORAL_SOLUTION | ORAL | Status: DC | PRN
  Administered 2021-07-18: 10 mL via ORAL
  Filled 2021-07-11: qty 10

## 2021-07-11 MED ORDER — ONDANSETRON HCL 4 MG PO TABS
4.0000 mg | ORAL_TABLET | Freq: Four times a day (QID) | ORAL | Status: DC | PRN
  Administered 2021-07-16: 4 mg via ORAL
  Filled 2021-07-11: qty 1

## 2021-07-11 MED ORDER — SODIUM CHLORIDE 0.9 % IV SOLN
1.0000 g | Freq: Once | INTRAVENOUS | Status: AC
  Administered 2021-07-11: 1 g via INTRAVENOUS
  Filled 2021-07-11: qty 10

## 2021-07-11 MED ORDER — GUAIFENESIN ER 600 MG PO TB12
600.0000 mg | ORAL_TABLET | Freq: Two times a day (BID) | ORAL | Status: DC
  Administered 2021-07-11 – 2021-07-21 (×18): 600 mg via ORAL
  Filled 2021-07-11 (×20): qty 1

## 2021-07-11 MED ORDER — SODIUM CHLORIDE 0.9 % IV SOLN
500.0000 mg | Freq: Once | INTRAVENOUS | Status: AC
  Administered 2021-07-11: 500 mg via INTRAVENOUS
  Filled 2021-07-11: qty 5

## 2021-07-11 MED ORDER — VITAMIN D 25 MCG (1000 UNIT) PO TABS
1000.0000 [IU] | ORAL_TABLET | Freq: Every day | ORAL | Status: DC
  Administered 2021-07-11 – 2021-07-28 (×15): 1000 [IU] via ORAL
  Filled 2021-07-11 (×17): qty 1

## 2021-07-11 MED ORDER — HYDROCOD POLI-CHLORPHE POLI ER 10-8 MG/5ML PO SUER
5.0000 mL | Freq: Two times a day (BID) | ORAL | Status: DC | PRN
  Administered 2021-07-15 – 2021-07-24 (×2): 5 mL via ORAL
  Filled 2021-07-11 (×2): qty 5

## 2021-07-11 MED ORDER — SODIUM CHLORIDE 0.9 % IV SOLN
200.0000 mg | Freq: Once | INTRAVENOUS | Status: AC
  Administered 2021-07-12: 200 mg via INTRAVENOUS
  Filled 2021-07-11: qty 200

## 2021-07-11 MED ORDER — METHYLPREDNISOLONE SODIUM SUCC 125 MG IJ SOLR
1.0000 mg/kg | Freq: Two times a day (BID) | INTRAMUSCULAR | Status: AC
  Administered 2021-07-11 – 2021-07-14 (×6): 45 mg via INTRAVENOUS
  Filled 2021-07-11 (×7): qty 2

## 2021-07-11 MED ORDER — OYSTER SHELL CALCIUM/D3 500-5 MG-MCG PO TABS
2.0000 | ORAL_TABLET | Freq: Every day | ORAL | Status: DC
  Administered 2021-07-12 – 2021-07-28 (×13): 2 via ORAL
  Filled 2021-07-11 (×16): qty 2

## 2021-07-11 MED ORDER — POTASSIUM CHLORIDE 20 MEQ PO PACK
40.0000 meq | PACK | Freq: Once | ORAL | Status: AC
  Administered 2021-07-12: 40 meq via ORAL
  Filled 2021-07-11: qty 2

## 2021-07-11 MED ORDER — ACETAMINOPHEN 325 MG PO TABS
650.0000 mg | ORAL_TABLET | Freq: Four times a day (QID) | ORAL | Status: DC | PRN
  Administered 2021-07-21: 650 mg via ORAL
  Filled 2021-07-11: qty 2

## 2021-07-11 MED ORDER — ZINC SULFATE 220 (50 ZN) MG PO CAPS
220.0000 mg | ORAL_CAPSULE | Freq: Every day | ORAL | Status: DC
  Administered 2021-07-11 – 2021-07-28 (×13): 220 mg via ORAL
  Filled 2021-07-11 (×17): qty 1

## 2021-07-11 MED ORDER — PREDNISONE 20 MG PO TABS
50.0000 mg | ORAL_TABLET | Freq: Every day | ORAL | Status: DC
  Administered 2021-07-14 – 2021-07-15 (×2): 50 mg via ORAL
  Filled 2021-07-11 (×2): qty 3

## 2021-07-11 MED ORDER — ENOXAPARIN SODIUM 40 MG/0.4ML IJ SOSY: 40.0000 mg | PREFILLED_SYRINGE | INTRAMUSCULAR | Status: DC

## 2021-07-11 MED ORDER — MAGNESIUM HYDROXIDE 400 MG/5ML PO SUSP: 30.0000 mL | Freq: Every day | ORAL | Status: DC | PRN

## 2021-07-11 MED ORDER — ENOXAPARIN SODIUM 30 MG/0.3ML IJ SOSY
30.0000 mg | PREFILLED_SYRINGE | INTRAMUSCULAR | Status: DC
  Administered 2021-07-11 – 2021-07-26 (×15): 30 mg via SUBCUTANEOUS
  Filled 2021-07-11 (×16): qty 0.3

## 2021-07-11 MED ORDER — FAMOTIDINE 20 MG PO TABS
20.0000 mg | ORAL_TABLET | Freq: Two times a day (BID) | ORAL | Status: DC
  Administered 2021-07-11 – 2021-07-12 (×2): 20 mg via ORAL
  Filled 2021-07-11 (×2): qty 1

## 2021-07-11 MED ORDER — ONDANSETRON HCL 4 MG/2ML IJ SOLN: 4.0000 mg | Freq: Four times a day (QID) | INTRAMUSCULAR | Status: DC | PRN

## 2021-07-11 MED ORDER — SODIUM CHLORIDE 0.9 % IV SOLN
100.0000 mg | Freq: Every day | INTRAVENOUS | Status: AC
  Administered 2021-07-12 – 2021-07-15 (×4): 100 mg via INTRAVENOUS
  Filled 2021-07-11 (×2): qty 20
  Filled 2021-07-11 (×2): qty 100
  Filled 2021-07-11: qty 20

## 2021-07-11 MED ORDER — IPRATROPIUM-ALBUTEROL 20-100 MCG/ACT IN AERS
2.0000 | INHALATION_SPRAY | Freq: Four times a day (QID) | RESPIRATORY_TRACT | Status: DC
  Administered 2021-07-12 – 2021-07-22 (×41): 2 via RESPIRATORY_TRACT
  Filled 2021-07-11: qty 4

## 2021-07-11 MED ORDER — ASCORBIC ACID 500 MG PO TABS
500.0000 mg | ORAL_TABLET | Freq: Every day | ORAL | Status: DC
  Administered 2021-07-11 – 2021-07-28 (×15): 500 mg via ORAL
  Filled 2021-07-11 (×17): qty 1

## 2021-07-11 NOTE — Progress Notes (Signed)
PHARMACIST - PHYSICIAN COMMUNICATION  CONCERNING:  Enoxaparin (Lovenox) for DVT Prophylaxis    RECOMMENDATION: Patient was prescribed enoxaprin 40mg  q24 hours for VTE prophylaxis.   Filed Weights   07/25/2021 1706  Weight: 45 kg (99 lb 4.8 oz)    Body mass index is 17.59 kg/m.  Estimated Creatinine Clearance: 24.6 mL/min (A) (by C-G formula based on SCr of 1.1 mg/dL (H)).  Patient is candidate for enoxaparin 30mg  every 24 hours based on CrCl <28ml/min or Weight <45kg  DESCRIPTION: Pharmacy has adjusted enoxaparin dose per [facility] policy.  Patient is now receiving enoxaparin 30 mg every 24 hours    Vira Blanco, PharmD Clinical Pharmacist  07/12/2021 8:10 PM

## 2021-07-11 NOTE — H&P (Addendum)
[place]   PATIENT NAME: [name] [name]    MR#:  [id number]  DATE OF BIRTH:  24-Jun-1930  DATE OF ADMISSION:  07/18/2021  PRIMARY CARE PHYSICIAN: Jerrol Banana., MD   Patient is coming from: Home  REQUESTING/REFERRING PHYSICIAN: Conni Slipper, MD  CHIEF COMPLAINT:   Chief Complaint  Patient presents with   Shortness of Breath    HISTORY OF PRESENT ILLNESS:  [name] [name] is a 88 y.o. Caucasian female with medical history significant for asthma/COPD and hypertension, who presented to the ER with acute onset of worsening dyspnea over the last 3 days.  The patient had a positive COVID-19 test on Thursday.  She has not been eating or drinking for the last few days.  She was noted to have mild confusion in the ER.  No loss of taste or smell.  She did have nausea, vomiting and diarrhea per her daughter today.  No chest pain or palpitations.  Per EMS her pulse oximetry was 81% on room air.  She was placed on 10 L with nonrebreather and pulse oximetry was in the mid 90s.  She was also given 1 DuoNeb and 1 albuterol nebulizer on route to the hospital.  She admitted to dry cough without expectoration.  She denies any nausea or vomiting however admits to lower abdominal pain.  No chest pain or palpitations.    ED Course: Upon presentation to the emergency room respiratory rate was 34 and pulse oximetry 87% on room air with a pulse oximetry of 144/59.  Labs revealed a VBG with pH 7.4 and HCO3 31.  BMP was remarkable for hypokalemia hyponatremia and hypochloremia and CBC showed leukopenia of 2.1 and thrombocytopenia with otherwise normal levels.  Influenza antigens came back negative.  COVID-19 PCR came back positive. EKG as reviewed by me : EKG showed sinus tachycardia with a rate of 1 1 with LAD and LVH. Imaging: Chest x-ray showed opacity in the left midlung concerning for pneumonia, emphysema, aortic atherosclerosis and severe elevation of the left hemidiaphragm.  The  patient was given 40 mg of IV Lasix, DuoNeb and albuterol.  She will be admitted to a medical telemetry bed for further evaluation and management. PAST MEDICAL HISTORY:   Past Medical History:  Diagnosis Date   Asthma    Cataract    COPD (chronic obstructive pulmonary disease) ([place])    -FeV1 74% DLCO 56%   Emphysema    HTN (hypertension)     PAST SURGICAL HISTORY:   Past Surgical History:  Procedure Laterality Date   ankle surgery with implants Left 1994   Bening Tumor Removal from Thyroid   1950's   EYE SURGERY  2014   catarac   HERNIA REPAIR  1960's   Lacrimal gland surgery  2000   lumbar spine fracture and ankle fracture     MASS EXCISION N/A 02/15/2021   Procedure: TRANSANAL MASS EXCISION;  Surgeon: Robert Bellow, MD;  Location: [facility] ORS;  Service: General;  Laterality: N/A;  transanal   Right hip surgery with implants   1996   and LEFT Hip   status post nasal miacalcin     TONSILLECTOMY AND ADENOIDECTOMY  1950   TOTAL HIP ARTHROPLASTY Bilateral     SOCIAL HISTORY:   Social History   Tobacco Use   Smoking status: Former    Types: Cigarettes    Quit date: 07/11/2002    Years since quitting: 19.0   Smokeless tobacco: Never  Substance  Use Topics   Alcohol use: Yes    Alcohol/week: 1.0 standard drink    Types: 1 Glasses of wine per week    Comment: occasionally    FAMILY HISTORY:   Family History  Problem Relation Age of Onset   Ovarian cancer Mother    Heart attack Father    Heart disease Brother    Asthma Other     DRUG ALLERGIES:   Allergies  Allergen Reactions   Ciprofloxacin     Unknown   Macrobid  [Nitrofurantoin]     Unknown reaction   Metoprolol Swelling    feet swelling/pain   Sulfa Antibiotics     Upset stomach    Sulfonamide Derivatives     Upset stomach    REVIEW OF SYSTEMS:   ROS As per history of present illness. All pertinent systems were reviewed above. Constitutional, HEENT, cardiovascular, respiratory, GI, GU,  musculoskeletal, neuro, psychiatric, endocrine, integumentary and hematologic systems were reviewed and are otherwise negative/unremarkable except for positive findings mentioned above in the HPI.   MEDICATIONS AT HOME:   Prior to Admission medications   Medication Sig Start Date End Date Taking? Authorizing Provider  benzonatate (TESSALON) 100 MG capsule Take 1 capsule (100 mg total) by mouth 3 (three) times daily as needed. 07/07/21   [name], PA-C  Calcium-Vitamin D-Vitamin K (VIACTIV CALCIUM PLUS D) 650-12.5-40 MG-MCG-MCG CHEW Chew 2 tablets by mouth daily.    [provider]  fluticasone-salmeterol (ADVAIR) 250-50 MCG/ACT AEPB TAKE ONE PUFF TWICE DAILY. RINSE MOUTH. 02/19/21   Jerrol Banana., MD  hydrochlorothiazide (HYDRODIURIL) 25 MG tablet TAKE 1 TABLET BY MOUTH DAILY USUALLY IN THE MORNING. TAKE WITH LOSARTAN. 05/05/21   Jerrol Banana., MD  HYDROcodone-acetaminophen (NORCO/VICODIN) 5-325 MG tablet TAKE 1 TABLET BY MOUTH EVERY 6 HOURS AS NEEDED FOR MODERATE PAIN 06/08/21   Jerrol Banana., MD  losartan (COZAAR) 100 MG tablet TAKE 1 TABLET BY MOUTH DAILY USUALLY IN THE MORNING. TAKE WITH HCTZ. 05/05/21   Jerrol Banana., MD  molnupiravir EUA (LAGEVRIO) 200 mg CAPS capsule Take 4 capsules (800 mg total) by mouth 2 (two) times daily for 5 days. 07/07/21 07/12/21  [name], PA-C  ondansetron (ZOFRAN) 4 MG tablet Take 1 tablet (4 mg total) by mouth every 8 (eight) hours as needed for nausea or vomiting. 01/13/21   Jerrol Banana., MD      VITAL SIGNS:  Blood pressure (!) 140/54, pulse (!) 101, resp. rate (!) 25, height 5\' 3"  (1.6 m), weight 45 kg, SpO2 93 %.  PHYSICAL EXAMINATION:  Physical Exam  GENERAL:  88 y.o.-year-old Caucasian female patient lying in the bed with mild respiratory distress with conversational dyspnea on 100% nonrebreather. EYES: Pupils equal, round, reactive to light and accommodation. No scleral  icterus. Extraocular muscles intact.  HEENT: Head atraumatic, normocephalic. Oropharynx and nasopharynx clear.  NECK:  Supple, no jugular venous distention. No thyroid enlargement, no tenderness.  LUNGS: Diminished bibasilar breath sounds with bibasal crackles more on the left. CARDIOVASCULAR: Regular rate and rhythm, S1, S2 normal. No murmurs, rubs, or gallops.  ABDOMEN: Soft, nondistended, nontender. Bowel sounds present. No organomegaly or mass.  EXTREMITIES: No pedal edema, cyanosis, or clubbing.  NEUROLOGIC: Cranial nerves II through XII are intact. Muscle strength 5/5 in all extremities. Sensation intact. Gait not checked.  PSYCHIATRIC: The patient is alert and oriented x 3.  Normal affect and good eye contact. SKIN: No obvious rash, lesion, or ulcer.  LABORATORY PANEL:   CBC Recent Labs  Lab 07/31/2021 1723  WBC 2.1*  HGB 12.2  HCT 36.4  PLT 110*   ------------------------------------------------------------------------------------------------------------------  Chemistries  Recent Labs  Lab 07/18/2021 1723  NA 130*  K 2.8*  CL 93*  CO2 28  GLUCOSE 122*  BUN 19  CREATININE 1.10*  CALCIUM 8.5*   ------------------------------------------------------------------------------------------------------------------  Cardiac Enzymes No results for input(s): TROPONINI in the last 168 hours. ------------------------------------------------------------------------------------------------------------------  RADIOLOGY:  DG Chest Port 1 View  Result Date: 07/17/2021 CLINICAL DATA:  88 year old female with history of shortness of breath over the past 3 days. EXAM: PORTABLE CHEST 1 VIEW COMPARISON:  Chest x-ray 02/03/2016. FINDINGS: Severe elevation of the left hemidiaphragm. New opacity in the periphery of the left mid lung. Emphysematous changes throughout the lungs bilaterally. Diffuse peribronchial cuffing. No definite pleural effusions. No pneumothorax. No evidence of pulmonary  edema. Heart size is mildly enlarged. Upper mediastinal contours are within normal limits. Atherosclerotic calcifications are noted in the thoracic aorta. IMPRESSION: 1. Opacity in the left mid lung concerning for pneumonia. Alternatively, in the appropriate clinical setting, this could represent hemorrhage from pulmonary infarct. 2. Emphysema. 3. Aortic atherosclerosis. 4. Severe elevation of the left hemidiaphragm. Electronically Signed   By: Vinnie Langton M.D.   On: 07/22/2021 17:49      IMPRESSION AND PLAN:  Principal Problem:   Acute hypoxemic respiratory failure due to COVID-19 ([employer])  1.  Acute hypoxemic respiratory failure secondary to COVID-19. -The patient will be admitted to a medically monitored isolation bed. -O2 protocol will be followed to keep O2 saturation above 93.   2.  Multifocal pneumonia secondary to COVID-19. -The patient will be admitted to an isolation monitored bed with droplet and contact precautions. -Given multifocal pneumonia we will empirically place the patient on IV Rocephin and Zithromax for possible bacterial superinfection only with elevated Procalcitonin. -The patient will be placed on scheduled Mucinex and as needed Tussionex. -We will avoid nebulization as much as we can, give bronchodilator MDI if needed -Will obtain sputum Gram stain culture and sensitivity and follow blood cultures. -O2 protocol will be followed. -We will follow CRP, ferritin, LDH and D-dimer. -Will follow daily CBC with manual differential and CMP. - Will place the patient on IV Remdesivir and IV steroid therapy with IV Solu-Medrol with elevated inflammatory markers. -The patient will be placed on vitamin D3, vitamin C, zinc sulfate, p.o. Pepcid and aspirin. -I discussed Baricitinib and the patient agreed to proceed with it.  3.  COPD/asthma with mild exacerbation. - The patient will be placed on bronchodilator nebulizer therapy as well as steroid therapy as mentioned above. -  Mucolytic therapy will be provided. - Sputum culture will be obtained.  4.  Hyponatremia. - The patient will be hydrated with IV normal saline and will follow BMP.  5.  Hypokalemia. - Potassium replacement will be provided and magnesium level will be checked.  6.  Essential hypertension. - We will continue her antihypertensive therapy while holding off diuretics.  DVT prophylaxis: Lovenox. Code Status: Partial code: DO NOT INTUBATE only. Family Communication:  The plan of care was discussed in details with the patient (and family). I answered all questions. The patient agreed to proceed with the above mentioned plan. Further management will depend upon hospital course. Disposition Plan: Back to previous home environment Consults called: none. All the records are reviewed and case discussed with ED provider.  Status is: Inpatient  At the time of the admission, it appears that the  appropriate admission status for this patient is inpatient.  This is judged to be reasonable and necessary in order to provide the required intensity of service to ensure the patient's safety given the presenting symptoms, physical exam findings and initial radiographic and laboratory data in the context of comorbid conditions.  The patient requires inpatient status due to high intensity of service, high risk of further deterioration and high frequency of surveillance required.  I certify that at the time of admission, it is my clinical judgment that the patient will require inpatient hospital care extending more than 2 midnights.                            Dispo: The patient is from: Home              Anticipated d/c is to: Home              Patient currently is not medically stable to d/c.              Difficult to place patient: No    Christel Mormon M.D on 07/31/2021 at 7:31 PM  [facility]   From 7 PM-7 AM, contact night-coverage [email or web address]  CC: Primary care physician; Jerrol Banana.,  MD

## 2021-07-11 NOTE — ED Provider Notes (Signed)
[facility] Provider Note    Event Date/Time   First MD Initiated Contact with Patient 08/06/2021 1715     (approximate)   History   Shortness of Breath   HPI  [name] [name] is a 88 y.o. female who was reportedly diagnosed with COVID on Thursday.  She has been getting more short of breath over the last few days.  Pulse ox reported by EMS to be 81 on arrival they put her on 10 L nonrebreather.  I also gave her 1 DuoNeb and albuterol.  Patient is awake and alert.  She says that she is coughing and also has some lower abdominal pain.  This pain is reproduced by palpation of the lower abdomen.  All these problems appear to have began on Thursday and gotten worse slowly.  Patient does not report any productive cough.  Does not seem to have any pleuritic chest pain.      Physical Exam   Triage Vital Signs: ED Triage Vitals  Enc Vitals Group     BP --      Pulse --      Resp --      Temp --      Temp src --      SpO2 07/12/2021 1702 97 %     Weight 07/21/2021 1706 99 lb 4.8 oz (45 kg)     Height 07/17/2021 1706 5\' 3"  (1.6 m)     Head Circumference --      Peak Flow --      Pain Score 08/04/2021 1706 0     Pain Loc --      Pain Edu? --      Excl. in [place]? --     Most recent vital signs: Vitals:   07/27/2021 1730 07/31/2021 2130  BP: (!) 140/54 136/62  Pulse: (!) 101 90  Resp: (!) 25 (!) 22  SpO2: 93% 98%     General: Awake, no distress.  CV:  Good peripheral perfusion.  Heart regular rate and rhythm no audible murmurs Resp:  Normal effort.  Lungs slightly decreased breath sounds throughout but clear with no audible crackles Abd:  No distention.  Normal bowel sounds there is tenderness to percussion but not palpation in the lower abdomen diffusely. Extremities with no edema   ED Results / Procedures / Treatments   Labs (all labs ordered are listed, but only abnormal results are displayed) Labs Reviewed  RESP PANEL BY RT-PCR (FLU A&B, COVID) [id number]  - Abnormal; Notable for the following components:      Result Value   SARS Coronavirus 2 by RT PCR POSITIVE (*)    All other components within normal limits  CBC WITH DIFFERENTIAL/PLATELET - Abnormal; Notable for the following components:   WBC 2.1 (*)    Platelets 110 (*)    Neutro Abs 1.5 (*)    Lymphs Abs 0.4 (*)    All other components within normal limits  BASIC METABOLIC PANEL - Abnormal; Notable for the following components:   Sodium 130 (*)    Potassium 2.8 (*)    Chloride 93 (*)    Glucose, Bld 122 (*)    Creatinine, Ser 1.10 (*)    Calcium 8.5 (*)    GFR, Estimated 48 (*)    All other components within normal limits  BLOOD GAS, VENOUS - Abnormal; Notable for the following components:   Bicarbonate 31.0 (*)    Acid-Base Excess 5.1 (*)    All other  components within normal limits  D-DIMER, QUANTITATIVE - Abnormal; Notable for the following components:   D-Dimer, Quant 1.58 (*)    All other components within normal limits  MAGNESIUM - Abnormal; Notable for the following components:   Magnesium 1.3 (*)    All other components within normal limits  TROPONIN I (HIGH SENSITIVITY) - Abnormal; Notable for the following components:   Troponin I (High Sensitivity) 18 (*)    All other components within normal limits  TROPONIN I (HIGH SENSITIVITY) - Abnormal; Notable for the following components:   Troponin I (High Sensitivity) 19 (*)    All other components within normal limits  LACTIC ACID, PLASMA  PROCALCITONIN  BRAIN NATRIURETIC PEPTIDE  FIBRINOGEN  FERRITIN  LACTATE DEHYDROGENASE  CBC WITH DIFFERENTIAL/PLATELET  COMPREHENSIVE METABOLIC PANEL  C-REACTIVE PROTEIN  D-DIMER, QUANTITATIVE  FERRITIN  C-REACTIVE PROTEIN  TROPONIN I (HIGH SENSITIVITY)  TROPONIN I (HIGH SENSITIVITY)  Past medical history cut-and-paste from old records to assist in my knowledge of what is going on with this lady Active Problems Problem Noted Date  Afib 04/08/2014  Asthma 08/06/2007   Overview:    Formatting of this note might be different from the original. Overview:  Qualifier: Diagnosis of  By: Burt Knack CMA, Lori   Emphysema/COPD    Hypertension    Osteoporosis     EKG  EKG read and interpreted by me and compared to EKG from 02/16/2021 available in the old records this EKG shows sinus tachycardia rate of 101 left axis nonspecific ST-T wave changes compared to prior EKG there is some more T wave flattening but no acute changes are present.   RADIOLOGY Chest x-ray read by radiologist as left sided opacity.  Despite 5 separate attempts to review the film I have not been able to access this particular film.   PROCEDURES:  Critical Care performed: Critical care 20 minutes this includes speaking to the patient and EMS about her and reviewing her old records and her old EKG.  I also spent at least 5 minutes looking for a way to check her chest x-ray on 3 separate occasions.  Procedures   MEDICATIONS ORDERED IN ED: Medications  losartan (COZAAR) tablet 100 mg (has no administration in time range)  calcium-vitamin D (OSCAL WITH D) 500-5 MG-MCG per tablet 2 tablet (has no administration in time range)  benzonatate (TESSALON) capsule 100 mg (has no administration in time range)  remdesivir 200 mg in sodium chloride 0.9% 250 mL IVPB (has no administration in time range)    Followed by  remdesivir 100 mg in sodium chloride 0.9 % 100 mL IVPB (has no administration in time range)  methylPREDNISolone sodium succinate (SOLU-MEDROL) 125 mg/2 mL injection 45 mg (45 mg Intravenous Given 07/15/2021 2127)    Followed by  predniSONE (DELTASONE) tablet 50 mg (has no administration in time range)  baricitinib (OLUMIANT) tablet 2 mg (has no administration in time range)  ascorbic acid (VITAMIN C) tablet 500 mg (500 mg Oral Given 08/07/2021 2126)  zinc sulfate capsule 220 mg (220 mg Oral Given 07/31/2021 2126)  guaiFENesin-dextromethorphan (ROBITUSSIN DM) 100-10 MG/5ML syrup 10 mL (has no  administration in time range)  chlorpheniramine-HYDROcodone (TUSSIONEX) 10-8 MG/5ML suspension 5 mL (has no administration in time range)  famotidine (PEPCID) tablet 20 mg (20 mg Oral Given 07/28/2021 2126)  acetaminophen (TYLENOL) tablet 650 mg (has no administration in time range)  magnesium hydroxide (MILK OF MAGNESIA) suspension 30 mL (has no administration in time range)  ondansetron (ZOFRAN) tablet 4 mg (has  no administration in time range)    Or  ondansetron (ZOFRAN) injection 4 mg (has no administration in time range)  guaiFENesin (MUCINEX) 12 hr tablet 600 mg (600 mg Oral Given 07/22/2021 2126)  cholecalciferol (VITAMIN D3) tablet 1,000 Units (1,000 Units Oral Given 07/27/2021 2126)  enoxaparin (LOVENOX) injection 30 mg (30 mg Subcutaneous Given 07/20/2021 2126)  potassium chloride (KLOR-CON) packet 40 mEq (has no administration in time range)  Ipratropium-Albuterol (COMBIVENT) respimat 2 puff (has no administration in time range)  cefTRIAXone (ROCEPHIN) 1 g in sodium chloride 0.9 % 100 mL IVPB (0 g Intravenous Stopped 07/21/2021 2029)  azithromycin (ZITHROMAX) 500 mg in sodium chloride 0.9 % 250 mL IVPB (500 mg Intravenous New Bag/Given 08/07/2021 2117)     IMPRESSION / MDM / ASSESSMENT AND PLAN / ED COURSE  I reviewed the triage vital signs and the nursing notes.                              Differential diagnosis includes, but is not limited to bacterial pneumonia viral pneumonia of course COVID is included in viral pneumonia.  Patient could have had CHF or the flu or pulmonary embolus although her history 10 rule out pulmonary embolus. The patient is on the cardiac monitor to evaluate for evidence of arrhythmia and/or significant heart rate changes.  Labs and x-rays were reviewed.  Patient did end up having COVID.  Her white blood count was low with a fairly large percentage of polys.  This is worrisome for sepsis becoming overwhelming.  Last several white blood counts were nowhere near this low.  Her  procalcitonin was somewhat elevated as well although her lactic acid was not.  This makes me suspicious that she has both bacterial and viral pneumonia. Because of this I gave the patient antibiotics.  Additionally I discussed during detail with the hospital physician.  We will admit this patient.        FINAL CLINICAL IMPRESSION(S) / ED DIAGNOSES   Final diagnoses:  COVID  Community acquired pneumonia, unspecified laterality  Dyspnea, unspecified type     Rx / DC Orders   ED Discharge Orders     None        Note:  This document was prepared using Dragon voice recognition software and may include unintentional dictation errors.   Nena Polio, MD 07/12/21 [phone]

## 2021-07-11 NOTE — ED Notes (Signed)
RN attempted to give pt her pills. Pt unable to take them.

## 2021-07-11 NOTE — ED Notes (Signed)
PT pulled out her IV. Multiple attempts to put another IV in pt. IV team consulted.

## 2021-07-11 NOTE — ED Triage Notes (Signed)
Pt to ED via [facility] from Home shortness of breath. Pt diagnosed with COVID on Thursday. Pt has been more short of breath over the last 3 days. Pulse Ox 81 upon EMS arrival. Pt was placed on 10 liters non breather. Pt was given 1 duoneb and albuterol breathing breathing in route. Pt alert at this time.

## 2021-07-12 DIAGNOSIS — U071 COVID-19: Secondary | ICD-10-CM | POA: Diagnosis not present

## 2021-07-12 DIAGNOSIS — J441 Chronic obstructive pulmonary disease with (acute) exacerbation: Secondary | ICD-10-CM | POA: Diagnosis not present

## 2021-07-12 DIAGNOSIS — J9601 Acute respiratory failure with hypoxia: Secondary | ICD-10-CM | POA: Diagnosis not present

## 2021-07-12 LAB — CBC WITH DIFFERENTIAL/PLATELET
Abs Immature Granulocytes: 0.02 10*3/uL (ref 0.00–0.07)
Basophils Absolute: 0 10*3/uL (ref 0.0–0.1)
Basophils Relative: 1 %
Eosinophils Absolute: 0 10*3/uL (ref 0.0–0.5)
Eosinophils Relative: 0 %
HCT: 32.9 % — ABNORMAL LOW (ref 36.0–46.0)
Hemoglobin: 11.1 g/dL — ABNORMAL LOW (ref 12.0–15.0)
Immature Granulocytes: 1 %
Lymphocytes Relative: 40 %
Lymphs Abs: 0.8 10*3/uL (ref 0.7–4.0)
MCH: 31.6 pg (ref 26.0–34.0)
MCHC: 33.7 g/dL (ref 30.0–36.0)
MCV: 93.7 fL (ref 80.0–100.0)
Monocytes Absolute: 0.2 10*3/uL (ref 0.1–1.0)
Monocytes Relative: 8 %
Neutro Abs: 1 10*3/uL — ABNORMAL LOW (ref 1.7–7.7)
Neutrophils Relative %: 50 %
Platelets: 105 10*3/uL — ABNORMAL LOW (ref 150–400)
RBC: 3.51 MIL/uL — ABNORMAL LOW (ref 3.87–5.11)
RDW: 12.9 % (ref 11.5–15.5)
WBC: 2 10*3/uL — ABNORMAL LOW (ref 4.0–10.5)
nRBC: 0 % (ref 0.0–0.2)

## 2021-07-12 LAB — C-REACTIVE PROTEIN
CRP: 8.5 mg/dL — ABNORMAL HIGH (ref <1.0)
CRP: 13.8 mg/dL — ABNORMAL HIGH (ref <1.0)

## 2021-07-12 LAB — COMPREHENSIVE METABOLIC PANEL
ALT: 12 U/L (ref 0–44)
AST: 16 U/L (ref 15–41)
Albumin: 3.1 g/dL — ABNORMAL LOW (ref 3.5–5.0)
Alkaline Phosphatase: 48 U/L (ref 38–126)
Anion gap: 9 (ref 5–15)
BUN: 23 mg/dL (ref 8–23)
CO2: 28 mmol/L (ref 22–32)
Calcium: 7.8 mg/dL — ABNORMAL LOW (ref 8.9–10.3)
Chloride: 97 mmol/L — ABNORMAL LOW (ref 98–111)
Creatinine, Ser: 1.06 mg/dL — ABNORMAL HIGH (ref 0.44–1.00)
GFR, Estimated: 50 mL/min — ABNORMAL LOW (ref >60)
Glucose, Bld: 100 mg/dL — ABNORMAL HIGH (ref 70–99)
Potassium: 3 mmol/L — ABNORMAL LOW (ref 3.5–5.1)
Sodium: 134 mmol/L — ABNORMAL LOW (ref 135–145)
Total Bilirubin: 0.5 mg/dL (ref 0.3–1.2)
Total Protein: 5.5 g/dL — ABNORMAL LOW (ref 6.5–8.1)

## 2021-07-12 LAB — TROPONIN I (HIGH SENSITIVITY): Troponin I (High Sensitivity): 26 ng/L — ABNORMAL HIGH (ref <18)

## 2021-07-12 LAB — FERRITIN: Ferritin: 144 ng/mL (ref 11–307)

## 2021-07-12 MED ORDER — POTASSIUM CHLORIDE CRYS ER 20 MEQ PO TBCR: 60.0000 meq | EXTENDED_RELEASE_TABLET | Freq: Once | ORAL | Status: DC

## 2021-07-12 NOTE — Progress Notes (Signed)
PROGRESS NOTE    [name] [name]  PZW:[id number] DOB: 1929-09-12 DOA: 07/29/2021 PCP: Jerrol Banana., MD    Assessment & Plan:   Principal Problem:   Acute hypoxemic respiratory failure due to COVID-19 [facility] 2)   Acute hypoxic respiratory failure: found to be 87% on RA. Likely secondary to [place]. Continue on supplemental oxygen and wean as tolerated  COVID19 pneumonia: continue on IV remdesivir, steroids, bronchodilators, and encourage incentive spirometry   COPD exacerbation: continue on steroids, bronchodilators and encourage incentive spirometry  Hyponatremia: trending up from day prior.   Hypokalemia: potassium given   HTN: continue on home dose of losartan   Thrombocytopenia: etiology unclear. Will continue to monitor    DVT prophylaxis: lovenox  Code Status: partial  Family Communication: called pt's daughter, [name], no answer  Disposition Plan: depends on PT/OT recs  Level of care: Telemetry Medical  Status is: Inpatient  Remains inpatient appropriate because: severity of illness, still requiring supplemental oxygen      Consultants:    Procedures:   Antimicrobials:   Subjective: Pt c/o shortness of breath   Objective: Vitals:   07/12/21 1215 07/12/21 1230 07/12/21 1245 07/12/21 1300  BP:  (!) 124/105  (!) 119/92  Pulse: 82 88 92 83  Resp: (!) 23 (!) 26 (!) 31 (!) 27  Temp:      TempSrc:      SpO2: 100% 92% 96% 95%  Weight:      Height:        Intake/Output Summary (Last 24 hours) at 07/12/2021 1518 Last data filed at 07/12/2021 0144 Gross per 24 hour  Intake 540.09 ml  Output --  Net 540.09 ml   Filed Weights   07/24/2021 1706  Weight: 45 kg    Examination:  General exam: Appears calm and comfortable  Respiratory system: diminished breath sounds b/l  Cardiovascular system: S1 & S2 +. No gallops or rubs  Gastrointestinal system: Abdomen is nondistended, soft and nontender. Normal bowel sounds heard. Central nervous system:  Alert and oriented.Moves all extremities  Psychiatry: Judgement and insight appear normal. Flat mood and affect   Data Reviewed: I have personally reviewed following labs and imaging studies  CBC: Recent Labs  Lab 08/09/2021 1723 07/12/21 0829  WBC 2.1* 2.0*  NEUTROABS 1.5* 1.0*  HGB 12.2 11.1*  HCT 36.4 32.9*  MCV 93.3 93.7  PLT 110* 423*   Basic Metabolic Panel: Recent Labs  Lab 07/28/2021 1723 07/19/2021 2029 07/12/21 0829  NA 130*  --  134*  K 2.8*  --  3.0*  CL 93*  --  97*  CO2 28  --  28  GLUCOSE 122*  --  100*  BUN 19  --  23  CREATININE 1.10*  --  1.06*  CALCIUM 8.5*  --  7.8*  MG  --  1.3*  --    GFR: Estimated Creatinine Clearance: 25.6 mL/min (A) (by C-G formula based on SCr of 1.06 mg/dL (H)). Liver Function Tests: Recent Labs  Lab 07/12/21 0829  AST 16  ALT 12  ALKPHOS 48  BILITOT 0.5  PROT 5.5*  ALBUMIN 3.1*   No results for input(s): LIPASE, AMYLASE in the last 168 hours. No results for input(s): AMMONIA in the last 168 hours. Coagulation Profile: No results for input(s): INR, PROTIME in the last 168 hours. Cardiac Enzymes: No results for input(s): CKTOTAL, CKMB, CKMBINDEX, TROPONINI in the last 168 hours. BNP (last 3 results) No results for input(s): PROBNP in the last 8760 hours.  HbA1C: No results for input(s): HGBA1C in the last 72 hours. CBG: No results for input(s): GLUCAP in the last 168 hours. Lipid Profile: No results for input(s): CHOL, HDL, LDLCALC, TRIG, CHOLHDL, LDLDIRECT in the last 72 hours. Thyroid Function Tests: No results for input(s): TSH, T4TOTAL, FREET4, T3FREE, THYROIDAB in the last 72 hours. Anemia Panel: Recent Labs    07/31/2021 2029 07/12/21 0829  FERRITIN 137 144   Sepsis Labs: Recent Labs  Lab 08/06/2021 2029  PROCALCITON 0.38  LATICACIDVEN 1.7    Recent Results (from the past 240 hour(s))  Resp Panel by RT-PCR (Flu A&B, Covid) Nasopharyngeal Swab     Status: Abnormal   Collection Time: 07/27/2021  5:23 PM    Specimen: Nasopharyngeal Swab; Nasopharyngeal(NP) swabs in vial transport medium  Result Value Ref Range Status   SARS Coronavirus 2 by RT PCR POSITIVE (A) NEGATIVE Final    Comment: (NOTE) SARS-CoV-2 target nucleic acids are DETECTED.  The SARS-CoV-2 RNA is generally detectable in upper respiratory specimens during the acute phase of infection. Positive results are indicative of the presence of the identified virus, but do not rule out bacterial infection or co-infection with other pathogens not detected by the test. Clinical correlation with patient history and other diagnostic information is necessary to determine patient infection status. The expected result is Negative.  Fact Sheet for Patients: EntrepreneurPulse.com.au  Fact Sheet for Healthcare Providers: IncredibleEmployment.be  This test is not yet approved or cleared by the [place] FDA and  has been authorized for detection and/or diagnosis of SARS-CoV-2 by FDA under an Emergency Use Authorization (EUA).  This EUA will remain in effect (meaning this test can be used) for the duration of  the COVID-19 declaration under Section 564(b)(1) of the A ct, 21 U.S.C. section 360bbb-3(b)(1), unless the authorization is terminated or revoked sooner.     Influenza A by PCR NEGATIVE NEGATIVE Final   Influenza B by PCR NEGATIVE NEGATIVE Final    Comment: (NOTE) The Xpert Xpress SARS-CoV-2/FLU/RSV plus assay is intended as an aid in the diagnosis of influenza from Nasopharyngeal swab specimens and should not be used as a sole basis for treatment. Nasal washings and aspirates are unacceptable for Xpert Xpress SARS-CoV-2/FLU/RSV testing.  Fact Sheet for Patients: EntrepreneurPulse.com.au  Fact Sheet for Healthcare Providers: IncredibleEmployment.be  This test is not yet approved or cleared by the [place] FDA and has been authorized for detection  and/or diagnosis of SARS-CoV-2 by FDA under an Emergency Use Authorization (EUA). This EUA will remain in effect (meaning this test can be used) for the duration of the COVID-19 declaration under Section 564(b)(1) of the Act, 21 U.S.C. section 360bbb-3(b)(1), unless the authorization is terminated or revoked.  Performed at [facility], [address], [place], [place] [address]          Radiology Studies: DG Chest [place] 1 View  Result Date: 08/01/2021 CLINICAL DATA:  88 year old female with history of shortness of breath over the past 3 days. EXAM: PORTABLE CHEST 1 VIEW COMPARISON:  Chest x-ray 02/03/2016. FINDINGS: Severe elevation of the left hemidiaphragm. New opacity in the periphery of the left mid lung. Emphysematous changes throughout the lungs bilaterally. Diffuse peribronchial cuffing. No definite pleural effusions. No pneumothorax. No evidence of pulmonary edema. Heart size is mildly enlarged. Upper mediastinal contours are within normal limits. Atherosclerotic calcifications are noted in the thoracic aorta. IMPRESSION: 1. Opacity in the left mid lung concerning for pneumonia. Alternatively, in the appropriate clinical setting, this could represent hemorrhage  from pulmonary infarct. 2. Emphysema. 3. Aortic atherosclerosis. 4. Severe elevation of the left hemidiaphragm. Electronically Signed   By: Vinnie Langton M.D.   On: 07/19/2021 17:49        Scheduled Meds:  vitamin C  500 mg Oral Daily   baricitinib  2 mg Oral Daily   calcium-vitamin D  2 tablet Oral Daily   cholecalciferol  1,000 Units Oral Daily   enoxaparin (LOVENOX) injection  30 mg Subcutaneous Q24H   famotidine  20 mg Oral BID   guaiFENesin  600 mg Oral BID   Ipratropium-Albuterol  2 puff Inhalation QID   losartan  100 mg Oral Daily   methylPREDNISolone (SOLU-MEDROL) injection  1 mg/kg Intravenous Q12H   Followed by   Derrill Memo ON 07/14/2021] predniSONE  50 mg Oral Daily   potassium chloride  60 mEq  Oral Once   zinc sulfate  220 mg Oral Daily   Continuous Infusions:  remdesivir 100 mg in NS 100 mL Stopped (07/12/21 1243)     LOS: 1 day    Time spent: 30 mins     Wyvonnia Dusky, MD [facility] Pager [phone]  If 7PM-7AM, please contact night-coverage 07/12/2021, 3:18 PM

## 2021-07-12 NOTE — Evaluation (Signed)
Occupational Therapy Evaluation Patient Details Name: TOMMY MINICHIELLO MRN: [id number] DOB: 09/27/1929 Today's Date: 07/12/2021   History of Present Illness 88 y.o. Caucasian female with medical history significant for asthma/COPD and hypertension, who presented to the ER with acute onset of worsening dyspnea over the last 3 days. COVID-19 + on 07/08/21.   Clinical Impression   Pt was seen for OT evaluation this date. Prior to hospital admission, pt was modified independent with ADL (sink bath) and mobility (2WW for household distances, rollator for community distances) and denies falls in past 51mo. Adult children assist with transportation as pt does not drive anymore. Over the past week, pt has required increased sup/assist at home for IADL as pt has not been feeling well. Dtr and son have been coordinating 24/7. Pt lives in a 2 story home but lives on main floor (1/2 bath available, but takes sink baths). Currently pt demonstrates mild impairments in activity tolerance and cardiopulmonary status as described below (See OT problem list) which functionally limit her ability to perform ADL/self-care tasks. Pt currently requires SBA-CGA for ADL transfers and mobility in the room using 2WW, set up assist for donning slip on shoes. Endorses mild SOB with in room mobility from stretcher to the toilet. SpO2 on room air desats to mid 80's with exertion, improves to high 80's with rest, and mid 90's with 2L O2 reapplied. Pt/dtr educated in energy conservation strategies to support ADL/IADL participation while minimizing risk of falls/over exertion/SOB. Both verbalized understanding. Pt would benefit from skilled OT services to address noted impairments and functional limitations (see below for any additional details) in order to maximize safety and independence while minimizing falls risk and caregiver burden. Upon hospital discharge, recommend [place] and initial 24/7 sup/assist from family to maximize pt safety and  return to functional independence during meaningful occupations of daily life. Dtr endorses ability to provide needed level of assist.     Recommendations for follow up therapy are one component of a multi-disciplinary discharge planning process, led by the attending physician.  Recommendations may be updated based on patient status, additional functional criteria and insurance authorization.   Follow Up Recommendations  Home health OT    Assistance Recommended at Discharge Intermittent Supervision/Assistance  Functional Status Assessment  Patient has had a recent decline in their functional status and demonstrates the ability to make significant improvements in function in a reasonable and predictable amount of time.  Equipment Recommendations  None recommended by OT    Recommendations for Other Services       Precautions / Restrictions Precautions Precautions: Fall Precaution Comments: monitor O2 Restrictions Weight Bearing Restrictions: No      Mobility Bed Mobility Overal bed mobility: Modified Independent             General bed mobility comments: increased effort/time    Transfers Overall transfer level: Needs assistance Equipment used: Rolling walker (2 wheels) Transfers: Sit to/from Stand Sit to Stand: From elevated surface;Supervision           General transfer comment: elevated ED stretcher      Balance Overall balance assessment: Mild deficits observed, not formally tested                                         ADL either performed or assessed with clinical judgement   ADL  General ADL Comments: Pt able to perform LB ADL with PRN Min A, dons slip on shoes with setup, SBA-CGA for ADL transfers and mobility - dtr endorses pt appears near baseline     Vision         Perception     Praxis      Pertinent Vitals/Pain Pain Assessment: No/denies pain     Hand  Dominance Right   Extremity/Trunk Assessment Upper Extremity Assessment Upper Extremity Assessment: Overall WFL for tasks assessed   Lower Extremity Assessment Lower Extremity Assessment: Generalized weakness;Overall WFL for tasks assessed       Communication Communication Communication: HOH   Cognition Arousal/Alertness: Awake/alert Behavior During Therapy: WFL for tasks assessed/performed Overall Cognitive Status: Within Functional Limits for tasks assessed                                 General Comments: HOH, follows all commands     General Comments  Pt on 2L O2 at start of session, removed for mobility/ADL assessment, desats to mid 80's, endorses only very mild SOB, SpO2 improved to high 80's with rest, improves to mid 90's with 2L O2 reapplied    Exercises Other Exercises Other Exercises: Pt/dtr educated in energy conservation strategies to support ADL/IADL participation while minimizing risk of falls/over exertion/SOB   Shoulder Instructions      Home Living Family/patient expects to be discharged to:: Private residence Living Arrangements: Alone Available Help at Discharge: Family;Available 24 hours/day;Available PRN/intermittently (dtr confirms they can provide 24/7 at home) Type of Home: House Home Access: Stairs to enter Entrance Stairs-Number of Steps: 2 from garage Entrance Stairs-Rails: None (has things to hold on to but no rails) Home Layout: Two level;Able to live on main level with bedroom/bathroom;1/2 bath on main level Alternate Level Stairs-Number of Steps: has bed on main floor, doesn't go upstairs Alternate Level Stairs-Rails: None Bathroom Shower/Tub:  (doesn't go upstairs)   Bathroom Toilet: Handicapped height     Home Equipment: Conservation officer, nature (2 wheels);Other (comment);Adaptive equipment;Rollator (4 wheels) Adaptive Equipment: Reacher Additional Comments: toilet rails      Prior Functioning/Environment Prior Level of Function  : Needs assist             Mobility Comments: 2WW for mobility inside, rollator outside, no falls in past 52mo ADLs Comments: doesn't drive, dtr drove, indep with ADL, sink bath, has helper to clean house and "yard person", indep with light meal prep provided by children, indep with med mgt        OT Problem List: Decreased activity tolerance;Cardiopulmonary status limiting activity      OT Treatment/Interventions: Self-care/ADL training;Energy conservation;Patient/family education    OT Goals(Current goals can be found in the care plan section) Acute Rehab OT Goals Patient Stated Goal: breathe better and go home OT Goal Formulation: With patient/family Time For Goal Achievement: 07/26/21 Potential to Achieve Goals: Good ADL Goals Additional ADL Goal #1: Pt will perform all aspects of morning ADL routine with remote supervision, SpO2 >90% on room air throughout. Additional ADL Goal #2: Pt/family will verbalize plan to implement at least 2 learned energy conservation strategies to maximize safety/indep wtih ADL  OT Frequency: Min 2X/week   Barriers to D/C:            Co-evaluation              AM-PAC OT "6 Clicks" Daily Activity     Outcome Measure Help from  another person eating meals?: None Help from another person taking care of personal grooming?: None Help from another person toileting, which includes using toliet, bedpan, or urinal?: A Little Help from another person bathing (including washing, rinsing, drying)?: A Little Help from another person to put on and taking off regular upper body clothing?: None Help from another person to put on and taking off regular lower body clothing?: A Little 6 Click Score: 21   End of Session Equipment Utilized During Treatment: Rolling walker (2 wheels)  Activity Tolerance: Patient tolerated treatment well Patient left: in bed;with call bell/phone within reach;with family/visitor present  OT Visit Diagnosis: Other  abnormalities of gait and mobility (R26.89)                Time: 4462-8638 OT Time Calculation (min): 27 min Charges:  OT General Charges $OT Visit: 1 Visit OT Evaluation $OT Eval Low Complexity: 1 Low OT Treatments $Self Care/Home Management : 8-22 mins  Ardeth Perfect., MPH, MS, OTR/L ascom [phone] 07/12/21, 9:57 AM

## 2021-07-12 NOTE — Evaluation (Signed)
Physical Therapy Evaluation Patient Details Name: [name] [name] MRN: [id number] DOB: 30-Jun-1930 Today's Date: 07/12/2021  History of Present Illness  88 y.o. Caucasian female with medical history significant for asthma/COPD and hypertension, who presented to the ER with acute onset of worsening dyspnea over the last 3 days. COVID-19 + on 07/08/21.  Clinical Impression  Patient resting on stretcher upon arrival to ED room; alert and oriented to self, location and general situation. Easily confused to more complex information (in process of self-removing O2, finger monitor upon arrival to room); intermittent difficulty with recall and integration of new information.  Bilat UE/LE strength and ROM grossly symmetrical and WFL; no focal weakness appreciated.  Able to complete bed mobility with close sup; sit/stand, basic transfers and gait (60') with Rw, cga/close sup.  Demonstrates reciprocal stepping pattern with good step height/length; good walker position and management. No buckling or LOB. Mild SOB with desat to 85% on RA with exertion; reapplied supplemental O2 at 3L for recovery.  RN informed/aware. Would benefit from skilled PT to address above deficits and promote optimal return to PLOF.; Recommend transition to HHPT upon discharge from acute hospitalization.        Recommendations for follow up therapy are one component of a multi-disciplinary discharge planning process, led by the attending physician.  Recommendations may be updated based on patient status, additional functional criteria and insurance authorization.  Follow Up Recommendations Home health PT    Assistance Recommended at Discharge  (has necessary equipment)  Functional Status Assessment Patient has had a recent decline in their functional status and demonstrates the ability to make significant improvements in function in a reasonable and predictable amount of time.  Equipment Recommendations       Recommendations for  Other Services       Precautions / Restrictions Precautions Precautions: Fall Precaution Comments: monitor O2 Restrictions Weight Bearing Restrictions: No      Mobility  Bed Mobility Overal bed mobility: Modified Independent;Needs Assistance Bed Mobility: Supine to Sit;Sit to Supine     Supine to sit: Supervision Sit to supine: Supervision   General bed mobility comments: increased time/effort, but completes without physical assist from therapist    Transfers Overall transfer level: Needs assistance Equipment used: Rolling walker (2 wheels) Transfers: Sit to/from Stand Sit to Stand: Min guard;Supervision           General transfer comment: cuing for hand placement; tends to pull on RW    Ambulation/Gait Ambulation/Gait assistance: Min guard;Supervision Gait Distance (Feet): 60 Feet Assistive device: Rolling walker (2 wheels)         General Gait Details: reciprocal stepping pattern with good step height/length; good walker position and management. No buckling or LOB. Mild SOB with desat to 85% on RA with exertion; reapplied supplemental O2 at 3L for recovery  Stairs            Wheelchair Mobility    Modified Rankin (Stroke Patients Only)       Balance Overall balance assessment: Needs assistance Sitting-balance support: No upper extremity supported;Feet supported Sitting balance-Leahy Scale: Good     Standing balance support: Bilateral upper extremity supported Standing balance-Leahy Scale: Fair                               Pertinent Vitals/Pain Pain Assessment: No/denies pain    Home Living Family/patient expects to be discharged to:: Private residence Living Arrangements: Alone Available Help at Discharge: Family;Available 24  hours/day;Available PRN/intermittently Type of Home: House Home Access: Stairs to enter Entrance Stairs-Rails: None Entrance Stairs-Number of Steps: 2 from garage Alternate Level Stairs-Number of  Steps: has bed on main floor, doesn't go upstairs Home Layout: Two level;Able to live on main level with bedroom/bathroom;1/2 bath on main level Home Equipment: Rolling Walker (2 wheels);Other (comment);Adaptive equipment;Rollator (4 wheels) Additional Comments: toilet rails    Prior Function Prior Level of Function : Needs assist             Mobility Comments: SPC/RW for in-home mobility; rollator for outside of home. No home O2.  Denies recent fall history. ADLs Comments: doesn't drive, dtr drove, indep with ADL, sink bath, has helper to clean house and "yard person", indep with light meal prep provided by children, indep with med mgt     Hand Dominance   Dominant Hand: Right    Extremity/Trunk Assessment   Upper Extremity Assessment Upper Extremity Assessment: Overall WFL for tasks assessed    Lower Extremity Assessment Lower Extremity Assessment: Overall WFL for tasks assessed (grossly 4/5 throughout)       Communication   Communication: HOH  Cognition Arousal/Alertness: Awake/alert Behavior During Therapy: WFL for tasks assessed/performed Overall Cognitive Status: Within Functional Limits for tasks assessed                                 General Comments: HOH, follows all commands; limited recall and integration of new information        General Comments General comments (skin integrity, edema, etc.): Pt on 2L O2 at start of session, removed for mobility/ADL assessment, desats to mid 80's, endorses only very mild SOB, SpO2 improved to high 80's with rest, improves to mid 90's with 2L O2 reapplied    Exercises Other Exercises Other Exercises: Reviewed role of PT and progressive mobility; safety with transfers and gait; pursed lip breathing for respiratory support with activity.  Patient voiced understanding; will reinforce as needed.   Assessment/Plan    PT Assessment Patient needs continued PT services  PT Problem List Decreased activity  tolerance;Decreased balance;Decreased mobility;Decreased cognition;Decreased knowledge of use of DME;Decreased safety awareness;Cardiopulmonary status limiting activity;Decreased knowledge of precautions       PT Treatment Interventions DME instruction;Gait training;Balance training;Functional mobility training;Stair training;Therapeutic exercise;Therapeutic activities;Patient/family education;Cognitive remediation    PT Goals (Current goals can be found in the Care Plan section)  Acute Rehab PT Goals Patient Stated Goal: to return home PT Goal Formulation: With patient Time For Goal Achievement: 07/26/21 Potential to Achieve Goals: Good    Frequency Min 2X/week   Barriers to discharge        Co-evaluation               AM-PAC PT "6 Clicks" Mobility  Outcome Measure Help needed turning from your back to your side while in a flat bed without using bedrails?: None Help needed moving from lying on your back to sitting on the side of a flat bed without using bedrails?: None Help needed moving to and from a bed to a chair (including a wheelchair)?: A Little Help needed standing up from a chair using your arms (e.g., wheelchair or bedside chair)?: A Little Help needed to walk in hospital room?: A Little Help needed climbing 3-5 steps with a railing? : A Little 6 Click Score: 20    End of Session Equipment Utilized During Treatment: Oxygen Activity Tolerance: Patient tolerated treatment well Patient  left: in bed;with call bell/phone within reach Nurse Communication: Mobility status PT Visit Diagnosis: Muscle weakness (generalized) (M62.81);Difficulty in walking, not elsewhere classified (R26.2)    Time: 2694-8546 PT Time Calculation (min) (ACUTE ONLY): 17 min   Charges:   PT Evaluation $PT Eval Moderate Complexity: 1 Mod PT Treatments $Therapeutic Activity: 8-22 mins        Tieisha Darden H. Owens Shark, PT, DPT, NCS 07/12/21, 11:23 AM [phone]

## 2021-07-13 ENCOUNTER — Encounter: Payer: Self-pay | Admitting: Family Medicine

## 2021-07-13 DIAGNOSIS — U071 COVID-19: Secondary | ICD-10-CM | POA: Diagnosis not present

## 2021-07-13 DIAGNOSIS — J9601 Acute respiratory failure with hypoxia: Secondary | ICD-10-CM | POA: Diagnosis not present

## 2021-07-13 DIAGNOSIS — E43 Unspecified severe protein-calorie malnutrition: Secondary | ICD-10-CM | POA: Insufficient documentation

## 2021-07-13 DIAGNOSIS — D696 Thrombocytopenia, unspecified: Secondary | ICD-10-CM | POA: Diagnosis not present

## 2021-07-13 LAB — CBC WITH DIFFERENTIAL/PLATELET
Abs Immature Granulocytes: 0 10*3/uL (ref 0.00–0.07)
Basophils Absolute: 0 10*3/uL (ref 0.0–0.1)
Basophils Relative: 0 %
Eosinophils Absolute: 0 10*3/uL (ref 0.0–0.5)
Eosinophils Relative: 0 %
HCT: 35.5 % — ABNORMAL LOW (ref 36.0–46.0)
Hemoglobin: 12.2 g/dL (ref 12.0–15.0)
Immature Granulocytes: 0 %
Lymphocytes Relative: 43 %
Lymphs Abs: 0.4 10*3/uL — ABNORMAL LOW (ref 0.7–4.0)
MCH: 32 pg (ref 26.0–34.0)
MCHC: 34.4 g/dL (ref 30.0–36.0)
MCV: 93.2 fL (ref 80.0–100.0)
Monocytes Absolute: 0 10*3/uL — ABNORMAL LOW (ref 0.1–1.0)
Monocytes Relative: 3 %
Neutro Abs: 0.6 10*3/uL — ABNORMAL LOW (ref 1.7–7.7)
Neutrophils Relative %: 54 %
Platelets: 136 10*3/uL — ABNORMAL LOW (ref 150–400)
RBC: 3.81 MIL/uL — ABNORMAL LOW (ref 3.87–5.11)
RDW: 13 % (ref 11.5–15.5)
WBC: 1 10*3/uL — CL (ref 4.0–10.5)
nRBC: 0 % (ref 0.0–0.2)

## 2021-07-13 LAB — COMPREHENSIVE METABOLIC PANEL
ALT: 11 U/L (ref 0–44)
AST: 19 U/L (ref 15–41)
Albumin: 3.3 g/dL — ABNORMAL LOW (ref 3.5–5.0)
Alkaline Phosphatase: 50 U/L (ref 38–126)
Anion gap: 8 (ref 5–15)
BUN: 31 mg/dL — ABNORMAL HIGH (ref 8–23)
CO2: 29 mmol/L (ref 22–32)
Calcium: 8.3 mg/dL — ABNORMAL LOW (ref 8.9–10.3)
Chloride: 100 mmol/L (ref 98–111)
Creatinine, Ser: 1.15 mg/dL — ABNORMAL HIGH (ref 0.44–1.00)
GFR, Estimated: 46 mL/min — ABNORMAL LOW (ref >60)
Glucose, Bld: 131 mg/dL — ABNORMAL HIGH (ref 70–99)
Potassium: 3.8 mmol/L (ref 3.5–5.1)
Sodium: 137 mmol/L (ref 135–145)
Total Bilirubin: 0.5 mg/dL (ref 0.3–1.2)
Total Protein: 5.8 g/dL — ABNORMAL LOW (ref 6.5–8.1)

## 2021-07-13 LAB — C-REACTIVE PROTEIN: CRP: 13.7 mg/dL — ABNORMAL HIGH (ref <1.0)

## 2021-07-13 LAB — PATHOLOGIST SMEAR REVIEW

## 2021-07-13 LAB — FERRITIN: Ferritin: 219 ng/mL (ref 11–307)

## 2021-07-13 MED ORDER — ADULT MULTIVITAMIN W/MINERALS CH
1.0000 | ORAL_TABLET | Freq: Every day | ORAL | Status: DC
  Administered 2021-07-14 – 2021-07-28 (×8): 1 via ORAL
  Filled 2021-07-13 (×12): qty 1

## 2021-07-13 MED ORDER — BOOST / RESOURCE BREEZE PO LIQD CUSTOM
1.0000 | Freq: Three times a day (TID) | ORAL | Status: DC
  Administered 2021-07-13 – 2021-07-28 (×30): 1 via ORAL

## 2021-07-13 MED ORDER — BARICITINIB 2 MG PO TABS
4.0000 mg | ORAL_TABLET | Freq: Every day | ORAL | Status: DC
  Administered 2021-07-13 – 2021-07-15 (×3): 4 mg via ORAL
  Filled 2021-07-13 (×3): qty 2

## 2021-07-13 MED ORDER — FAMOTIDINE 20 MG PO TABS
20.0000 mg | ORAL_TABLET | Freq: Every day | ORAL | Status: DC
  Administered 2021-07-14 – 2021-07-28 (×14): 20 mg via ORAL
  Filled 2021-07-13 (×14): qty 1

## 2021-07-13 NOTE — Progress Notes (Signed)
Initial Nutrition Assessment  DOCUMENTATION CODES:   Severe malnutrition in context of chronic illness  INTERVENTION:   Boost Breeze po TID, each supplement provides 250 kcal and 9 grams of protein  Magic cup TID with meals, each supplement provides 290 kcal and 9 grams of protein  MVI po daily   Pt at high refeed risk; recommend monitor potassium, magnesium and phosphorus labs daily until stable  NUTRITION DIAGNOSIS:   Severe Malnutrition related to chronic illness (COPD) as evidenced by estimated needs.  GOAL:   Patient will meet greater than or equal to 90% of their needs  MONITOR:   PO intake, Supplement acceptance, Labs, Weight trends, Skin, I & O's  REASON FOR ASSESSMENT:   Consult Assessment of nutrition requirement/status  ASSESSMENT:   88 y/o female with h/o COPD, HTN, Barrett's esophagus, HLD and Afib who is admitted with COVID 19.  Met with pt and daughter in room today. Pt reports decreased appetite and oral intake at baseline and in hospital. Pt ate a few bites of lunch today. Pt reports that she does not like "milky" supplements but reports that she likes mixed berry Boost Breeze. RD will add supplements and MVI to help pt meet her estimated needs. Pt is likely at refeed risk. RD will also liberalize pt's diet. Per chart, pt is down 6lbs(6%) from her UBW; RD unsure how recently weight loss occurred.   Medications reviewed and include: vitamin C, D3, lovenox, pepcid, solu-medrol, zinc   Labs reviewed: K 3.8 wnl, BUN 31(H), creat 1.15(H) Wbc 1.0(L)  NUTRITION - FOCUSED PHYSICAL EXAM:  Flowsheet Row Most Recent Value  Orbital Region Moderate depletion  Upper Arm Region Severe depletion  Thoracic and Lumbar Region Severe depletion  Buccal Region Moderate depletion  Temple Region Moderate depletion  Clavicle Bone Region Severe depletion  Clavicle and Acromion Bone Region Severe depletion  Scapular Bone Region Severe depletion  Dorsal Hand Severe  depletion  Patellar Region Severe depletion  Anterior Thigh Region Severe depletion  Posterior Calf Region Severe depletion  Edema (RD Assessment) None  Hair Reviewed  Eyes Reviewed  Mouth Reviewed  Skin Reviewed  Nails Reviewed   Diet Order:   Diet Order             Diet regular Room service appropriate? Yes; Fluid consistency: Thin  Diet effective now                  EDUCATION NEEDS:   Education needs have been addressed  Skin:  Skin Assessment: Reviewed RN Assessment (ecchymosis)  Last BM:  PTA  Height:   Ht Readings from Last 1 Encounters:  08/10/2021 _0  (1.6 m)    Weight:   Wt Readings from Last 1 Encounters:  07/30/2021 45 kg    Ideal Body Weight:  52.3 kg  BMI:  Body mass index is 17.59 kg/m.  Estimated Nutritional Needs:   Kcal:  1300-1600kcal/day  Protein:  65-75g/day  Fluid:  1.2-1.4L/day  Koleen Distance MS, RD, LDN Please refer to [facility] for RD and/or RD on-call/weekend/after hours pager

## 2021-07-13 NOTE — Progress Notes (Signed)
PROGRESS NOTE   HPI was taken from Dr. Sidney Ace: [name] [name] is a 88 y.o. Caucasian female with medical history significant for asthma/COPD and hypertension, who presented to the ER with acute onset of worsening dyspnea over the last 3 days.  The patient had a positive COVID-19 test on Thursday.  She has not been eating or drinking for the last few days.  She was noted to have mild confusion in the ER.  No loss of taste or smell.  She did have nausea, vomiting and diarrhea per her daughter today.  No chest pain or palpitations.  Per EMS her pulse oximetry was 81% on room air.  She was placed on 10 L with nonrebreather and pulse oximetry was in the mid 90s.  She was also given 1 DuoNeb and 1 albuterol nebulizer on route to the hospital.  She admitted to dry cough without expectoration.  She denies any nausea or vomiting however admits to lower abdominal pain.  No chest pain or palpitations.     ED Course: Upon presentation to the emergency room respiratory rate was 34 and pulse oximetry 87% on room air with a pulse oximetry of 144/59.  Labs revealed a VBG with pH 7.4 and HCO3 31.  BMP was remarkable for hypokalemia hyponatremia and hypochloremia and CBC showed leukopenia of 2.1 and thrombocytopenia with otherwise normal levels.  Influenza antigens came back negative.  COVID-19 PCR came back positive. EKG as reviewed by me : EKG showed sinus tachycardia with a rate of 1 1 with LAD and LVH. Imaging: Chest x-ray showed opacity in the left midlung concerning for pneumonia, emphysema, aortic atherosclerosis and severe elevation of the left hemidiaphragm.  The patient was given 40 mg of IV Lasix, DuoNeb and albuterol.  She will be admitted to a medical telemetry bed for further evaluation and management.     [name] [name]  TJQ:[id number] DOB: 04-01-1930 DOA: 07/29/2021 PCP: Jerrol Banana., MD    Assessment & Plan:   Principal Problem:   Acute hypoxemic respiratory failure due to COVID-19  [facility])   Acute hypoxic respiratory failure: found to be 87% on RA. Likely secondary to [place]. Continue on supplemental oxygen and wean as tolerated   COVID19 pneumonia: continue on IV remdesivir, steroids, bronchodilators & encourage incentive spirometry   COPD exacerbation: continue on steroids, bronchodilators, & encourage incentive spirometry   Leukopenia: etiology unclear, possibly secondary to [place].   Hyponatremia: resolved   Hypokalemia: WNL today    HTN: will hold home dose of losartan if Cr continues to rise  Thrombocytopenia: etiology unclear. Will continue to monitor   Severe protein calorie malnutrition: continue w/ nutritional supplements    DVT prophylaxis: lovenox  Code Status: partial  Family Communication: discussed pt's care w/ pt's daughter, [name], and answered her questions  Disposition Plan: likely d/c home w/ HH   Level of care: Telemetry Medical  Status is: Inpatient  Remains inpatient appropriate because: severity of illness, still requiring supplemental oxygen      Consultants:    Procedures:   Antimicrobials:   Subjective: Pt c/o malaise and shortness of breath   Objective: Vitals:   07/12/21 2030 07/13/21 0236 07/13/21 0709 07/13/21 0718  BP: (!) 131/51 (!) 134/59 135/79 (!) 115/48  Pulse: 65 85 89 77  Resp: 17 19 17 17   Temp:   98.8 F (37.1 C) 98.8 F (37.1 C)  TempSrc:   Oral Oral  SpO2: 91% 97% 93% 96%  Weight:  Height:       No intake or output data in the 24 hours ending 07/13/21 0830  Filed Weights   07/28/2021 1706  Weight: 45 kg    Examination:  General exam: Appears comfortable. Frail appearing  Respiratory system: decreased breath sounds b/l  Cardiovascular system: S1/S2+. No rubs or clicks  Gastrointestinal system: Abd is soft, NT, ND & hypoactive bowel sounds  Central nervous system: Alert and oriented. Moves all extremities   Psychiatry: Judgement and insight appears normal. Flat mood and  affect   Data Reviewed: I have personally reviewed following labs and imaging studies  CBC: Recent Labs  Lab 08/07/2021 1723 07/12/21 0829 07/13/21 0706  WBC 2.1* 2.0* 1.0*  NEUTROABS 1.5* 1.0* 0.6*  HGB 12.2 11.1* 12.2  HCT 36.4 32.9* 35.5*  MCV 93.3 93.7 93.2  PLT 110* 105* 509*   Basic Metabolic Panel: Recent Labs  Lab 08/05/2021 1723 08/06/2021 2029 07/12/21 0829  NA 130*  --  134*  K 2.8*  --  3.0*  CL 93*  --  97*  CO2 28  --  28  GLUCOSE 122*  --  100*  BUN 19  --  23  CREATININE 1.10*  --  1.06*  CALCIUM 8.5*  --  7.8*  MG  --  1.3*  --    GFR: Estimated Creatinine Clearance: 25.6 mL/min (A) (by C-G formula based on SCr of 1.06 mg/dL (H)). Liver Function Tests: Recent Labs  Lab 07/12/21 0829  AST 16  ALT 12  ALKPHOS 48  BILITOT 0.5  PROT 5.5*  ALBUMIN 3.1*   No results for input(s): LIPASE, AMYLASE in the last 168 hours. No results for input(s): AMMONIA in the last 168 hours. Coagulation Profile: No results for input(s): INR, PROTIME in the last 168 hours. Cardiac Enzymes: No results for input(s): CKTOTAL, CKMB, CKMBINDEX, TROPONINI in the last 168 hours. BNP (last 3 results) No results for input(s): PROBNP in the last 8760 hours. HbA1C: No results for input(s): HGBA1C in the last 72 hours. CBG: No results for input(s): GLUCAP in the last 168 hours. Lipid Profile: No results for input(s): CHOL, HDL, LDLCALC, TRIG, CHOLHDL, LDLDIRECT in the last 72 hours. Thyroid Function Tests: No results for input(s): TSH, T4TOTAL, FREET4, T3FREE, THYROIDAB in the last 72 hours. Anemia Panel: Recent Labs    08/06/2021 2029 07/12/21 0829  FERRITIN 137 144   Sepsis Labs: Recent Labs  Lab 07/26/2021 2029  PROCALCITON 0.38  LATICACIDVEN 1.7    Recent Results (from the past 240 hour(s))  Resp Panel by RT-PCR (Flu A&B, Covid) Nasopharyngeal Swab     Status: Abnormal   Collection Time: 08/01/2021  5:23 PM   Specimen: Nasopharyngeal Swab; Nasopharyngeal(NP) swabs  in vial transport medium  Result Value Ref Range Status   SARS Coronavirus 2 by RT PCR POSITIVE (A) NEGATIVE Final    Comment: (NOTE) SARS-CoV-2 target nucleic acids are DETECTED.  The SARS-CoV-2 RNA is generally detectable in upper respiratory specimens during the acute phase of infection. Positive results are indicative of the presence of the identified virus, but do not rule out bacterial infection or co-infection with other pathogens not detected by the test. Clinical correlation with patient history and other diagnostic information is necessary to determine patient infection status. The expected result is Negative.  Fact Sheet for Patients: EntrepreneurPulse.com.au  Fact Sheet for Healthcare Providers: IncredibleEmployment.be  This test is not yet approved or cleared by the [place] FDA and  has been authorized for detection and/or  diagnosis of SARS-CoV-2 by FDA under an Emergency Use Authorization (EUA).  This EUA will remain in effect (meaning this test can be used) for the duration of  the COVID-19 declaration under Section 564(b)(1) of the A ct, 21 U.S.C. section 360bbb-3(b)(1), unless the authorization is terminated or revoked sooner.     Influenza A by PCR NEGATIVE NEGATIVE Final   Influenza B by PCR NEGATIVE NEGATIVE Final    Comment: (NOTE) The Xpert Xpress SARS-CoV-2/FLU/RSV plus assay is intended as an aid in the diagnosis of influenza from Nasopharyngeal swab specimens and should not be used as a sole basis for treatment. Nasal washings and aspirates are unacceptable for Xpert Xpress SARS-CoV-2/FLU/RSV testing.  Fact Sheet for Patients: EntrepreneurPulse.com.au  Fact Sheet for Healthcare Providers: IncredibleEmployment.be  This test is not yet approved or cleared by the [place] FDA and has been authorized for detection and/or diagnosis of SARS-CoV-2 by FDA under an Emergency  Use Authorization (EUA). This EUA will remain in effect (meaning this test can be used) for the duration of the COVID-19 declaration under Section 564(b)(1) of the Act, 21 U.S.C. section 360bbb-3(b)(1), unless the authorization is terminated or revoked.  Performed at [facility], [address], [place], [place] [address]          Radiology Studies: DG Chest [place] 1 View  Result Date: 07/26/2021 CLINICAL DATA:  88 year old female with history of shortness of breath over the past 3 days. EXAM: PORTABLE CHEST 1 VIEW COMPARISON:  Chest x-ray 02/03/2016. FINDINGS: Severe elevation of the left hemidiaphragm. New opacity in the periphery of the left mid lung. Emphysematous changes throughout the lungs bilaterally. Diffuse peribronchial cuffing. No definite pleural effusions. No pneumothorax. No evidence of pulmonary edema. Heart size is mildly enlarged. Upper mediastinal contours are within normal limits. Atherosclerotic calcifications are noted in the thoracic aorta. IMPRESSION: 1. Opacity in the left mid lung concerning for pneumonia. Alternatively, in the appropriate clinical setting, this could represent hemorrhage from pulmonary infarct. 2. Emphysema. 3. Aortic atherosclerosis. 4. Severe elevation of the left hemidiaphragm. Electronically Signed   By: Vinnie Langton M.D.   On: 07/15/2021 17:49        Scheduled Meds:  vitamin C  500 mg Oral Daily   baricitinib  2 mg Oral Daily   calcium-vitamin D  2 tablet Oral Daily   cholecalciferol  1,000 Units Oral Daily   enoxaparin (LOVENOX) injection  30 mg Subcutaneous Q24H   famotidine  20 mg Oral BID   guaiFENesin  600 mg Oral BID   Ipratropium-Albuterol  2 puff Inhalation QID   losartan  100 mg Oral Daily   methylPREDNISolone (SOLU-MEDROL) injection  1 mg/kg Intravenous Q12H   Followed by   Derrill Memo ON 07/14/2021] predniSONE  50 mg Oral Daily   potassium chloride  60 mEq Oral Once   zinc sulfate  220 mg Oral Daily   Continuous  Infusions:  remdesivir 100 mg in NS 100 mL Stopped (07/12/21 1243)     LOS: 2 days    Time spent: 25 mins     Wyvonnia Dusky, MD [facility] Pager [phone]  If 7PM-7AM, please contact night-coverage 07/13/2021, 8:30 AM

## 2021-07-13 NOTE — Progress Notes (Signed)
PHARMACY NOTE:  ANTIMICROBIAL RENAL DOSAGE ADJUSTMENT  Current antimicrobial regimen includes a mismatch between antimicrobial dosage and estimated renal function.  As per policy approved by the Pharmacy & Therapeutics and Medical Executive Committees, the antimicrobial dosage will be adjusted accordingly.  Current antimicrobial dosage:  Bariticinib 72m PO daily  Indication: COVID-19   Renal Function: eGFR 50 ml/min    Antimicrobial dosage has been changed to: Baricitinib 437mPO daily  Additional comments:   Thank you for allowing pharmacy to be a part of this patient's care.  AnDarrick PennaRPWinkler County Memorial Hospital/09/2021 9:20 AM

## 2021-07-13 NOTE — TOC Initial Note (Signed)
Transition of Care [facility]) - Initial/Assessment Note    Patient Details  Name: [name] [name] MRN: [id number] Date of Birth: [birth year]  Transition of [facility]) CM/SW Contact:    Beverly Sessions, RN Phone Number: 07/13/2021, 1:56 PM  Clinical Narrative:                 Due to covid isolation attempted to assess patient via phone.  She deferred me to her daughter [name]  Admitted for: respiratory due to covid Admitted from: Home alone PCP: Rosanna Randy- Daughter transports [facility] Current home health/prior home health/DME: [facility]  Therapy recommending home health.  Daughter in agreement and states she does not have a preference of home health agency. Referral made to [facility] with [place] for RN, PT and OT  2L acute O2   Expected Discharge Plan: [place] Barriers to Discharge: Continued Medical Work up   Patient Goals and CMS Choice     Choice offered to / list presented to : Adult Children  Expected Discharge Plan and Services Expected Discharge Plan: [place]   Discharge Planning Services: CM Consult Post Acute Care Choice: [place] arrangements for the past 2 months: Single Family Home                           HH Arranged: RN, PT [place] Agency: [place] ([facility]) Date HH Agency Contacted: 07/13/21   Representative spoke with at [place]: Corene Cornea  Prior Living Arrangements/Services Living arrangements for the past 2 months: [place] Lives with:: Self Patient language and need for interpreter reviewed:: Yes Do you feel safe going back to the place where you live?: Yes      Need for Family Participation in Patient Care: Yes (Comment) Care giver support system in place?: Yes (comment) Current home services: DME Criminal Activity/Legal Involvement Pertinent to Current Situation/Hospitalization: No - Comment as needed  Activities of Daily Living Home Assistive  Devices/Equipment: Walker (specify type), Reacher, Bedside commode/3-in-1 ADL Screening (condition at time of admission) Patient's cognitive ability adequate to safely complete daily activities?: Yes Is the patient deaf or have difficulty hearing?: Yes Does the patient have difficulty seeing, even when wearing glasses/contacts?: No Does the patient have difficulty concentrating, remembering, or making decisions?: No Patient able to express need for assistance with ADLs?: Yes Does the patient have difficulty dressing or bathing?: No Independently performs ADLs?: Yes (appropriate for developmental age) Does the patient have difficulty walking or climbing stairs?: Yes Weakness of Legs: None Weakness of Arms/Hands: None  Permission Sought/Granted                  Emotional Assessment       Orientation: : Oriented to Self, Oriented to Place, Oriented to  Time, Oriented to Situation Alcohol / Substance Use: Not Applicable Psych Involvement: No (comment)  Admission diagnosis:  SOB (shortness of breath) [R06.02] Dyspnea, unspecified type [R06.00] Community acquired pneumonia, unspecified laterality [J18.9] Acute hypoxemic respiratory failure due to COVID-19 ([place]) [U07.1, J96.01] COVID [U07.1] Patient Active Problem List   Diagnosis Date Noted   Acute hypoxemic respiratory failure due to COVID-19 ([place]) 08/06/2021   Pulmonary emphysema ([place]) 11/16/2015   Chronic obstructive pulmonary emphysema ([place]) 12/24/2014   Allergic rhinitis 12/23/2014   Back pain, chronic 12/23/2014   Barrett's esophagus with low grade dysplasia 12/23/2014   Aneurysm, cerebral 12/23/2014   CAFL (chronic airflow  limitation) ([place]) 12/23/2014   Cognitive decline 12/23/2014   Essential (primary) hypertension 12/23/2014   Fatigue 12/23/2014   Acid reflux 12/23/2014   H/O peptic ulcer 12/23/2014   History of biliary disease 12/23/2014   HLD (hyperlipidemia) 12/23/2014   Insomnia related to another mental  disorder 12/23/2014   Arthritis, degenerative 12/23/2014   OP (osteoporosis) 12/23/2014   Plantar fasciitis 12/23/2014   Hypercholesterolemia without hypertriglyceridemia 12/23/2014   Peptic ulcer 12/23/2014   Kidney cysts 12/23/2014   Fast heart beat 12/23/2014   Tubular adenoma 12/23/2014   Vertebral compression fracture ([place]) 12/23/2014   Avitaminosis D 12/23/2014   A-fib ([place]) 04/08/2014   Hypertension 08/06/2007   EMPHYSEMA 08/06/2007   ASTHMA 08/06/2007   COPD (chronic obstructive pulmonary disease) ([place]) 08/06/2007   PCP:  Jerrol Banana., MD Pharmacy:   [place], [place] - [address] [address] Phone: [phone] Fax: [phone]  [place], [place] - [address] [place] [address] Phone: [phone] Fax: [phone]     Social Determinants of Health (SDOH) Interventions    Readmission Risk Interventions No flowsheet data found.

## 2021-07-14 ENCOUNTER — Telehealth: Payer: Self-pay

## 2021-07-14 DIAGNOSIS — J9601 Acute respiratory failure with hypoxia: Secondary | ICD-10-CM | POA: Diagnosis not present

## 2021-07-14 DIAGNOSIS — U071 COVID-19: Secondary | ICD-10-CM | POA: Diagnosis not present

## 2021-07-14 LAB — CBC WITH DIFFERENTIAL/PLATELET
Abs Immature Granulocytes: 0 10*3/uL (ref 0.00–0.07)
Basophils Absolute: 0 10*3/uL (ref 0.0–0.1)
Basophils Relative: 0 %
Eosinophils Absolute: 0 10*3/uL (ref 0.0–0.5)
Eosinophils Relative: 0 %
HCT: 33.2 % — ABNORMAL LOW (ref 36.0–46.0)
Hemoglobin: 11.1 g/dL — ABNORMAL LOW (ref 12.0–15.0)
Immature Granulocytes: 0 %
Lymphocytes Relative: 60 %
Lymphs Abs: 0.9 10*3/uL (ref 0.7–4.0)
MCH: 30.7 pg (ref 26.0–34.0)
MCHC: 33.4 g/dL (ref 30.0–36.0)
MCV: 92 fL (ref 80.0–100.0)
Monocytes Absolute: 0 10*3/uL — ABNORMAL LOW (ref 0.1–1.0)
Monocytes Relative: 3 %
Neutro Abs: 0.6 10*3/uL — ABNORMAL LOW (ref 1.7–7.7)
Neutrophils Relative %: 37 %
Platelets: 153 10*3/uL (ref 150–400)
RBC: 3.61 MIL/uL — ABNORMAL LOW (ref 3.87–5.11)
RDW: 12.8 % (ref 11.5–15.5)
WBC: 1.5 10*3/uL — ABNORMAL LOW (ref 4.0–10.5)
nRBC: 0 % (ref 0.0–0.2)

## 2021-07-14 LAB — COMPREHENSIVE METABOLIC PANEL
ALT: 11 U/L (ref 0–44)
AST: 22 U/L (ref 15–41)
Albumin: 3.1 g/dL — ABNORMAL LOW (ref 3.5–5.0)
Alkaline Phosphatase: 44 U/L (ref 38–126)
Anion gap: 8 (ref 5–15)
BUN: 37 mg/dL — ABNORMAL HIGH (ref 8–23)
CO2: 27 mmol/L (ref 22–32)
Calcium: 8.9 mg/dL (ref 8.9–10.3)
Chloride: 99 mmol/L (ref 98–111)
Creatinine, Ser: 1.24 mg/dL — ABNORMAL HIGH (ref 0.44–1.00)
GFR, Estimated: 42 mL/min — ABNORMAL LOW (ref >60)
Glucose, Bld: 147 mg/dL — ABNORMAL HIGH (ref 70–99)
Potassium: 2.8 mmol/L — ABNORMAL LOW (ref 3.5–5.1)
Sodium: 134 mmol/L — ABNORMAL LOW (ref 135–145)
Total Bilirubin: 0.5 mg/dL (ref 0.3–1.2)
Total Protein: 5.6 g/dL — ABNORMAL LOW (ref 6.5–8.1)

## 2021-07-14 LAB — FERRITIN: Ferritin: 226 ng/mL (ref 11–307)

## 2021-07-14 LAB — C-REACTIVE PROTEIN: CRP: 8 mg/dL — ABNORMAL HIGH (ref <1.0)

## 2021-07-14 MED ORDER — LOSARTAN POTASSIUM 50 MG PO TABS: 100.0000 mg | ORAL_TABLET | Freq: Every day | ORAL | Status: DC

## 2021-07-14 MED ORDER — POTASSIUM CHLORIDE CRYS ER 20 MEQ PO TBCR
40.0000 meq | EXTENDED_RELEASE_TABLET | Freq: Three times a day (TID) | ORAL | Status: AC
  Administered 2021-07-14 (×3): 40 meq via ORAL
  Filled 2021-07-14 (×3): qty 2

## 2021-07-14 NOTE — Progress Notes (Signed)
Mobility Specialist - Progress Note   07/14/21 1300  Mobility  Activity Ambulated in room  Level of Assistance Standby assist, set-up cues, supervision of patient - no hands on  Assistive Device Front wheel walker  Distance Ambulated (ft) 20 ft  Mobility Ambulated with assistance in room  Mobility Response Tolerated well  Mobility performed by Mobility specialist  $Mobility charge 1 Mobility    Pre-mobility: 94% SpO2 During mobility: 87-90% SpO2 Post-mobility: 96% SpO2   Pt lying in bed upon arrival, utilizing RA---[name] sitting on chin with O2 in mid 90s. Pt agreeable to short ambulation in room. ModI for bed mobility. O2 desat to 87% with ambulation---reapplied [name] on 1L with sats improving to low 90s. Pt denied SOB and fatigue. Pt returned supine with alarm set, needs in reach.    [name] [name] Mobility Specialist 07/14/21, 1:22 PM

## 2021-07-14 NOTE — Telephone Encounter (Signed)
Patient currently admitted. Patient son calling for advice. Please review.

## 2021-07-14 NOTE — Progress Notes (Signed)
PROGRESS NOTE   HPI was taken from Dr. Sidney Ace: [name] [name] is a 88 y.o. Caucasian female with medical history significant for asthma/COPD and hypertension, who presented to the ER with acute onset of worsening dyspnea over the last 3 days.  The patient had a positive COVID-19 test on Thursday.  She has not been eating or drinking for the last few days.  She was noted to have mild confusion in the ER.  No loss of taste or smell.  She did have nausea, vomiting and diarrhea per her daughter today.  No chest pain or palpitations.  Per EMS her pulse oximetry was 81% on room air.  She was placed on 10 L with nonrebreather and pulse oximetry was in the mid 90s.  She was also given 1 DuoNeb and 1 albuterol nebulizer on route to the hospital.  She admitted to dry cough without expectoration.  She denies any nausea or vomiting however admits to lower abdominal pain.  No chest pain or palpitations.     ED Course: Upon presentation to the emergency room respiratory rate was 34 and pulse oximetry 87% on room air with a pulse oximetry of 144/59.  Labs revealed a VBG with pH 7.4 and HCO3 31.  BMP was remarkable for hypokalemia hyponatremia and hypochloremia and CBC showed leukopenia of 2.1 and thrombocytopenia with otherwise normal levels.  Influenza antigens came back negative.  COVID-19 PCR came back positive. EKG as reviewed by me : EKG showed sinus tachycardia with a rate of 1 1 with LAD and LVH. Imaging: Chest x-ray showed opacity in the left midlung concerning for pneumonia, emphysema, aortic atherosclerosis and severe elevation of the left hemidiaphragm.  The patient was given 40 mg of IV Lasix, DuoNeb and albuterol.  She was admitted to a medical telemetry bed for further evaluation and management.  07/14/2021: Patient was seen and examined at bedside.  She endorses mild cough.  She is on 1 L nasal cannula with O2 saturation of 97% denies any chest pain.   [name] [name]  JYN:[id number] DOB:  04/15/1930 DOA: 08/04/2021 PCP: Jerrol Banana., MD    Assessment & Plan:   Principal Problem:   Acute hypoxemic respiratory failure due to COVID-19 [facility]) Active Problems:   Protein-calorie malnutrition, severe   Acute hypoxic respiratory failure:  Initially found to be 87% on RA. Likely secondary to [place]. Continue on supplemental oxygen and wean as tolerated  Oxygen requirement is improving Maintain O2 saturation greater than 92% Home oxygen evaluation on 07/14/2021 for DC planning.  COVID19 pneumonia: Continue on IV remdesivir, steroids, bronchodilators & encourage incentive spirometry. She is also on baricitinib, continue to closely monitor  COPD exacerbation: Continue on steroids, bronchodilators, & encourage incentive spirometry  Maintain oxygen saturation greater than 92%.  Improving leukopenia:  WBC is uptrending 1.5 from 1.0.   Etiology unclear, possibly secondary to COVID19.   Mild hyponatremia: Sodium 134  Refractory hypokalemia: Serum potassium 2.8, replete as indicated.  HTN: Continue to closely monitor vital signs.  Resolved thrombocytopenia:  Suspect in the setting of sepsis, COVID-19 viral infection.  Severe protein calorie malnutrition:  Continue to encourage increase in oral protein calorie intake.  Continue w/ nutritional supplements    DVT prophylaxis: lovenox subcu daily Code Status: partial, no intubation Family Communication: discussed pt's care w/ pt's daughter, [name], and answered her questions  Disposition Plan: likely d/c home w/ [facility] likely on 07/15/2021.  Level of care: Telemetry Medical  Status is: Inpatient  Remains inpatient  appropriate because: severity of illness, still requiring supplemental oxygen      Consultants:  None.  Procedures:   Antimicrobials:   Objective: Vitals:   07/13/21 1120 07/13/21 1701 07/13/21 2130 07/14/21 0808  BP: 137/73 (!) 142/60 (!) 131/52 (!) 143/75  Pulse: 85 92 92 85  Resp:  18 20 18    Temp:  97.9 F (36.6 C) 97.9 F (36.6 C) 98.1 F (36.7 C)  TempSrc:      SpO2:  95% 94% 97%  Weight:      Height:        Intake/Output Summary (Last 24 hours) at 07/14/2021 1259 Last data filed at 07/13/2021 1501 Gross per 24 hour  Intake 199.56 ml  Output --  Net 199.56 ml    Filed Weights   07/14/2021 1706  Weight: 45 kg    Examination:  General exam: Frail-appearing no acute distress.  She is alert and oriented x3.  Very hard of hearing. Respiratory system: Mild rales at bases.  Mild wheezing noted bilaterally.  Good inspiratory effort. Cardiovascular system: Regular rate and rhythm no rubs or gallops.  No JVD noted.   Gastrointestinal system: Soft nontender normal bowel sounds present.   Central nervous system: Alert and oriented.  Moves all 4 extremities equally.   Psychiatry: Mood is appropriate for condition and setting.   Data Reviewed: I have personally reviewed following labs and imaging studies  CBC: Recent Labs  Lab 08/06/2021 1723 07/12/21 0829 07/13/21 0706 07/14/21 0341  WBC 2.1* 2.0* 1.0* 1.5*  NEUTROABS 1.5* 1.0* 0.6* 0.6*  HGB 12.2 11.1* 12.2 11.1*  HCT 36.4 32.9* 35.5* 33.2*  MCV 93.3 93.7 93.2 92.0  PLT 110* 105* 136* 027   Basic Metabolic Panel: Recent Labs  Lab 08/03/2021 1723 07/14/2021 2029 07/12/21 0829 07/13/21 0706 07/14/21 0341  NA 130*  --  134* 137 134*  K 2.8*  --  3.0* 3.8 2.8*  CL 93*  --  97* 100 99  CO2 28  --  28 29 27   GLUCOSE 122*  --  100* 131* 147*  BUN 19  --  23 31* 37*  CREATININE 1.10*  --  1.06* 1.15* 1.24*  CALCIUM 8.5*  --  7.8* 8.3* 8.9  MG  --  1.3*  --   --   --    GFR: Estimated Creatinine Clearance: 21.8 mL/min (A) (by C-G formula based on SCr of 1.24 mg/dL (H)). Liver Function Tests: Recent Labs  Lab 07/12/21 0829 07/13/21 0706 07/14/21 0341  AST 16 19 22   ALT 12 11 11   ALKPHOS 48 50 44  BILITOT 0.5 0.5 0.5  PROT 5.5* 5.8* 5.6*  ALBUMIN 3.1* 3.3* 3.1*   No results for input(s): LIPASE, AMYLASE  in the last 168 hours. No results for input(s): AMMONIA in the last 168 hours. Coagulation Profile: No results for input(s): INR, PROTIME in the last 168 hours. Cardiac Enzymes: No results for input(s): CKTOTAL, CKMB, CKMBINDEX, TROPONINI in the last 168 hours. BNP (last 3 results) No results for input(s): PROBNP in the last 8760 hours. HbA1C: No results for input(s): HGBA1C in the last 72 hours. CBG: No results for input(s): GLUCAP in the last 168 hours. Lipid Profile: No results for input(s): CHOL, HDL, LDLCALC, TRIG, CHOLHDL, LDLDIRECT in the last 72 hours. Thyroid Function Tests: No results for input(s): TSH, T4TOTAL, FREET4, T3FREE, THYROIDAB in the last 72 hours. Anemia Panel: Recent Labs    07/13/21 0706 07/14/21 0341  FERRITIN 219 226   Sepsis Labs:  Recent Labs  Lab 07/30/2021 2029  PROCALCITON 0.38  LATICACIDVEN 1.7    Recent Results (from the past 240 hour(s))  Resp Panel by RT-PCR (Flu A&B, Covid) Nasopharyngeal Swab     Status: Abnormal   Collection Time: 07/29/2021  5:23 PM   Specimen: Nasopharyngeal Swab; Nasopharyngeal(NP) swabs in vial transport medium  Result Value Ref Range Status   SARS Coronavirus 2 by RT PCR POSITIVE (A) NEGATIVE Final    Comment: (NOTE) SARS-CoV-2 target nucleic acids are DETECTED.  The SARS-CoV-2 RNA is generally detectable in upper respiratory specimens during the acute phase of infection. Positive results are indicative of the presence of the identified virus, but do not rule out bacterial infection or co-infection with other pathogens not detected by the test. Clinical correlation with patient history and other diagnostic information is necessary to determine patient infection status. The expected result is Negative.  Fact Sheet for Patients: EntrepreneurPulse.com.au  Fact Sheet for Healthcare Providers: IncredibleEmployment.be  This test is not yet approved or cleared by the [place]  FDA and  has been authorized for detection and/or diagnosis of SARS-CoV-2 by FDA under an Emergency Use Authorization (EUA).  This EUA will remain in effect (meaning this test can be used) for the duration of  the COVID-19 declaration under Section 564(b)(1) of the A ct, 21 U.S.C. section 360bbb-3(b)(1), unless the authorization is terminated or revoked sooner.     Influenza A by PCR NEGATIVE NEGATIVE Final   Influenza B by PCR NEGATIVE NEGATIVE Final    Comment: (NOTE) The Xpert Xpress SARS-CoV-2/FLU/RSV plus assay is intended as an aid in the diagnosis of influenza from Nasopharyngeal swab specimens and should not be used as a sole basis for treatment. Nasal washings and aspirates are unacceptable for Xpert Xpress SARS-CoV-2/FLU/RSV testing.  Fact Sheet for Patients: EntrepreneurPulse.com.au  Fact Sheet for Healthcare Providers: IncredibleEmployment.be  This test is not yet approved or cleared by the [place] FDA and has been authorized for detection and/or diagnosis of SARS-CoV-2 by FDA under an Emergency Use Authorization (EUA). This EUA will remain in effect (meaning this test can be used) for the duration of the COVID-19 declaration under Section 564(b)(1) of the Act, 21 U.S.C. section 360bbb-3(b)(1), unless the authorization is terminated or revoked.  Performed at [facility], [address], [place], [place] [address]          Radiology Studies: No results found.      Scheduled Meds:  vitamin C  500 mg Oral Daily   baricitinib  4 mg Oral Daily   calcium-vitamin D  2 tablet Oral Daily   cholecalciferol  1,000 Units Oral Daily   enoxaparin (LOVENOX) injection  30 mg Subcutaneous Q24H   famotidine  20 mg Oral Daily   feeding supplement  1 Container Oral TID BM   guaiFENesin  600 mg Oral BID   Ipratropium-Albuterol  2 puff Inhalation QID   [START ON 07/16/2021] losartan  100 mg Oral Daily   multivitamin  with minerals  1 tablet Oral Daily   potassium chloride  40 mEq Oral TID   predniSONE  50 mg Oral Daily   zinc sulfate  220 mg Oral Daily   Continuous Infusions:  remdesivir 100 mg in NS 100 mL 100 mg (07/14/21 1104)     LOS: 3 days    Time spent: 25 mins     Kayleen Memos, MD [facility] Pager [phone]  If 7PM-7AM, please contact night-coverage 07/14/2021, 12:59 PM

## 2021-07-14 NOTE — Telephone Encounter (Signed)
Copied from [place] [phone]. Topic: General - Other >> Jul 14, 2021  4:30 PM Erick Blinks wrote: Reason for CRM: Pt's son called to report that he is concerned about the patient. He says the pt's health is declining, he is worried she is not eating enough.  Best contact: [phone]

## 2021-07-15 ENCOUNTER — Inpatient Hospital Stay: Payer: Medicare Other

## 2021-07-15 ENCOUNTER — Encounter: Payer: Self-pay | Admitting: Family Medicine

## 2021-07-15 ENCOUNTER — Encounter: Payer: Self-pay | Admitting: *Deleted

## 2021-07-15 DIAGNOSIS — J9601 Acute respiratory failure with hypoxia: Secondary | ICD-10-CM | POA: Diagnosis not present

## 2021-07-15 DIAGNOSIS — U071 COVID-19: Secondary | ICD-10-CM | POA: Diagnosis not present

## 2021-07-15 LAB — CBC WITH DIFFERENTIAL/PLATELET
Abs Immature Granulocytes: 0.02 10*3/uL (ref 0.00–0.07)
Basophils Absolute: 0 10*3/uL (ref 0.0–0.1)
Basophils Relative: 0 %
Eosinophils Absolute: 0 10*3/uL (ref 0.0–0.5)
Eosinophils Relative: 0 %
HCT: 32.4 % — ABNORMAL LOW (ref 36.0–46.0)
Hemoglobin: 11.2 g/dL — ABNORMAL LOW (ref 12.0–15.0)
Immature Granulocytes: 1 %
Lymphocytes Relative: 36 %
Lymphs Abs: 0.6 10*3/uL — ABNORMAL LOW (ref 0.7–4.0)
MCH: 31 pg (ref 26.0–34.0)
MCHC: 34.6 g/dL (ref 30.0–36.0)
MCV: 89.8 fL (ref 80.0–100.0)
Monocytes Absolute: 0 10*3/uL — ABNORMAL LOW (ref 0.1–1.0)
Monocytes Relative: 2 %
Neutro Abs: 1 10*3/uL — ABNORMAL LOW (ref 1.7–7.7)
Neutrophils Relative %: 61 %
Platelets: 186 10*3/uL (ref 150–400)
RBC: 3.61 MIL/uL — ABNORMAL LOW (ref 3.87–5.11)
RDW: 12.5 % (ref 11.5–15.5)
WBC: 1.7 10*3/uL — ABNORMAL LOW (ref 4.0–10.5)
nRBC: 0 % (ref 0.0–0.2)

## 2021-07-15 LAB — BLOOD GAS, ARTERIAL
Acid-Base Excess: 3.5 mmol/L — ABNORMAL HIGH (ref 0.0–2.0)
Bicarbonate: 26.5 mmol/L (ref 20.0–28.0)
O2 Saturation: 95.3 %
Patient temperature: 37
pCO2 arterial: 34 mmHg (ref 32.0–48.0)
pH, Arterial: 7.5 — ABNORMAL HIGH (ref 7.350–7.450)
pO2, Arterial: 70 mmHg — ABNORMAL LOW (ref 83.0–108.0)

## 2021-07-15 LAB — BASIC METABOLIC PANEL
Anion gap: 8 (ref 5–15)
BUN: 43 mg/dL — ABNORMAL HIGH (ref 8–23)
CO2: 28 mmol/L (ref 22–32)
Calcium: 9.5 mg/dL (ref 8.9–10.3)
Chloride: 99 mmol/L (ref 98–111)
Creatinine, Ser: 1.23 mg/dL — ABNORMAL HIGH (ref 0.44–1.00)
GFR, Estimated: 42 mL/min — ABNORMAL LOW (ref >60)
Glucose, Bld: 137 mg/dL — ABNORMAL HIGH (ref 70–99)
Potassium: 3.6 mmol/L (ref 3.5–5.1)
Sodium: 135 mmol/L (ref 135–145)

## 2021-07-15 LAB — COMPREHENSIVE METABOLIC PANEL
ALT: 14 U/L (ref 0–44)
AST: 26 U/L (ref 15–41)
Albumin: 3.3 g/dL — ABNORMAL LOW (ref 3.5–5.0)
Alkaline Phosphatase: 46 U/L (ref 38–126)
Anion gap: 10 (ref 5–15)
BUN: 44 mg/dL — ABNORMAL HIGH (ref 8–23)
CO2: 27 mmol/L (ref 22–32)
Calcium: 9 mg/dL (ref 8.9–10.3)
Chloride: 100 mmol/L (ref 98–111)
Creatinine, Ser: 1.34 mg/dL — ABNORMAL HIGH (ref 0.44–1.00)
GFR, Estimated: 38 mL/min — ABNORMAL LOW (ref >60)
Glucose, Bld: 125 mg/dL — ABNORMAL HIGH (ref 70–99)
Potassium: 3.4 mmol/L — ABNORMAL LOW (ref 3.5–5.1)
Sodium: 137 mmol/L (ref 135–145)
Total Bilirubin: 0.6 mg/dL (ref 0.3–1.2)
Total Protein: 5.6 g/dL — ABNORMAL LOW (ref 6.5–8.1)

## 2021-07-15 LAB — CBC
HCT: 36.1 % (ref 36.0–46.0)
Hemoglobin: 12.5 g/dL (ref 12.0–15.0)
MCH: 31.4 pg (ref 26.0–34.0)
MCHC: 34.6 g/dL (ref 30.0–36.0)
MCV: 90.7 fL (ref 80.0–100.0)
Platelets: 203 10*3/uL (ref 150–400)
RBC: 3.98 MIL/uL (ref 3.87–5.11)
RDW: 12.9 % (ref 11.5–15.5)
WBC: 5.6 10*3/uL (ref 4.0–10.5)
nRBC: 0 % (ref 0.0–0.2)

## 2021-07-15 LAB — C-REACTIVE PROTEIN: CRP: 5.2 mg/dL — ABNORMAL HIGH (ref <1.0)

## 2021-07-15 LAB — FERRITIN: Ferritin: 205 ng/mL (ref 11–307)

## 2021-07-15 LAB — D-DIMER, QUANTITATIVE: D-Dimer, Quant: 1.05 ug/mL-FEU — ABNORMAL HIGH (ref 0.00–0.50)

## 2021-07-15 MED ORDER — BENZONATATE 100 MG PO CAPS
200.0000 mg | ORAL_CAPSULE | Freq: Three times a day (TID) | ORAL | Status: AC
  Administered 2021-07-15 – 2021-07-17 (×7): 200 mg via ORAL
  Filled 2021-07-15 (×7): qty 2

## 2021-07-15 MED ORDER — AMLODIPINE BESYLATE 5 MG PO TABS
5.0000 mg | ORAL_TABLET | Freq: Every day | ORAL | Status: DC
  Administered 2021-07-15 – 2021-07-19 (×5): 5 mg via ORAL
  Filled 2021-07-15 (×5): qty 1

## 2021-07-15 MED ORDER — POTASSIUM CHLORIDE CRYS ER 20 MEQ PO TBCR
40.0000 meq | EXTENDED_RELEASE_TABLET | Freq: Once | ORAL | Status: AC
  Administered 2021-07-15: 40 meq via ORAL
  Filled 2021-07-15: qty 2

## 2021-07-15 NOTE — Telephone Encounter (Signed)
This encounter was created in error - please disregard.

## 2021-07-15 NOTE — Consult Note (Signed)
PULMONOLOGY         Date: 07/16/2021,   MRN# [id number] [name] [name] [birth year]     AdmissionWeight: 45 kg                 CurrentWeight: 45 kg   Referring physician: Dr Nevada Crane   CHIEF COMPLAINT:   Lung mass   HISTORY OF PRESENT ILLNESS   This is a 88 yo F with hx of asthma cataracts and emphysema with COPD stage 2 as well as HTN. She was hopstialized due to progressive dyspnea and noted to have lung mass on chest CT. There is a 4mc spiculated mass of left upper lobe. She is with advanced age and is currently postive with acute covid19 infection. We will discussed performance of biopsy for cancer diagnosis after COVID treatment is over.    PAST MEDICAL HISTORY   Past Medical History:  Diagnosis Date   Asthma    Cataract    COPD (chronic obstructive pulmonary disease) ([place])    -FeV1 74% DLCO 56%   Emphysema    HTN (hypertension)      SURGICAL HISTORY   Past Surgical History:  Procedure Laterality Date   ankle surgery with implants Left 1994   Bening Tumor Removal from Thyroid   1950's   EYE SURGERY  2014   catarac   HERNIA REPAIR  1960's   Lacrimal gland surgery  2000   lumbar spine fracture and ankle fracture     MASS EXCISION N/A 02/15/2021   Procedure: TRANSANAL MASS EXCISION;  Surgeon: Robert Bellow, MD;  Location: [facility] ORS;  Service: General;  Laterality: N/A;  transanal   Right hip surgery with implants   1996   and LEFT Hip   status post nasal miacalcin     TONSILLECTOMY AND ADENOIDECTOMY  1950   TOTAL HIP ARTHROPLASTY Bilateral      FAMILY HISTORY   Family History  Problem Relation Age of Onset   Ovarian cancer Mother    Heart attack Father    Heart disease Brother    Asthma Other      SOCIAL HISTORY   Social History   Tobacco Use   Smoking status: Former    Types: Cigarettes    Quit date: 07/11/2002    Years since quitting: 19.0   Smokeless tobacco: Never  Vaping Use   Vaping Use: Never used  Substance Use Topics    Alcohol use: Yes    Alcohol/week: 1.0 standard drink    Types: 1 Glasses of wine per week    Comment: occasionally   Drug use: No     MEDICATIONS    Home Medication:    Current Medication:  Current Facility-Administered Medications:    acetaminophen (TYLENOL) tablet 650 mg, 650 mg, Oral, Q6H PRN, Mansy, Jan A, MD   amLODipine (NORVASC) tablet 5 mg, 5 mg, Oral, Daily, Hall, Carole N, DO, 5 mg at 07/16/21 2482   ascorbic acid (VITAMIN C) tablet 500 mg, 500 mg, Oral, Daily, Mansy, Jan A, MD, 500 mg at 07/16/21 5003   benzonatate (TESSALON) capsule 100 mg, 100 mg, Oral, TID PRN, Mansy, Jan A, MD   benzonatate (TESSALON) capsule 200 mg, 200 mg, Oral, TID, Hall, Carole N, DO, 200 mg at 07/15/21 1614   calcium-vitamin D (OSCAL WITH D) 500-5 MG-MCG per tablet 2 tablet, 2 tablet, Oral, Daily, Mansy, Jan A, MD, 2 tablet at 07/16/21 0948   chlorpheniramine-HYDROcodone (TUSSIONEX) 10-8 MG/5ML suspension 5 mL, 5 mL,  Oral, Q12H PRN, Mansy, Jan A, MD, 5 mL at 07/15/21 2306   cholecalciferol (VITAMIN D3) tablet 1,000 Units, 1,000 Units, Oral, Daily, Mansy, Jan A, MD, 1,000 Units at 07/16/21 0947   enoxaparin (LOVENOX) injection 30 mg, 30 mg, Subcutaneous, Q24H, Vira Blanco, RPH, 30 mg at 07/15/21 2302   famotidine (PEPCID) tablet 20 mg, 20 mg, Oral, Daily, Darrick Penna, RPH, 20 mg at 07/16/21 0109   feeding supplement (BOOST / RESOURCE BREEZE) liquid 1 Container, 1 Container, Oral, TID BM, Mansy, Jan A, MD, 1 Container at 07/16/21 1418   guaiFENesin (MUCINEX) 12 hr tablet 600 mg, 600 mg, Oral, BID, Mansy, Jan A, MD, 600 mg at 07/15/21 1003   guaiFENesin-dextromethorphan (ROBITUSSIN DM) 100-10 MG/5ML syrup 10 mL, 10 mL, Oral, Q4H PRN, Mansy, Arvella Merles, MD   Ipratropium-Albuterol (COMBIVENT) respimat 2 puff, 2 puff, Inhalation, QID, Mansy, Jan A, MD, 2 puff at 07/16/21 1723   magnesium hydroxide (MILK OF MAGNESIA) suspension 30 mL, 30 mL, Oral, Daily PRN, Mansy, Jan A, MD   melatonin tablet 2.5  mg, 2.5 mg, Oral, QHS, Hall, Carole N, DO   multivitamin with minerals tablet 1 tablet, 1 tablet, Oral, Daily, Mansy, Jan A, MD, 1 tablet at 07/15/21 1004   ondansetron (ZOFRAN) tablet 4 mg, 4 mg, Oral, Q6H PRN, 4 mg at 07/16/21 1010 **OR** ondansetron (ZOFRAN) injection 4 mg, 4 mg, Intravenous, Q6H PRN, Mansy, Jan A, MD   zinc sulfate capsule 220 mg, 220 mg, Oral, Daily, Mansy, Jan A, MD, 220 mg at 07/15/21 1003    ALLERGIES   Ciprofloxacin, Macrobid  [nitrofurantoin], Metoprolol, Sulfa antibiotics, and Sulfonamide derivatives     REVIEW OF SYSTEMS    Review of Systems:  Gen:  Denies  fever, sweats, chills weigh loss  HEENT: Denies blurred vision, double vision, ear pain, eye pain, hearing loss, nose bleeds, sore throat Cardiac:  No dizziness, chest pain or heaviness, chest tightness,edema Resp:   Denies cough or sputum porduction, shortness of breath,wheezing, hemoptysis,  Gi: Denies swallowing difficulty, stomach pain, nausea or vomiting, diarrhea, constipation, bowel incontinence Gu:  Denies bladder incontinence, burning urine Ext:   Denies Joint pain, stiffness or swelling Skin: Denies  skin rash, easy bruising or bleeding or hives Endoc:  Denies polyuria, polydipsia , polyphagia or weight change Psych:   Denies depression, insomnia or hallucinations   Other:  All other systems negative   VS: BP 136/68 (BP Location: Right Arm)    Pulse (!) 106    Temp 98 F (36.7 C)    Resp 18    Ht 5\' 3"  (1.6 m)    Wt 45 kg    SpO2 92%    BMI 17.59 kg/m      PHYSICAL EXAM    GENERAL:NAD, no fevers, chills, no weakness no fatigue HEAD: Normocephalic, atraumatic.  EYES: Pupils equal, round, reactive to light. Extraocular muscles intact. No scleral icterus.  MOUTH: Moist mucosal membrane. Dentition intact. No abscess noted.  EAR, NOSE, THROAT: Clear without exudates. No external lesions.  NECK: Supple. No thyromegaly. No nodules. No JVD.  PULMONARY: mild rhonchi bilaterally   CARDIOVASCULAR: S1 and S2. Regular rate and rhythm. No murmurs, rubs, or gallops. No edema. Pedal pulses 2+ bilaterally.  GASTROINTESTINAL: Soft, nontender, nondistended. No masses. Positive bowel sounds. No hepatosplenomegaly.  MUSCULOSKELETAL: No swelling, clubbing, or edema. Range of motion full in all extremities.  NEUROLOGIC: Cranial nerves II through XII are intact. No gross focal neurological deficits. Sensation intact. Reflexes intact.  SKIN:  No ulceration, lesions, rashes, or cyanosis. Skin warm and dry. Turgor intact.  PSYCHIATRIC: Mood, affect within normal limits. The patient is awake, alert and oriented x 3. Insight, judgment intact.       IMAGING    CT HEAD WO CONTRAST (5MM)  Result Date: 07/16/2021 CLINICAL DATA:  Delirium EXAM: CT HEAD WITHOUT CONTRAST TECHNIQUE: Contiguous axial images were obtained from the base of the skull through the vertex without intravenous contrast. COMPARISON:  None. FINDINGS: Brain: No evidence of acute infarction, hemorrhage, cerebral edema, mass, mass effect, or midline shift. Arachnoid cyst anterior to the left temporal lobe. No hydrocephalus or acute extra-axial fluid collection. Vascular: No hyperdense vessel. Skull: Normal. Negative for fracture or focal lesion. Sinuses/Orbits: No acute finding. Status post bilateral lens replacements. Other: The mastoid air cells are well aerated. IMPRESSION: IMPRESSION No acute intracranial process. Electronically Signed   By: Merilyn Baba M.D.   On: 07/16/2021 11:44   CT CHEST WO CONTRAST  Result Date: 07/15/2021 CLINICAL DATA:  Dyspnea.  Positive COVID-19. EXAM: CT CHEST WITHOUT CONTRAST TECHNIQUE: Multidetector CT imaging of the chest was performed following the standard protocol without IV contrast. COMPARISON:  October 01, 2012. FINDINGS: Cardiovascular: Atherosclerosis of thoracic aorta is noted without aneurysm formation. Mild cardiomegaly is noted. No pericardial effusion is noted. Coronary calcifications  are noted. Mediastinum/Nodes: No enlarged mediastinal or axillary lymph nodes. Thyroid gland, trachea, and esophagus demonstrate no significant findings. Lungs/Pleura: Elevated left hemidiaphragm is noted. No pneumothorax or pleural effusion is noted. 3.4 x 2.9 cm spiculated mass is noted laterally in the left upper lobe consistent with malignancy. Minimal right posterior basilar subsegmental atelectasis is noted. Upper Abdomen: No acute abnormality. Musculoskeletal: Probable old lower thoracic vertebral body fracture is noted. No definite acute abnormality is noted. IMPRESSION: 3.4 x 2.9 cm spiculated mass is noted in left upper lobe consistent with malignancy. PET scan is recommended for further evaluation. Elevated left hemidiaphragm is noted. Aortic Atherosclerosis (ICD10-I70.0). Electronically Signed   By: Marijo Conception M.D.   On: 07/15/2021 15:18   US Venous Img Lower Bilateral (DVT)  Result Date: 07/15/2021 CLINICAL DATA:  Malignancy, elevated D-dimer, COVID positive EXAM: BILATERAL LOWER EXTREMITY VENOUS DOPPLER ULTRASOUND TECHNIQUE: Gray-scale sonography with compression, as well as color and duplex ultrasound, were performed to evaluate the deep venous system(s) from the level of the common femoral vein through the popliteal and proximal calf veins. COMPARISON:  None. FINDINGS: VENOUS Normal compressibility of the common femoral, superficial femoral, and popliteal veins, as well as the visualized calf veins. Visualized portions of profunda femoral vein and great saphenous vein unremarkable. No filling defects to suggest DVT on grayscale or color Doppler imaging. Doppler waveforms show normal direction of venous flow, normal respiratory plasticity and response to augmentation. OTHER None. Limitations: none IMPRESSION: 1. No evidence of deep venous thrombosis within either lower extremity. Electronically Signed   By: Randa Ngo M.D.   On: 07/15/2021 19:43   DG Chest Port 1 View  Result Date:  07/15/2021 CLINICAL DATA:  Short of breath.  History of COPD. EXAM: PORTABLE CHEST 1 VIEW COMPARISON:  07/31/2021 FINDINGS: Marked elevation left hemidiaphragm unchanged. Density in the left lateral lung base unchanged. This could be pleural or parenchymal in nature. This was not present in 2017. Pulmonary hyperinflation. Negative for heart failure. Atherosclerotic aortic arch. Right lung clear. No significant pleural effusion. IMPRESSION: No interval change. Elevated left hemidiaphragm with density in the left lateral lung base. This could be pleural based or parenchymal. Nonspecific  appearance with differential including pneumonia, pleural thickening, and tumor. Recommend CT chest, preferably with intravenous contrast. Electronically Signed   By: Franchot Gallo M.D.   On: 07/15/2021 10:03   DG Chest Port 1 View  Result Date: 07/31/2021 CLINICAL DATA:  88 year old female with history of shortness of breath over the past 3 days. EXAM: PORTABLE CHEST 1 VIEW COMPARISON:  Chest x-ray 02/03/2016. FINDINGS: Severe elevation of the left hemidiaphragm. New opacity in the periphery of the left mid lung. Emphysematous changes throughout the lungs bilaterally. Diffuse peribronchial cuffing. No definite pleural effusions. No pneumothorax. No evidence of pulmonary edema. Heart size is mildly enlarged. Upper mediastinal contours are within normal limits. Atherosclerotic calcifications are noted in the thoracic aorta. IMPRESSION: 1. Opacity in the left mid lung concerning for pneumonia. Alternatively, in the appropriate clinical setting, this could represent hemorrhage from pulmonary infarct. 2. Emphysema. 3. Aortic atherosclerosis. 4. Severe elevation of the left hemidiaphragm. Electronically Signed   By: Vinnie Langton M.D.   On: 07/30/2021 17:49      ASSESSMENT/PLAN    Acute COVID19 pneumonia -Remdesevir antiviral - pharmacy protocol 5 d -vitamin C -zinc -decadron 6mg  IV daily  -Diuresis - Lasix 40 IV daily -  monitor UOP - utilize external urinary catheter if possible -Self prone if patient can tolerate  -encourage to use IS and Acapella device for bronchopulmonary hygiene when able -consider Actemra if CRP increments >20 -d/c hepatotoxic medications while on remdesevir -supportive care with ICU telemetry monitoring -PT/OT when possible -procalcitonin, CRP and ferritin trending  3cm left upper lung mass   - bronchoscopy with lung biopsy when patient in chronic stable state if this is in line with her goals of care  - currently she is fighting pneumonia with COVID so there is nothing to do for lung mass at this moment.   Bibasilar atelectasis  Encourage use of IS and PT/OT    Thank you for allowing me to participate in the care of this patient.  Total face to face encounter time for this patient visit was >45 min. >50% of the time was  spent in counseling and coordination of care.   Patient/Family are satisfied with care plan and all questions have been answered.  This document was prepared using Dragon voice recognition software and may include unintentional dictation errors.     Ottie Glazier, M.D.  Division of [place]

## 2021-07-15 NOTE — TOC Progression Note (Signed)
Transition of Care [facility]) - Progression Note    Patient Details  Name: [name] [name] MRN: [id number] Date of Birth: 1930-06-07  Transition of Care [facility]) CM/SW Contact  Beverly Sessions, RN Phone Number: 07/15/2021, 12:56 PM  Clinical Narrative:    Patient qualified for 1 L of O2 yesterday.  Anticipated DC tomorrow.  Per MD sats will be checked again to determine if patient will need O2 at discharge    Expected Discharge Plan: [place] Barriers to Discharge: Continued Medical Work up  Expected Discharge Plan and Services Expected Discharge Plan: [name]   Discharge Planning Services: CM Consult Post Acute Care Choice: [place] arrangements for the past 2 months: [place]: RN, PT [facility] Agency: [place] ([facility]) Date HH Agency Contacted: 07/13/21   Representative spoke with at [place]: [place] ([place]) Interventions    Readmission Risk Interventions No flowsheet data found.

## 2021-07-15 NOTE — Progress Notes (Signed)
Physical Therapy Treatment Patient Details Name: [name] [name] MRN: [id number] DOB: 07-Sep-1929 Today's Date: 07/15/2021   History of Present Illness [name] [name] is an 45yoF PMH: asthma, COPD, HOH, HTN, comes to ED with acute onset of worsening dyspnea over the last 3 days. COVID-19 + on 07/08/21.    PT Comments    Pt in bed upon entry, awake, pleasant. Pt has doffed her nasal cannula at some point, as well as all of her telemetry electrodes and O2 finger sensor- RN later reports pt has removed tele electrodes multiple times today. Pt 90% on room air. AMB on room air drops to 81%, slow recovery on 3L, eventually bumped to 4L. Pt AMB on 4L with terminal sats at 85%.Pt unable to say how long O2 has been off, who doffed it, or whether she is supposed to have it on. Pt reports she has been AMB to BR on her own, unclear validity here. Pt returned to bed at EOS, all needs met. Author unable to arm bed due to malfunction, RN made aware. Pt unable to advance AMB distance this date, has similar desaturation and O2 needs with mobility as compared to 2 days prior. Will continue to follow.    Recommendations for follow up therapy are one component of a multi-disciplinary discharge planning process, led by the attending physician.  Recommendations may be updated based on patient status, additional functional criteria and insurance authorization.  Follow Up Recommendations  Home health PT (will need 24/7 supervision for appropriate and safe DC to home)     Assistance Recommended at Discharge Frequent or constant Supervision/Assistance  Patient can return home with the following A little help with walking and/or transfers;A little help with bathing/dressing/bathroom;Assistance with cooking/housework;Direct supervision/assist for medications management;Direct supervision/assist for financial management;Assist for transportation;Help with stairs or ramp for entrance   Equipment Recommendations  None  recommended by PT    Recommendations for Other Services       Precautions / Restrictions Precautions Precautions: Fall Precaution Comments: monitor O2     Mobility  Bed Mobility Overal bed mobility: Needs Assistance       Supine to sit: Min guard;HOB elevated Sit to supine: Min guard;HOB elevated   General bed mobility comments: unable to scoot up toward [facility]    Transfers Overall transfer level: Needs assistance Equipment used: Rolling walker (2 wheels) Transfers: Sit to/from Stand Sit to Stand: Min guard           General transfer comment: multiple times in session, has 1 LOB backwards    Ambulation/Gait Ambulation/Gait assistance: Min guard Gait Distance (Feet): 60 Feet Assistive device: Rolling walker (2 wheels) Gait Pattern/deviations: Step-through pattern       General Gait Details: room air for 83ft arrested by severe ABD pain, sats at 81%, returned to 4L for full recovery, then 79ft AMB on 4L, no pain this time, but desat to 85%. (pt refuses to walk farther due to gut instinct, but cognitive impairment precludes self awareness enough to describe symptomatic limitations.)   Stairs             Wheelchair Mobility    Modified Rankin (Stroke Patients Only)       Balance Overall balance assessment: Needs assistance                                          Cognition Arousal/Alertness: Awake/alert Behavior During Therapy:  WFL for tasks assessed/performed Overall Cognitive Status: History of cognitive impairments - at baseline                                 General Comments: HOH, follow commands, is pleasant and interactive, has clear ST memory difficulty, RN reports she has doffed her O2 and tele leads multiple times this date.        Exercises      General Comments        Pertinent Vitals/Pain Pain Assessment: Faces Faces Pain Scale: Hurts even more Pain Location: ABD pain while up first time and  while AMB, none 2nd time; asked at EOS and pt has not recollection of having pain ever. Pain Intervention(s): Limited activity within patient's tolerance;Monitored during session    Home Living                          Prior Function            PT Goals (current goals can now be found in the care plan section) Acute Rehab PT Goals Patient Stated Goal: to return home PT Goal Formulation: With patient Time For Goal Achievement: 07/26/21 Potential to Achieve Goals: Good Progress towards PT goals: Not progressing toward goals - comment    Frequency    Min 2X/week      PT Plan Current plan remains appropriate    Co-evaluation              AM-PAC PT "6 Clicks" Mobility   Outcome Measure  Help needed turning from your back to your side while in a flat bed without using bedrails?: A Little Help needed moving from lying on your back to sitting on the side of a flat bed without using bedrails?: A Little Help needed moving to and from a bed to a chair (including a wheelchair)?: A Little Help needed standing up from a chair using your arms (e.g., wheelchair or bedside chair)?: A Little Help needed to walk in hospital room?: A Little Help needed climbing 3-5 steps with a railing? : A Little 6 Click Score: 18    End of Session Equipment Utilized During Treatment: Oxygen;Gait belt Activity Tolerance: Patient tolerated treatment well;No increased pain Patient left: in bed;with call bell/phone within reach (bed alarm not working, Therapist, sports made aware) Nurse Communication: Mobility status PT Visit Diagnosis: Muscle weakness (generalized) (M62.81);Difficulty in walking, not elsewhere classified (R26.2)     Time: 5366-4403 PT Time Calculation (min) (ACUTE ONLY): 25 min  Charges:  $Therapeutic Activity: 23-37 mins                    3:38 PM, 07/15/21 [name] [name], PT, DPT Physical Therapist - [facility]  [phone] ([place])      [name] [name] 07/15/2021, 3:33 PM

## 2021-07-15 NOTE — Telephone Encounter (Signed)
Left message for son to call back okay for [facility] nurse triage to advise of Daneil Dan message below, [employer]

## 2021-07-15 NOTE — Progress Notes (Addendum)
PROGRESS NOTE   HPI was taken from Dr. Sidney Ace: [name] [name] is a 88 y.o. Caucasian female with medical history significant for asthma/COPD and hypertension, who presented to the ER with acute onset of worsening dyspnea over the last 3 days.  The patient had a positive COVID-19 test on Thursday.  She has not been eating or drinking for the last few days.  She was noted to have mild confusion in the ER.  No loss of taste or smell.  She did have nausea, vomiting and diarrhea per her daughter today.  No chest pain or palpitations.  Per EMS her pulse oximetry was 81% on room air.  She was placed on 10 L with nonrebreather and pulse oximetry was in the mid 90s.  She was also given 1 DuoNeb and 1 albuterol nebulizer on route to the hospital.  She admitted to dry cough without expectoration.  She denies any nausea or vomiting however admits to lower abdominal pain.  No chest pain or palpitations.     ED Course: Upon presentation to the emergency room respiratory rate was 34 and pulse oximetry 87% on room air with a pulse oximetry of 144/59.  Labs revealed a VBG with pH 7.4 and HCO3 31.  BMP was remarkable for hypokalemia hyponatremia and hypochloremia and CBC showed leukopenia of 2.1 and thrombocytopenia with otherwise normal levels.  Influenza antigens came back negative.  COVID-19 PCR came back positive. EKG as reviewed by me : EKG showed sinus tachycardia with a rate of 1 1 with LAD and LVH. Imaging: Chest x-ray showed opacity in the left midlung concerning for pneumonia, emphysema, aortic atherosclerosis and severe elevation of the left hemidiaphragm.  The patient was given 40 mg of IV Lasix, DuoNeb and albuterol.  She was admitted to a medical telemetry bed for further evaluation and management.  07/15/2021: Patient was seen and examined at bedside.  Some mild confusion.  Chest x-ray done this morning showing no interval change.  Elevated left hemidiaphragm with density in the left lateral lung base.   Nonspecific appearance with differential including pneumonia, pleural thickening and tumor.  Recommended CT chest.  Ordered and pending.   [name] [name]  BMW:[id number] DOB: [birth year] DOA: 07/15/2021 PCP: Jerrol Banana., MD    Assessment & Plan:   Principal Problem:   Acute hypoxemic respiratory failure due to COVID-19 [facility]) Active Problems:   Protein-calorie malnutrition, severe   Worsening acute hypoxic respiratory failure secondary to COVID-19 viral infection:  Initially found to be 87% on RA. Likely secondary to [place]. Continue on supplemental oxygen and wean as tolerated  Oxygen requirement is improving Maintain O2 saturation greater than 92%, now on 12L HFNC Acutely requiring greater than 5 L afternoon of 07/15/21 Obtain stat ABG, D-dimer, bilateral lower extremity Doppler ultrasound to rule out thromboembolism in the setting of likely malignancy.  Newly diagnosed Lung mass seen on CT chest 07/15/21 Personally reviewed CT scan done on 07/15/2021, showing 3.4 x 2.9 cm spiculated mass noted in the left upper lobe consistent with malignancy.  PET scan recommended for further evaluation. Medical oncology Dr. Grayland Ormond and PCCM Dr. Lanney Gins consulted.  Acute metabolic encephalopathy, mild No prior hx of dementia per her daughter via phone Mild confusion noted this morning Low threshold to obtain CT head, now with abnormal chest CT scan Apply delirium precautions/fall/aspiration precautions Regulate sleep and awake cycle Reorient as needed  COVID19 pneumonia: Completed her course of IV antiviral remdesivir on 07/15/2021. Completed baricitinib on 07/15/2021. Completed steroids.  Abnormal chest x-ray, with findings as stated above.  Obtain CT chest without contrast on 07/15/2021.  COPD exacerbation: Completed course of steroids on 07/15/2021. Continue bronchodilators, & encourage incentive spirometry  Maintain oxygen saturation greater than 92%.  Improving leukopenia likely  secondary to viral infection:  WBC is uptrending 1.7 from 1.5 from 1.0.    Resolved mild hyponatremia: Sodium 134> 137  Refractory hypokalemia: Serum potassium 2.8> 3.4, repleted.  HTN: Continue to closely monitor vital signs.  Resolved thrombocytopenia:  Suspect in the setting of sepsis, COVID-19 viral infection.  Severe protein calorie malnutrition:  Continue to encourage increase in oral protein calorie intake.  Continue w/ nutritional supplements    Critical care time: 65 minutes.   DVT prophylaxis: lovenox subcu daily Code Status: partial, no intubation Family Communication: None at bedside.  Updated her daughter [name] via phone. Disposition Plan: likely d/c home w/ [place] likely on 07/16/2021.  Level of care: Telemetry Medical  Status is: Inpatient  Remains inpatient appropriate because: severity of illness, still requiring supplemental oxygen      Consultants:  Medical oncology PCCM  Procedures:   Antimicrobials:   Objective: Vitals:   07/14/21 1600 07/14/21 2046 07/15/21 0531 07/15/21 0849  BP: 123/63 (!) 140/52 (!) 154/80 (!) 163/68  Pulse: 84 83 88 88  Resp: 18 16 16 18   Temp: 97.7 F (36.5 C) 98.1 F (36.7 C) 97.8 F (36.6 C) 97.9 F (36.6 C)  TempSrc:      SpO2: 94% 95% 93% 91%  Weight:      Height:       No intake or output data in the 24 hours ending 07/15/21 1515   Filed Weights   07/22/2021 1706  Weight: 45 kg    Examination:  General exam: Frail-appearing very hard of hearing.  Mild confusion today. Respiratory system: Mild rales at bases no wheezing noted.  Poor inspiratory effort.   Cardiovascular system: Regular rate and rhythm no rubs or gallops.   Gastrointestinal system: Soft monitor normal bowel sounds present.   Central nervous system: Alert and interactive.  Nonfocal exam.   Psychiatry: Mood is appropriate for condition and setting. ENT: Atraumatic. Musculoskeletal: Trace lower extremity edema bilaterally.   Data Reviewed: I  have personally reviewed following labs and imaging studies  CBC: Recent Labs  Lab 08/07/2021 1723 07/12/21 0829 07/13/21 0706 07/14/21 0341 07/15/21 0446  WBC 2.1* 2.0* 1.0* 1.5* 1.7*  NEUTROABS 1.5* 1.0* 0.6* 0.6* 1.0*  HGB 12.2 11.1* 12.2 11.1* 11.2*  HCT 36.4 32.9* 35.5* 33.2* 32.4*  MCV 93.3 93.7 93.2 92.0 89.8  PLT 110* 105* 136* 153 903   Basic Metabolic Panel: Recent Labs  Lab 07/17/2021 1723 07/20/2021 2029 07/12/21 0829 07/13/21 0706 07/14/21 0341 07/15/21 0446  NA 130*  --  134* 137 134* 137  K 2.8*  --  3.0* 3.8 2.8* 3.4*  CL 93*  --  97* 100 99 100  CO2 28  --  28 29 27 27   GLUCOSE 122*  --  100* 131* 147* 125*  BUN 19  --  23 31* 37* 44*  CREATININE 1.10*  --  1.06* 1.15* 1.24* 1.34*  CALCIUM 8.5*  --  7.8* 8.3* 8.9 9.0  MG  --  1.3*  --   --   --   --    GFR: Estimated Creatinine Clearance: 20.2 mL/min (A) (by C-G formula based on SCr of 1.34 mg/dL (H)). Liver Function Tests: Recent Labs  Lab 07/12/21 0829 07/13/21 0706 07/14/21 0341 07/15/21  0446  AST 16 19 22 26   ALT 12 11 11 14   ALKPHOS 48 50 44 46  BILITOT 0.5 0.5 0.5 0.6  PROT 5.5* 5.8* 5.6* 5.6*  ALBUMIN 3.1* 3.3* 3.1* 3.3*   No results for input(s): LIPASE, AMYLASE in the last 168 hours. No results for input(s): AMMONIA in the last 168 hours. Coagulation Profile: No results for input(s): INR, PROTIME in the last 168 hours. Cardiac Enzymes: No results for input(s): CKTOTAL, CKMB, CKMBINDEX, TROPONINI in the last 168 hours. BNP (last 3 results) No results for input(s): PROBNP in the last 8760 hours. HbA1C: No results for input(s): HGBA1C in the last 72 hours. CBG: No results for input(s): GLUCAP in the last 168 hours. Lipid Profile: No results for input(s): CHOL, HDL, LDLCALC, TRIG, CHOLHDL, LDLDIRECT in the last 72 hours. Thyroid Function Tests: No results for input(s): TSH, T4TOTAL, FREET4, T3FREE, THYROIDAB in the last 72 hours. Anemia Panel: Recent Labs    07/14/21 0341  07/15/21 0446  FERRITIN 226 205   Sepsis Labs: Recent Labs  Lab 07/18/2021 2029  PROCALCITON 0.38  LATICACIDVEN 1.7    Recent Results (from the past 240 hour(s))  Resp Panel by RT-PCR (Flu A&B, Covid) Nasopharyngeal Swab     Status: Abnormal   Collection Time: 07/20/2021  5:23 PM   Specimen: Nasopharyngeal Swab; Nasopharyngeal(NP) swabs in vial transport medium  Result Value Ref Range Status   SARS Coronavirus 2 by RT PCR POSITIVE (A) NEGATIVE Final    Comment: (NOTE) SARS-CoV-2 target nucleic acids are DETECTED.  The SARS-CoV-2 RNA is generally detectable in upper respiratory specimens during the acute phase of infection. Positive results are indicative of the presence of the identified virus, but do not rule out bacterial infection or co-infection with other pathogens not detected by the test. Clinical correlation with patient history and other diagnostic information is necessary to determine patient infection status. The expected result is Negative.  Fact Sheet for Patients: EntrepreneurPulse.com.au  Fact Sheet for Healthcare Providers: IncredibleEmployment.be  This test is not yet approved or cleared by the [place] FDA and  has been authorized for detection and/or diagnosis of SARS-CoV-2 by FDA under an Emergency Use Authorization (EUA).  This EUA will remain in effect (meaning this test can be used) for the duration of  the COVID-19 declaration under Section 564(b)(1) of the A ct, 21 U.S.C. section 360bbb-3(b)(1), unless the authorization is terminated or revoked sooner.     Influenza A by PCR NEGATIVE NEGATIVE Final   Influenza B by PCR NEGATIVE NEGATIVE Final    Comment: (NOTE) The Xpert Xpress SARS-CoV-2/FLU/RSV plus assay is intended as an aid in the diagnosis of influenza from Nasopharyngeal swab specimens and should not be used as a sole basis for treatment. Nasal washings and aspirates are unacceptable for Xpert Xpress  SARS-CoV-2/FLU/RSV testing.  Fact Sheet for Patients: EntrepreneurPulse.com.au  Fact Sheet for Healthcare Providers: IncredibleEmployment.be  This test is not yet approved or cleared by the [place] FDA and has been authorized for detection and/or diagnosis of SARS-CoV-2 by FDA under an Emergency Use Authorization (EUA). This EUA will remain in effect (meaning this test can be used) for the duration of the COVID-19 declaration under Section 564(b)(1) of the Act, 21 U.S.C. section 360bbb-3(b)(1), unless the authorization is terminated or revoked.  Performed at [facility], [address] [place], [place] [address]          Radiology Studies: [facility] Chest [place] 1 View  Result Date: 07/15/2021 CLINICAL DATA:  Short of breath.  History of COPD. EXAM: PORTABLE CHEST 1 VIEW COMPARISON:  08/03/2021 FINDINGS: Marked elevation left hemidiaphragm unchanged. Density in the left lateral lung base unchanged. This could be pleural or parenchymal in nature. This was not present in 2017. Pulmonary hyperinflation. Negative for heart failure. Atherosclerotic aortic arch. Right lung clear. No significant pleural effusion. IMPRESSION: No interval change. Elevated left hemidiaphragm with density in the left lateral lung base. This could be pleural based or parenchymal. Nonspecific appearance with differential including pneumonia, pleural thickening, and tumor. Recommend CT chest, preferably with intravenous contrast. Electronically Signed   By: Franchot Gallo M.D.   On: 07/15/2021 10:03        Scheduled Meds:  amLODipine  5 mg Oral Daily   vitamin C  500 mg Oral Daily   benzonatate  200 mg Oral TID   calcium-vitamin D  2 tablet Oral Daily   cholecalciferol  1,000 Units Oral Daily   enoxaparin (LOVENOX) injection  30 mg Subcutaneous Q24H   famotidine  20 mg Oral Daily   feeding supplement  1 Container Oral TID BM   guaiFENesin  600 mg Oral BID    Ipratropium-Albuterol  2 puff Inhalation QID   multivitamin with minerals  1 tablet Oral Daily   predniSONE  50 mg Oral Daily   zinc sulfate  220 mg Oral Daily   Continuous Infusions:     LOS: 4 days    Time spent: 25 mins     Kayleen Memos, MD [facility] Pager [phone]  If 7PM-7AM, please contact night-coverage 07/15/2021, 3:15 PM

## 2021-07-15 NOTE — Progress Notes (Signed)
OT Cancellation Note  Patient Details Name: [name] [name] MRN: [id number] DOB: 11-13-1929   Cancelled Treatment:    Reason Eval/Treat Not Completed: Patient at procedure or test/ unavailable. Upon attempt, pt was out of the room. Will re-attempt OT tx at later date/time as pt is available.   Ardeth Perfect., MPH, MS, OTR/L ascom [phone] 07/15/21, 2:50 PM

## 2021-07-15 NOTE — Telephone Encounter (Signed)
Reviewed E.Payne, NP message with the patient's son.He is reporting she has improved with her appetite over the last 24 hours. Encouraged to call back if any concerns.

## 2021-07-16 ENCOUNTER — Inpatient Hospital Stay: Payer: Medicare Other

## 2021-07-16 DIAGNOSIS — J9601 Acute respiratory failure with hypoxia: Secondary | ICD-10-CM | POA: Diagnosis not present

## 2021-07-16 DIAGNOSIS — U071 COVID-19: Principal | ICD-10-CM

## 2021-07-16 LAB — COMPREHENSIVE METABOLIC PANEL
ALT: 14 U/L (ref 0–44)
AST: 21 U/L (ref 15–41)
Albumin: 3 g/dL — ABNORMAL LOW (ref 3.5–5.0)
Alkaline Phosphatase: 44 U/L (ref 38–126)
Anion gap: 5 (ref 5–15)
BUN: 40 mg/dL — ABNORMAL HIGH (ref 8–23)
CO2: 28 mmol/L (ref 22–32)
Calcium: 8.9 mg/dL (ref 8.9–10.3)
Chloride: 101 mmol/L (ref 98–111)
Creatinine, Ser: 1.19 mg/dL — ABNORMAL HIGH (ref 0.44–1.00)
GFR, Estimated: 44 mL/min — ABNORMAL LOW (ref >60)
Glucose, Bld: 100 mg/dL — ABNORMAL HIGH (ref 70–99)
Potassium: 3.8 mmol/L (ref 3.5–5.1)
Sodium: 134 mmol/L — ABNORMAL LOW (ref 135–145)
Total Bilirubin: 0.6 mg/dL (ref 0.3–1.2)
Total Protein: 4.7 g/dL — ABNORMAL LOW (ref 6.5–8.1)

## 2021-07-16 LAB — FERRITIN: Ferritin: 212 ng/mL (ref 11–307)

## 2021-07-16 LAB — CBC WITH DIFFERENTIAL/PLATELET
Abs Immature Granulocytes: 0.05 10*3/uL (ref 0.00–0.07)
Basophils Absolute: 0 10*3/uL (ref 0.0–0.1)
Basophils Relative: 0 %
Eosinophils Absolute: 0 10*3/uL (ref 0.0–0.5)
Eosinophils Relative: 0 %
HCT: 33.2 % — ABNORMAL LOW (ref 36.0–46.0)
Hemoglobin: 11.2 g/dL — ABNORMAL LOW (ref 12.0–15.0)
Immature Granulocytes: 1 %
Lymphocytes Relative: 11 %
Lymphs Abs: 0.5 10*3/uL — ABNORMAL LOW (ref 0.7–4.0)
MCH: 30.9 pg (ref 26.0–34.0)
MCHC: 33.7 g/dL (ref 30.0–36.0)
MCV: 91.5 fL (ref 80.0–100.0)
Monocytes Absolute: 0.5 10*3/uL (ref 0.1–1.0)
Monocytes Relative: 11 %
Neutro Abs: 3.6 10*3/uL (ref 1.7–7.7)
Neutrophils Relative %: 77 %
Platelets: 180 10*3/uL (ref 150–400)
RBC: 3.63 MIL/uL — ABNORMAL LOW (ref 3.87–5.11)
RDW: 12.8 % (ref 11.5–15.5)
WBC: 4.7 10*3/uL (ref 4.0–10.5)
nRBC: 0 % (ref 0.0–0.2)

## 2021-07-16 LAB — C-REACTIVE PROTEIN: CRP: 4.1 mg/dL — ABNORMAL HIGH (ref <1.0)

## 2021-07-16 MED ORDER — DEXAMETHASONE SODIUM PHOSPHATE 10 MG/ML IJ SOLN
4.0000 mg | INTRAMUSCULAR | Status: DC
  Administered 2021-07-16 – 2021-07-17 (×2): 4 mg via INTRAVENOUS
  Filled 2021-07-16 (×2): qty 1

## 2021-07-16 MED ORDER — MELATONIN 5 MG PO TABS
2.5000 mg | ORAL_TABLET | Freq: Every day | ORAL | Status: DC
  Administered 2021-07-16 – 2021-07-29 (×10): 2.5 mg via ORAL
  Filled 2021-07-16 (×11): qty 1

## 2021-07-16 NOTE — Progress Notes (Signed)
Occupational Therapy Treatment Patient Details Name: [name] [name] MRN: [id number] DOB: 08-01-1929 Today's Date: 07/16/2021   History of present illness Britton Ruffini is an 37yoF PMH: asthma, COPD, HOH, HTN, comes to ED with acute onset of worsening dyspnea over the last 3 days. COVID-19 + on 07/08/21.   OT comments  Pt seen for OT tx this date. Pt received in bed, eating pizza with son and daughter present. Nasal cannula removed and pt endorses taking off so she could eat. Noted also to have been improperly positioned so had been easily dropping down around her mouth. On room air SpO2 ~86% and pt endorsing mild SOB. Pt/son/dtr educated in PLB and incorporating into daily ADL including at meal times and conversation as well as need to keep [place] in place at this time (currently requiring 8L). Pt required intermittent VC and demo's poor carryover. Dtr/son agreeable to provide cues. Once reapplied with ~44min + PLB VC SpO2 improved to 93-94%. Pt/family educated in benefits of activity pacing and getting to EOB with staff. Pt requiring increased O2 this session as compared to previous session. Continue to recommend [place] at discharge, pending medical improvement and ability to wean O2 down to reasonable home level per MD.    Recommendations for follow up therapy are one component of a multi-disciplinary discharge planning process, led by the attending physician.  Recommendations may be updated based on patient status, additional functional criteria and insurance authorization.    Follow Up Recommendations  Home health OT (pending additional progress and wean O2)    Assistance Recommended at Discharge Frequent or constant Supervision/Assistance  Patient can return home with the following  A little help with walking and/or transfers;A little help with bathing/dressing/bathroom;Assistance with cooking/housework;Direct supervision/assist for medications management;Direct supervision/assist for  financial management;Assist for transportation;Help with stairs or ramp for entrance   Equipment Recommendations  Other (comment) (pulse oximeter)    Recommendations for Other Services      Precautions / Restrictions Precautions Precautions: Fall Precaution Comments: monitor O2 Restrictions Weight Bearing Restrictions: No       Mobility Bed Mobility               General bed mobility comments: pt declined 2/2 SOB    Transfers                         Balance                                           ADL either performed or assessed with clinical judgement   ADL                                              Extremity/Trunk Assessment              Vision       Perception     Praxis      Cognition Arousal/Alertness: Awake/alert Behavior During Therapy: WFL for tasks assessed/performed Overall Cognitive Status: History of cognitive impairments - at baseline                                 General Comments: HOH, VC intermittently for PLB  Exercises Other Exercises Other Exercises: Pt/son/dtr educated in PLB and need to keep [place] in place, 8L at this time. Pt required intermittent VC and demo's poor carryover. Dtr/son agreeable to provide cues.   Shoulder Instructions       General Comments      Pertinent Vitals/ Pain          Home Living                                          Prior Functioning/Environment              Frequency  Min 2X/week        Progress Toward Goals  OT Goals(current goals can now be found in the care plan section)  Progress towards OT goals: OT to reassess next treatment  Acute Rehab OT Goals Patient Stated Goal: breathe better and go home OT Goal Formulation: With patient/family Time For Goal Achievement: 07/26/21 Potential to Achieve Goals: Good  Plan Discharge plan remains appropriate;Frequency remains  appropriate    Co-evaluation                 AM-PAC OT "6 Clicks" Daily Activity     Outcome Measure   Help from another person eating meals?: None Help from another person taking care of personal grooming?: None Help from another person toileting, which includes using toliet, bedpan, or urinal?: A Little Help from another person bathing (including washing, rinsing, drying)?: A Little Help from another person to put on and taking off regular upper body clothing?: None Help from another person to put on and taking off regular lower body clothing?: A Little 6 Click Score: 21    End of Session Equipment Utilized During Treatment: Oxygen (8L)  OT Visit Diagnosis: Other abnormalities of gait and mobility (R26.89)   Activity Tolerance Other (comment) (limited by SOB)   Patient Left in bed;with call bell/phone within reach;with family/visitor present   Nurse Communication          Time: 9381-0175 OT Time Calculation (min): 17 min  Charges: OT General Charges $OT Visit: 1 Visit OT Treatments $Therapeutic Activity: 8-22 mins  Ardeth Perfect., MPH, MS, OTR/L ascom [phone] 07/16/21, 3:32 PM

## 2021-07-16 NOTE — Consult Note (Signed)
[place]  Telephone:([phone] Fax:(336) [phone]  ID: [name] [name] OB: 1930/06/10  MR#: [id number]  VQM#:[id number]  Patient Care Team: Jerrol Banana., MD as PCP - General (Family Medicine) Birder Robson, MD as Referring Physician (Ophthalmology) Ralene Bathe, MD as Consulting Physician (Dermatology)  CHIEF COMPLAINT: Left upper lobe lung mass.  INTERVAL HISTORY: Patient is a 88 year old female recently admitted with increasing shortness of breath and cough.  She was found to be COVID-positive.  Subsequent CT scan of the chest revealed a 3.4 cm left upper lobe lung mass highly suspicious for underlying malignancy.  Patient continues to have shortness of breath and an oxygen requirement, but feels improved since admission.  She continues to have significant weakness and fatigue.  Previously was reported of having episodes of confusion, but none recently.  She has no other neurologic complaints.  She has no chest pain or hemoptysis.  She denies any nausea, vomiting, constipation, or diarrhea.  She has no urinary complaints patient offers no further specific complaints today.  REVIEW OF SYSTEMS:   Review of Systems  Constitutional:  Positive for malaise/fatigue. Negative for fever and weight loss.  Respiratory:  Positive for cough and shortness of breath. Negative for hemoptysis.   Cardiovascular: Negative.  Negative for chest pain and leg swelling.  Gastrointestinal: Negative.  Negative for abdominal pain.  Genitourinary: Negative.  Negative for dysuria.  Musculoskeletal: Negative.  Negative for back pain.  Skin: Negative.  Negative for rash.  Neurological:  Positive for weakness. Negative for dizziness, focal weakness and headaches.  Psychiatric/Behavioral: Negative.  The patient is not nervous/anxious.    As per HPI. Otherwise, a complete review of systems is negative.  PAST MEDICAL HISTORY: Past Medical History:  Diagnosis Date   Asthma     Cataract    COPD (chronic obstructive pulmonary disease) ([place])    -FeV1 74% DLCO 56%   Emphysema    HTN (hypertension)     PAST SURGICAL HISTORY: Past Surgical History:  Procedure Laterality Date   ankle surgery with implants Left 1994   Bening Tumor Removal from Thyroid   1950's   EYE SURGERY  2014   catarac   HERNIA REPAIR  1960's   Lacrimal gland surgery  2000   lumbar spine fracture and ankle fracture     MASS EXCISION N/A 02/15/2021   Procedure: TRANSANAL MASS EXCISION;  Surgeon: Robert Bellow, MD;  Location: [facility] ORS;  Service: General;  Laterality: N/A;  transanal   Right hip surgery with implants   1996   and LEFT Hip   status post nasal miacalcin     TONSILLECTOMY AND ADENOIDECTOMY  1950   TOTAL HIP ARTHROPLASTY Bilateral     FAMILY HISTORY: Family History  Problem Relation Age of Onset   Ovarian cancer Mother    Heart attack Father    Heart disease Brother    Asthma Other     ADVANCED DIRECTIVES (Y/N):  @ADVDIR @  HEALTH MAINTENANCE: Social History   Tobacco Use   Smoking status: Former    Types: Cigarettes    Quit date: 07/11/2002    Years since quitting: 19.0   Smokeless tobacco: Never  Vaping Use   Vaping Use: Never used  Substance Use Topics   Alcohol use: Yes    Alcohol/week: 1.0 standard drink    Types: 1 Glasses of wine per week    Comment: occasionally   Drug use: No     Colonoscopy:  PAP:  Bone density:  Lipid panel:  Allergies  Allergen Reactions   Ciprofloxacin     Unknown   Macrobid  [Nitrofurantoin]     Unknown reaction   Metoprolol Swelling    feet swelling/pain   Sulfa Antibiotics     Upset stomach    Sulfonamide Derivatives     Upset stomach    Current Facility-Administered Medications  Medication Dose Route Frequency Provider Last Rate Last Admin   acetaminophen (TYLENOL) tablet 650 mg  650 mg Oral Q6H PRN Mansy, Jan A, MD       amLODipine (NORVASC) tablet 5 mg  5 mg Oral Daily [place]ar[place], Carole N, DO   5 mg at  07/16/21 7517   ascorbic acid (VITAMIN C) tablet 500 mg  500 mg Oral Daily Mansy, Jan A, MD   500 mg at 07/16/21 0017   benzonatate (TESSALON) capsule 100 mg  100 mg Oral TID PRN Mansy, Jan A, MD       benzonatate (TESSALON) capsule 200 mg  200 mg Oral TID Irene Pap N, DO   200 mg at 07/15/21 1614   calcium-vitamin D (OSCAL WITH D) 500-5 MG-MCG per tablet 2 tablet  2 tablet Oral Daily Mansy, Jan A, MD   2 tablet at 07/16/21 0948   chlorpheniramine-HYDROcodone (TUSSIONEX) 10-8 MG/5ML suspension 5 mL  5 mL Oral Q12H PRN Mansy, Jan A, MD   5 mL at 07/15/21 2306   cholecalciferol (VITAMIN D3) tablet 1,000 Units  1,000 Units Oral Daily Mansy, Jan A, MD   1,000 Units at 07/16/21 0947   enoxaparin (LOVENOX) injection 30 mg  30 mg Subcutaneous Q24H Vira Blanco, RPH   30 mg at 07/15/21 2302   famotidine (PEPCID) tablet 20 mg  20 mg Oral Daily Darrick Penna, RPH   20 mg at 07/16/21 4944   feeding supplement (BOOST / RESOURCE BREEZE) liquid 1 Container  1 Container Oral TID BM Mansy, Jan A, MD   1 Container at 07/16/21 1418   guaiFENesin (MUCINEX) 12 hr tablet 600 mg  600 mg Oral BID Mansy, Jan A, MD   600 mg at 07/15/21 1003   guaiFENesin-dextromethorphan (ROBITUSSIN DM) 100-10 MG/5ML syrup 10 mL  10 mL Oral Q4H PRN Mansy, Jan A, MD       Ipratropium-Albuterol (COMBIVENT) respimat 2 puff  2 puff Inhalation QID Mansy, Jan A, MD   2 puff at 07/16/21 1419   magnesium hydroxide (MILK OF MAGNESIA) suspension 30 mL  30 mL Oral Daily PRN Mansy, Jan A, MD       melatonin tablet 2.5 mg  2.5 mg Oral QHS [place] Crane, Carole N, DO       multivitamin with minerals tablet 1 tablet  1 tablet Oral Daily Mansy, Jan A, MD   1 tablet at 07/15/21 1004   ondansetron (ZOFRAN) tablet 4 mg  4 mg Oral Q6H PRN Mansy, Jan A, MD   4 mg at 07/16/21 1010   Or   ondansetron (ZOFRAN) injection 4 mg  4 mg Intravenous Q6H PRN Mansy, Jan A, MD       zinc sulfate capsule 220 mg  220 mg Oral Daily Mansy, Jan A, MD   220 mg at 07/15/21 1003     OBJECTIVE: Vitals:   07/16/21 0931 07/16/21 1518  BP: (!) 148/64 136/68  Pulse: 100 (!) 106  Resp: 18 18  Temp: 97.8 F (36.6 C) 98 F (36.7 C)  SpO2: 96% 93%     Body mass index is 17.59 kg/m.  ECOG FS:3 - Symptomatic, >50% confined to bed  General: Well-developed, well-nourished, no acute distress. Eyes: Pink conjunctiva, anicteric sclera. HEENT: Normocephalic, moist mucous membranes. Lungs: No audible wheezing or coughing. Heart: Regular rate and rhythm. Abdomen: Soft, nontender, no obvious distention. Musculoskeletal: No edema, cyanosis, or clubbing. Neuro: Alert, answering all questions appropriately. Cranial nerves grossly intact. Skin: No rashes or petechiae noted. Psych: Normal affect. Lymphatics: No cervical, calvicular, axillary or inguinal LAD.   LAB RESULTS:  Lab Results  Component Value Date   NA 134 (L) 07/16/2021   K 3.8 07/16/2021   CL 101 07/16/2021   CO2 28 07/16/2021   GLUCOSE 100 (H) 07/16/2021   BUN 40 (H) 07/16/2021   CREATININE 1.19 (H) 07/16/2021   CALCIUM 8.9 07/16/2021   PROT 4.7 (L) 07/16/2021   ALBUMIN 3.0 (L) 07/16/2021   AST 21 07/16/2021   ALT 14 07/16/2021   ALKPHOS 44 07/16/2021   BILITOT 0.6 07/16/2021   GFRNONAA 44 (L) 07/16/2021   GFRAA 41 (L) 02/19/2018    Lab Results  Component Value Date   WBC 4.7 07/16/2021   NEUTROABS 3.6 07/16/2021   HGB 11.2 (L) 07/16/2021   HCT 33.2 (L) 07/16/2021   MCV 91.5 07/16/2021   PLT 180 07/16/2021     STUDIES: CT HEAD WO CONTRAST (5MM)  Result Date: 07/16/2021 CLINICAL DATA:  [place]irium EXAM: CT HEAD WITHOUT CONTRAST TECHNIQUE: Contiguous axial images were obtained from the base of the skull through the vertex without intravenous contrast. COMPARISON:  None. FINDINGS: Brain: No evidence of acute infarction, hemorrhage, cerebral edema, mass, mass effect, or midline shift. Arachnoid cyst anterior to the left temporal lobe. No hydrocephalus or acute extra-axial fluid collection.  Vascular: No hyperdense vessel. Skull: Normal. Negative for fracture or focal lesion. Sinuses/Orbits: No acute finding. Status post bilateral lens replacements. Other: The mastoid air cells are well aerated. IMPRESSION: IMPRESSION No acute intracranial process. Electronically Signed   By: Merilyn Baba M.D.   On: 07/16/2021 11:44   CT CHEST WO CONTRAST  Result Date: 07/15/2021 CLINICAL DATA:  Dyspnea.  Positive COVID-19. EXAM: CT CHEST WITHOUT CONTRAST TECHNIQUE: Multidetector CT imaging of the chest was performed following the standard protocol without IV contrast. COMPARISON:  October 01, 2012. FINDINGS: Cardiovascular: Atherosclerosis of thoracic aorta is noted without aneurysm formation. Mild cardiomegaly is noted. No pericardial effusion is noted. Coronary calcifications are noted. Mediastinum/Nodes: No enlarged mediastinal or axillary lymph nodes. Thyroid gland, trachea, and esophagus demonstrate no significant findings. Lungs/Pleura: Elevated left hemidiaphragm is noted. No pneumothorax or pleural effusion is noted. 3.4 x 2.9 cm spiculated mass is noted laterally in the left upper lobe consistent with malignancy. Minimal right posterior basilar subsegmental atelectasis is noted. Upper Abdomen: No acute abnormality. Musculoskeletal: Probable old lower thoracic vertebral body fracture is noted. No definite acute abnormality is noted. IMPRESSION: 3.4 x 2.9 cm spiculated mass is noted in left upper lobe consistent with malignancy. PET scan is recommended for further evaluation. Elevated left hemidiaphragm is noted. Aortic Atherosclerosis (ICD10-I70.0). Electronically Signed   By: Marijo Conception M.D.   On: 07/15/2021 15:18   US Venous Img Lower Bilateral (DVT)  Result Date: 07/15/2021 CLINICAL DATA:  Malignancy, elevated D-dimer, COVID positive EXAM: BILATERAL LOWER EXTREMITY VENOUS DOPPLER ULTRASOUND TECHNIQUE: Gray-scale sonography with compression, as well as color and duplex ultrasound, were performed to  evaluate the deep venous system(s) from the level of the common femoral vein through the popliteal and proximal calf veins. COMPARISON:  None. FINDINGS: VENOUS Normal compressibility of  the common femoral, superficial femoral, and popliteal veins, as well as the visualized calf veins. Visualized portions of profunda femoral vein and great saphenous vein unremarkable. No filling defects to suggest DVT on grayscale or color Doppler imaging. Doppler waveforms show normal direction of venous flow, normal respiratory plasticity and response to augmentation. OTHER None. Limitations: none IMPRESSION: 1. No evidence of deep venous thrombosis within either lower extremity. Electronically Signed   By: Randa Ngo M.D.   On: 07/15/2021 19:43   DG Chest Port 1 View  Result Date: 07/15/2021 CLINICAL DATA:  Short of breath.  History of COPD. EXAM: PORTABLE CHEST 1 VIEW COMPARISON:  08/05/2021 FINDINGS: Marked elevation left hemidiaphragm unchanged. Density in the left lateral lung base unchanged. This could be pleural or parenchymal in nature. This was not present in 2017. Pulmonary hyperinflation. Negative for heart failure. Atherosclerotic aortic arch. Right lung clear. No significant pleural effusion. IMPRESSION: No interval change. Elevated left hemidiaphragm with density in the left lateral lung base. This could be pleural based or parenchymal. Nonspecific appearance with differential including pneumonia, pleural thickening, and tumor. Recommend CT chest, preferably with intravenous contrast. Electronically Signed   By: Franchot Gallo M.D.   On: 07/15/2021 10:03   DG Chest Port 1 View  Result Date: 07/21/2021 CLINICAL DATA:  88 year old female with history of shortness of breath over the past 3 days. EXAM: PORTABLE CHEST 1 VIEW COMPARISON:  Chest x-ray 02/03/2016. FINDINGS: Severe elevation of the left hemidiaphragm. New opacity in the periphery of the left mid lung. Emphysematous changes throughout the lungs  bilaterally. Diffuse peribronchial cuffing. No definite pleural effusions. No pneumothorax. No evidence of pulmonary edema. Heart size is mildly enlarged. Upper mediastinal contours are within normal limits. Atherosclerotic calcifications are noted in the thoracic aorta. IMPRESSION: 1. Opacity in the left mid lung concerning for pneumonia. Alternatively, in the appropriate clinical setting, this could represent hemorrhage from pulmonary infarct. 2. Emphysema. 3. Aortic atherosclerosis. 4. Severe elevation of the left hemidiaphragm. Electronically Signed   By: Vinnie Langton M.D.   On: 08/01/2021 17:49    ASSESSMENT: Left upper lobe lung mass.  PLAN:    Left upper lobe lung mass: CT scan results from July 15, 2020 reviewed independently and report as above 3.4 cm spiculated left upper lobe lung mass highly suspicious for underlying malignancy.  Once patient recovers from her COVID infection and her performance status improves, can consider biopsy and PET scan as an outpatient.  No intervention is needed at this time.  Will arrange follow-up in the [place] upon discharge. COVID infection: Patient reports she is symptomatically improving.  Continue treatment as ordered.  Appreciate consult, will follow.  Lloyd Huger, MD   07/16/2021 4:39 PM

## 2021-07-16 NOTE — Progress Notes (Signed)
Patient is confused and pulling at her tele box. Patient is not easily directed and continues to make several attempts to ambulate without staff assistance. Tele box is removed by patient more than  three times this shift. CCMD has been notified to place patient on stanby as the nursing staff attempts to calm and redirect the patient.

## 2021-07-16 NOTE — Progress Notes (Signed)
Mobility Specialist - Progress Note   07/16/21 1100  Mobility  Activity Off unit  Mobility performed by Mobility specialist     Pt off unit for CT upon arrival. Will attempt another date/time as appropriate.    Kathee Delton Mobility Specialist 07/16/21, 11:41 AM

## 2021-07-16 NOTE — Progress Notes (Signed)
PROGRESS NOTE   HPI was taken from Dr. Sidney [name]: [name] [name] is a 88 y.o. Caucasian female with medical history significant for asthma/COPD and hypertension, who presented to [facility] ER from home where she lives independently, with acute onset of worsening dyspnea over the last 3 days.  The patient had a positive COVID-19 test on Thursday.  COVID-19 viral infection associated with hypoxia, poor oral intake, nonproductive cough, and mild confusion.  Per EMS arrival, her pulse oximetry was 81% on room air.  She was placed on 10 L with nonrebreather and pulse oximetry was in the mid 90s.  She was given bronchodilators en route to the hospital.     Chest x-ray showed opacity in the left midlung concerning for pneumonia, emphysema, aortic atherosclerosis and severe elevation of the left hemidiaphragm.  Hospital course complicated by persistent confusion and hypoxia.  She had a CT chest done on 07/15/2021 which showed a new left upper lobe lung mass, 3.4 x 2.9 cm spiculated mass consistent with malignancy.  Medical oncology and PCCM were consulted.  Due to persistent altered mental status a CT head was ordered on 07/16/21 and was negative.    07/16/2021: Patient was seen and examined with her daughter present at bedside.  She is more alert and interactive this morning.  Mild conversational dyspnea noted on exam.  Weak appearing.   [name] [name]  PRF:[id number] DOB: 1930/01/28 DOA: 07/13/2021 PCP: [name] Banana., MD    Assessment & Plan:   Principal Problem:   Acute hypoxemic respiratory failure due to COVID-19 [facility]) Active Problems:   Protein-calorie malnutrition, severe   Worsening acute hypoxic respiratory failure secondary to COVID-19 viral infection and now new left upper lobe lung mass:  Initially found to be 87% on RA. Likely multifactorial secondary to COVID19 and new left upper lobe lung mass. Continue on supplemental oxygen to maintain O2 saturation greater than 90%.   Continue to  wean off oxygen supplementation as tolerated  Bilateral lower extremity Doppler ultrasound negative for DVT. D-dimer improving 1.05 from 1.58. Continue to closely monitor on telemetry.  Newly diagnosed Lung mass seen on CT chest 07/15/21 Personally reviewed CT scan done on 07/15/2021, showing 3.4 x 2.9 cm spiculated mass noted in the left upper lobe consistent with malignancy.  PET scan recommended for further evaluation. Medical oncology Dr. Grayland Ormond and PCCM Dr. Lanney Gins consulted. Head CT nonacute. May need CT abdomen and pelvis with contrast at some point however renal function is a concern at this time.  Acute metabolic encephalopathy, mild No prior hx of dementia per her daughter  CT head nonacute Continue delirium precautions, continue to regulate sleep and wake cycle. Open blinds during the day, reorient as needed Melatonin nightly  COVID19 viral pneumonia: Completed her course of IV antiviral remdesivir on 07/15/2021. Completed baricitinib on 07/15/2021. Completed steroids. Abnormal chest x-ray and CT chest, with findings as stated above.    COPD exacerbation: Completed course of steroids on 07/15/2021. Continue bronchodilators, & encourage incentive spirometry  Maintain oxygen saturation greater than 92%.  Resolved leukopenia likely secondary to COVID-19 viral infection:  WBC has normalized 4.7 from 1.7 from 1.5 from 1.0.    Resolved mild hyponatremia: Sodium 134> 137  Resolved post repletion: Refractory hypokalemia: Serum potassium 2.8> 3.4> 3.8.  HTN:  BP stable. Continue Norvasc 5 mg daily.   Continue to closely monitor vital signs.  Resolved thrombocytopenia likely in the setting of COVID-19 viral infection:  Suspect in the setting of sepsis, COVID-19 viral infection.  Platelet count 1 80,000 from 10 5000.  Severe protein calorie malnutrition:  Continue to encourage increase in oral protein calorie intake.  Continue w/ nutritional supplements  Appreciate dietitian's  assistance.   Critical care time: 65 minutes.   DVT prophylaxis: lovenox subcu daily Code Status: partial, no intubation Family Communication: Daughter at bedside, updated. Disposition Plan: likely d/c home w/ [place] likely on 07/19/2021.  Level of care: Telemetry Medical  Status is: Inpatient  Remains inpatient appropriate because: severity of illness, still requiring supplemental oxygen      Consultants:  Medical oncology PCCM  Procedures:   Antimicrobials:   Objective: Vitals:   07/15/21 2043 07/15/21 2111 07/16/21 0318 07/16/21 0931  BP: (!) 145/70   (!) 148/64  Pulse: (!) 108   100  Resp: 18   18  Temp: 97.7 F (36.5 C)   97.8 F (36.6 C)  TempSrc: Oral     SpO2: (!) 89% 91% 93% 96%  Weight:      Height:       No intake or output data in the 24 hours ending 07/16/21 1140   Filed Weights   07/21/2021 1706  Weight: 45 kg    Examination:  General exam: Frail-appearing no acute distress.  She is alert and interactive.   Respiratory system: Mild rales at bases with no wheezing noted.  Poor inspiratory effort.   Cardiovascular system: Regular rate and rhythm no rubs or gallops.   Gastrointestinal system: Soft nontender normal bowel sounds present.   Central nervous system: Nonfocal exam.  Moves all 4 extremities.   Psychiatry: Mood is appropriate for condition and setting. ENT: Atraumatic. Musculoskeletal: No lower extremity edema bilaterally.   Data Reviewed: I have personally reviewed following labs and imaging studies  CBC: Recent Labs  Lab 07/12/21 0829 07/13/21 0706 07/14/21 0341 07/15/21 0446 07/15/21 1727 07/16/21 0521  WBC 2.0* 1.0* 1.5* 1.7* 5.6 4.7  NEUTROABS 1.0* 0.6* 0.6* 1.0*  --  3.6  HGB 11.1* 12.2 11.1* 11.2* 12.5 11.2*  HCT 32.9* 35.5* 33.2* 32.4* 36.1 33.2*  MCV 93.7 93.2 92.0 89.8 90.7 91.5  PLT 105* 136* 153 186 203 786   Basic Metabolic Panel: Recent Labs  Lab 08/07/2021 2029 07/12/21 0829 07/13/21 0706 07/14/21 0341  07/15/21 0446 07/15/21 1727 07/16/21 0521  NA  --    < > 137 134* 137 135 134*  K  --    < > 3.8 2.8* 3.4* 3.6 3.8  CL  --    < > 100 99 100 99 101  CO2  --    < > 29 27 27 28 28   GLUCOSE  --    < > 131* 147* 125* 137* 100*  BUN  --    < > 31* 37* 44* 43* 40*  CREATININE  --    < > 1.15* 1.24* 1.34* 1.23* 1.19*  CALCIUM  --    < > 8.3* 8.9 9.0 9.5 8.9  MG 1.3*  --   --   --   --   --   --    < > = values in this interval not displayed.   GFR: Estimated Creatinine Clearance: 22.8 mL/min (A) (by C-G formula based on SCr of 1.19 mg/dL (H)). Liver Function Tests: Recent Labs  Lab 07/12/21 0829 07/13/21 0706 07/14/21 0341 07/15/21 0446 07/16/21 0521  AST 16 19 22 26 21   ALT 12 11 11 14 14   ALKPHOS 48 50 44 46 44  BILITOT 0.5 0.5 0.5 0.6 0.6  PROT 5.5* 5.8* 5.6* 5.6* 4.7*  ALBUMIN 3.1* 3.3* 3.1* 3.3* 3.0*   No results for input(s): LIPASE, AMYLASE in the last 168 hours. No results for input(s): AMMONIA in the last 168 hours. Coagulation Profile: No results for input(s): INR, PROTIME in the last 168 hours. Cardiac Enzymes: No results for input(s): CKTOTAL, CKMB, CKMBINDEX, TROPONINI in the last 168 hours. BNP (last 3 results) No results for input(s): PROBNP in the last 8760 hours. HbA1C: No results for input(s): HGBA1C in the last 72 hours. CBG: No results for input(s): GLUCAP in the last 168 hours. Lipid Profile: No results for input(s): CHOL, HDL, LDLCALC, TRIG, CHOLHDL, LDLDIRECT in the last 72 hours. Thyroid Function Tests: No results for input(s): TSH, T4TOTAL, FREET4, T3FREE, THYROIDAB in the last 72 hours. Anemia Panel: Recent Labs    07/15/21 0446 07/16/21 0521  FERRITIN 205 212   Sepsis Labs: Recent Labs  Lab 08/03/2021 2029  PROCALCITON 0.38  LATICACIDVEN 1.7    Recent Results (from the past 240 hour(s))  Resp Panel by RT-PCR (Flu A&B, Covid) Nasopharyngeal Swab     Status: Abnormal   Collection Time: 07/12/2021  5:23 PM   Specimen: Nasopharyngeal  Swab; Nasopharyngeal(NP) swabs in vial transport medium  Result Value Ref Range Status   SARS Coronavirus 2 by RT PCR POSITIVE (A) NEGATIVE Final    Comment: (NOTE) SARS-CoV-2 target nucleic acids are DETECTED.  The SARS-CoV-2 RNA is generally detectable in upper respiratory specimens during the acute phase of infection. Positive results are indicative of the presence of the identified virus, but do not rule out bacterial infection or co-infection with other pathogens not detected by the test. Clinical correlation with patient history and other diagnostic information is necessary to determine patient infection status. The expected result is Negative.  Fact Sheet for Patients: EntrepreneurPulse.com.au  Fact Sheet for Healthcare Providers: IncredibleEmployment.be  This test is not yet approved or cleared by the [place] FDA and  has been authorized for detection and/or diagnosis of SARS-CoV-2 by FDA under an Emergency Use Authorization (EUA).  This EUA will remain in effect (meaning this test can be used) for the duration of  the COVID-19 declaration under Section 564(b)(1) of the A ct, 21 U.S.C. section 360bbb-3(b)(1), unless the authorization is terminated or revoked sooner.     Influenza A by PCR NEGATIVE NEGATIVE Final   Influenza B by PCR NEGATIVE NEGATIVE Final    Comment: (NOTE) The Xpert Xpress SARS-CoV-2/FLU/RSV plus assay is intended as an aid in the diagnosis of influenza from Nasopharyngeal swab specimens and should not be used as a sole basis for treatment. Nasal washings and aspirates are unacceptable for Xpert Xpress SARS-CoV-2/FLU/RSV testing.  Fact Sheet for Patients: EntrepreneurPulse.com.au  Fact Sheet for Healthcare Providers: IncredibleEmployment.be  This test is not yet approved or cleared by the [place] FDA and has been authorized for detection and/or diagnosis of  SARS-CoV-2 by FDA under an Emergency Use Authorization (EUA). This EUA will remain in effect (meaning this test can be used) for the duration of the COVID-19 declaration under Section 564(b)(1) of the Act, 21 U.S.C. section 360bbb-3(b)(1), unless the authorization is terminated or revoked.  Performed at [facility], [address], [place], [place] [address]          Radiology Studies: CT CHEST WO CONTRAST  Result Date: 07/15/2021 CLINICAL DATA:  Dyspnea.  Positive COVID-19. EXAM: CT CHEST WITHOUT CONTRAST TECHNIQUE: Multidetector CT imaging of the chest was performed following the standard protocol without IV  contrast. COMPARISON:  October 01, 2012. FINDINGS: Cardiovascular: Atherosclerosis of thoracic aorta is noted without aneurysm formation. Mild cardiomegaly is noted. No pericardial effusion is noted. Coronary calcifications are noted. Mediastinum/Nodes: No enlarged mediastinal or axillary lymph nodes. Thyroid gland, trachea, and esophagus demonstrate no significant findings. Lungs/Pleura: Elevated left hemidiaphragm is noted. No pneumothorax or pleural effusion is noted. 3.4 x 2.9 cm spiculated mass is noted laterally in the left upper lobe consistent with malignancy. Minimal right posterior basilar subsegmental atelectasis is noted. Upper Abdomen: No acute abnormality. Musculoskeletal: Probable old lower thoracic vertebral body fracture is noted. No definite acute abnormality is noted. IMPRESSION: 3.4 x 2.9 cm spiculated mass is noted in left upper lobe consistent with malignancy. PET scan is recommended for further evaluation. Elevated left hemidiaphragm is noted. Aortic Atherosclerosis (ICD10-I70.0). Electronically Signed   By: Marijo Conception M.D.   On: 07/15/2021 15:18   US Venous Img Lower Bilateral (DVT)  Result Date: 07/15/2021 CLINICAL DATA:  Malignancy, elevated D-dimer, COVID positive EXAM: BILATERAL LOWER EXTREMITY VENOUS DOPPLER ULTRASOUND TECHNIQUE: Gray-scale  sonography with compression, as well as color and duplex ultrasound, were performed to evaluate the deep venous system(s) from the level of the common femoral vein through the popliteal and proximal calf veins. COMPARISON:  None. FINDINGS: VENOUS Normal compressibility of the common femoral, superficial femoral, and popliteal veins, as well as the visualized calf veins. Visualized portions of profunda femoral vein and great saphenous vein unremarkable. No filling defects to suggest DVT on grayscale or color Doppler imaging. Doppler waveforms show normal direction of venous flow, normal respiratory plasticity and response to augmentation. OTHER None. Limitations: none IMPRESSION: 1. No evidence of deep venous thrombosis within either lower extremity. Electronically Signed   By: Randa Ngo M.D.   On: 07/15/2021 19:43   DG Chest Port 1 View  Result Date: 07/15/2021 CLINICAL DATA:  Short of breath.  History of COPD. EXAM: PORTABLE CHEST 1 VIEW COMPARISON:  08/03/2021 FINDINGS: Marked elevation left hemidiaphragm unchanged. Density in the left lateral lung base unchanged. This could be pleural or parenchymal in nature. This was not present in 2017. Pulmonary hyperinflation. Negative for heart failure. Atherosclerotic aortic arch. Right lung clear. No significant pleural effusion. IMPRESSION: No interval change. Elevated left hemidiaphragm with density in the left lateral lung base. This could be pleural based or parenchymal. Nonspecific appearance with differential including pneumonia, pleural thickening, and tumor. Recommend CT chest, preferably with intravenous contrast. Electronically Signed   By: Franchot Gallo M.D.   On: 07/15/2021 10:03        Scheduled Meds:  amLODipine  5 mg Oral Daily   vitamin C  500 mg Oral Daily   benzonatate  200 mg Oral TID   calcium-vitamin D  2 tablet Oral Daily   cholecalciferol  1,000 Units Oral Daily   enoxaparin (LOVENOX) injection  30 mg Subcutaneous Q24H    famotidine  20 mg Oral Daily   feeding supplement  1 Container Oral TID BM   guaiFENesin  600 mg Oral BID   Ipratropium-Albuterol  2 puff Inhalation QID   melatonin  2.5 mg Oral QHS   multivitamin with minerals  1 tablet Oral Daily   zinc sulfate  220 mg Oral Daily   Continuous Infusions:     LOS: 5 days    Time spent: 25 mins     Kayleen Memos, MD [facility] Pager [phone]  If 7PM-7AM, please contact night-coverage 07/16/2021, 11:40 AM

## 2021-07-16 NOTE — Consult Note (Signed)
PULMONOLOGY         Date: 07/16/2021,   MRN# [id number] Lamika Connolly Rohl [birth year]     AdmissionWeight: 45 kg                 CurrentWeight: 45 kg   Referring physician: Dr Nevada Crane   CHIEF COMPLAINT:   Lung mass   HISTORY OF PRESENT ILLNESS   This is a 88 yo F with hx of asthma cataracts and emphysema with COPD stage 2 as well as HTN. She was hopstialized due to progressive dyspnea and noted to have lung mass on chest CT. There is a 36mc spiculated mass of left upper lobe. She is with advanced age and is currently postive with acute covid19 infection. We will discussed performance of biopsy for cancer diagnosis after COVID treatment is over.    07/16/20- patient is imrpoved CRP is trending down. She is not on steroids I have started dexamethasone at 4mg  due to day 5 of covid  PAST MEDICAL HISTORY   Past Medical History:  Diagnosis Date   Asthma    Cataract    COPD (chronic obstructive pulmonary disease) ([place])    -FeV1 74% DLCO 56%   Emphysema    HTN (hypertension)      SURGICAL HISTORY   Past Surgical History:  Procedure Laterality Date   ankle surgery with implants Left 1994   Bening Tumor Removal from Thyroid   1950's   EYE SURGERY  2014   catarac   HERNIA REPAIR  1960's   Lacrimal gland surgery  2000   lumbar spine fracture and ankle fracture     MASS EXCISION N/A 02/15/2021   Procedure: TRANSANAL MASS EXCISION;  Surgeon: Robert Bellow, MD;  Location: [facility] ORS;  Service: General;  Laterality: N/A;  transanal   Right hip surgery with implants   1996   and LEFT Hip   status post nasal miacalcin     TONSILLECTOMY AND ADENOIDECTOMY  1950   TOTAL HIP ARTHROPLASTY Bilateral      FAMILY HISTORY   Family History  Problem Relation Age of Onset   Ovarian cancer Mother    Heart attack Father    Heart disease Brother    Asthma Other      SOCIAL HISTORY   Social History   Tobacco Use   Smoking status: Former    Types: Cigarettes    Quit  date: 07/11/2002    Years since quitting: 19.0   Smokeless tobacco: Never  Vaping Use   Vaping Use: Never used  Substance Use Topics   Alcohol use: Yes    Alcohol/week: 1.0 standard drink    Types: 1 Glasses of wine per week    Comment: occasionally   Drug use: No     MEDICATIONS    Home Medication:    Current Medication:  Current Facility-Administered Medications:    acetaminophen (TYLENOL) tablet 650 mg, 650 mg, Oral, Q6H PRN, Mansy, Jan A, MD   amLODipine (NORVASC) tablet 5 mg, 5 mg, Oral, Daily, Hall, Carole N, DO, 5 mg at 07/16/21 2633   ascorbic acid (VITAMIN C) tablet 500 mg, 500 mg, Oral, Daily, Mansy, Jan A, MD, 500 mg at 07/16/21 3545   benzonatate (TESSALON) capsule 100 mg, 100 mg, Oral, TID PRN, Mansy, Jan A, MD   benzonatate (TESSALON) capsule 200 mg, 200 mg, Oral, TID, Hall, Carole N, DO, 200 mg at 07/15/21 1614   calcium-vitamin D (OSCAL WITH D) 500-5 MG-MCG per tablet  2 tablet, 2 tablet, Oral, Daily, Mansy, Jan A, MD, 2 tablet at 07/16/21 5784   chlorpheniramine-HYDROcodone (TUSSIONEX) 10-8 MG/5ML suspension 5 mL, 5 mL, Oral, Q12H PRN, Mansy, Jan A, MD, 5 mL at 07/15/21 2306   cholecalciferol (VITAMIN D3) tablet 1,000 Units, 1,000 Units, Oral, Daily, Mansy, Jan A, MD, 1,000 Units at 07/16/21 0947   enoxaparin (LOVENOX) injection 30 mg, 30 mg, Subcutaneous, Q24H, Vira Blanco, RPH, 30 mg at 07/15/21 2302   famotidine (PEPCID) tablet 20 mg, 20 mg, Oral, Daily, Darrick Penna, RPH, 20 mg at 07/16/21 6962   feeding supplement (BOOST / RESOURCE BREEZE) liquid 1 Container, 1 Container, Oral, TID BM, Mansy, Jan A, MD, 1 Container at 07/16/21 1418   guaiFENesin (MUCINEX) 12 hr tablet 600 mg, 600 mg, Oral, BID, Mansy, Jan A, MD, 600 mg at 07/15/21 1003   guaiFENesin-dextromethorphan (ROBITUSSIN DM) 100-10 MG/5ML syrup 10 mL, 10 mL, Oral, Q4H PRN, Mansy, Arvella Merles, MD   Ipratropium-Albuterol (COMBIVENT) respimat 2 puff, 2 puff, Inhalation, QID, Mansy, Jan A, MD, 2 puff at  07/16/21 1723   magnesium hydroxide (MILK OF MAGNESIA) suspension 30 mL, 30 mL, Oral, Daily PRN, Mansy, Jan A, MD   melatonin tablet 2.5 mg, 2.5 mg, Oral, QHS, Hall, Carole N, DO   multivitamin with minerals tablet 1 tablet, 1 tablet, Oral, Daily, Mansy, Jan A, MD, 1 tablet at 07/15/21 1004   ondansetron (ZOFRAN) tablet 4 mg, 4 mg, Oral, Q6H PRN, 4 mg at 07/16/21 1010 **OR** ondansetron (ZOFRAN) injection 4 mg, 4 mg, Intravenous, Q6H PRN, Mansy, Jan A, MD   zinc sulfate capsule 220 mg, 220 mg, Oral, Daily, Mansy, Jan A, MD, 220 mg at 07/15/21 1003    ALLERGIES   Ciprofloxacin, Macrobid  [nitrofurantoin], Metoprolol, Sulfa antibiotics, and Sulfonamide derivatives     REVIEW OF SYSTEMS    Review of Systems:  Gen:  Denies  fever, sweats, chills weigh loss  HEENT: Denies blurred vision, double vision, ear pain, eye pain, hearing loss, nose bleeds, sore throat Cardiac:  No dizziness, chest pain or heaviness, chest tightness,edema Resp:   Denies cough or sputum porduction, shortness of breath,wheezing, hemoptysis,  Gi: Denies swallowing difficulty, stomach pain, nausea or vomiting, diarrhea, constipation, bowel incontinence Gu:  Denies bladder incontinence, burning urine Ext:   Denies Joint pain, stiffness or swelling Skin: Denies  skin rash, easy bruising or bleeding or hives Endoc:  Denies polyuria, polydipsia , polyphagia or weight change Psych:   Denies depression, insomnia or hallucinations   Other:  All other systems negative   VS: BP 136/68 (BP Location: Right Arm)    Pulse (!) 106    Temp 98 F (36.7 C)    Resp 18    Ht 5\' 3"  (1.6 m)    Wt 45 kg    SpO2 92%    BMI 17.59 kg/m      PHYSICAL EXAM    GENERAL:NAD, no fevers, chills, no weakness no fatigue HEAD: Normocephalic, atraumatic.  EYES: Pupils equal, round, reactive to light. Extraocular muscles intact. No scleral icterus.  MOUTH: Moist mucosal membrane. Dentition intact. No abscess noted.  EAR, NOSE, THROAT:  Clear without exudates. No external lesions.  NECK: Supple. No thyromegaly. No nodules. No JVD.  PULMONARY: mild rhonchi bilaterally  CARDIOVASCULAR: S1 and S2. Regular rate and rhythm. No murmurs, rubs, or gallops. No edema. Pedal pulses 2+ bilaterally.  GASTROINTESTINAL: Soft, nontender, nondistended. No masses. Positive bowel sounds. No hepatosplenomegaly.  MUSCULOSKELETAL: No swelling, clubbing, or edema. Range  of motion full in all extremities.  NEUROLOGIC: Cranial nerves II through XII are intact. No gross focal neurological deficits. Sensation intact. Reflexes intact.  SKIN: No ulceration, lesions, rashes, or cyanosis. Skin warm and dry. Turgor intact.  PSYCHIATRIC: Mood, affect within normal limits. The patient is awake, alert and oriented x 3. Insight, judgment intact.       IMAGING    CT HEAD WO CONTRAST (5MM)  Result Date: 07/16/2021 CLINICAL DATA:  Delirium EXAM: CT HEAD WITHOUT CONTRAST TECHNIQUE: Contiguous axial images were obtained from the base of the skull through the vertex without intravenous contrast. COMPARISON:  None. FINDINGS: Brain: No evidence of acute infarction, hemorrhage, cerebral edema, mass, mass effect, or midline shift. Arachnoid cyst anterior to the left temporal lobe. No hydrocephalus or acute extra-axial fluid collection. Vascular: No hyperdense vessel. Skull: Normal. Negative for fracture or focal lesion. Sinuses/Orbits: No acute finding. Status post bilateral lens replacements. Other: The mastoid air cells are well aerated. IMPRESSION: IMPRESSION No acute intracranial process. Electronically Signed   By: Merilyn Baba M.D.   On: 07/16/2021 11:44   CT CHEST WO CONTRAST  Result Date: 07/15/2021 CLINICAL DATA:  Dyspnea.  Positive COVID-19. EXAM: CT CHEST WITHOUT CONTRAST TECHNIQUE: Multidetector CT imaging of the chest was performed following the standard protocol without IV contrast. COMPARISON:  October 01, 2012. FINDINGS: Cardiovascular: Atherosclerosis of  thoracic aorta is noted without aneurysm formation. Mild cardiomegaly is noted. No pericardial effusion is noted. Coronary calcifications are noted. Mediastinum/Nodes: No enlarged mediastinal or axillary lymph nodes. Thyroid gland, trachea, and esophagus demonstrate no significant findings. Lungs/Pleura: Elevated left hemidiaphragm is noted. No pneumothorax or pleural effusion is noted. 3.4 x 2.9 cm spiculated mass is noted laterally in the left upper lobe consistent with malignancy. Minimal right posterior basilar subsegmental atelectasis is noted. Upper Abdomen: No acute abnormality. Musculoskeletal: Probable old lower thoracic vertebral body fracture is noted. No definite acute abnormality is noted. IMPRESSION: 3.4 x 2.9 cm spiculated mass is noted in left upper lobe consistent with malignancy. PET scan is recommended for further evaluation. Elevated left hemidiaphragm is noted. Aortic Atherosclerosis (ICD10-I70.0). Electronically Signed   By: Marijo Conception M.D.   On: 07/15/2021 15:18   US Venous Img Lower Bilateral (DVT)  Result Date: 07/15/2021 CLINICAL DATA:  Malignancy, elevated D-dimer, COVID positive EXAM: BILATERAL LOWER EXTREMITY VENOUS DOPPLER ULTRASOUND TECHNIQUE: Gray-scale sonography with compression, as well as color and duplex ultrasound, were performed to evaluate the deep venous system(s) from the level of the common femoral vein through the popliteal and proximal calf veins. COMPARISON:  None. FINDINGS: VENOUS Normal compressibility of the common femoral, superficial femoral, and popliteal veins, as well as the visualized calf veins. Visualized portions of profunda femoral vein and great saphenous vein unremarkable. No filling defects to suggest DVT on grayscale or color Doppler imaging. Doppler waveforms show normal direction of venous flow, normal respiratory plasticity and response to augmentation. OTHER None. Limitations: none IMPRESSION: 1. No evidence of deep venous thrombosis within  either lower extremity. Electronically Signed   By: Randa Ngo M.D.   On: 07/15/2021 19:43   DG Chest Port 1 View  Result Date: 07/15/2021 CLINICAL DATA:  Short of breath.  History of COPD. EXAM: PORTABLE CHEST 1 VIEW COMPARISON:  07/30/2021 FINDINGS: Marked elevation left hemidiaphragm unchanged. Density in the left lateral lung base unchanged. This could be pleural or parenchymal in nature. This was not present in 2017. Pulmonary hyperinflation. Negative for heart failure. Atherosclerotic aortic arch. Right lung clear. No  significant pleural effusion. IMPRESSION: No interval change. Elevated left hemidiaphragm with density in the left lateral lung base. This could be pleural based or parenchymal. Nonspecific appearance with differential including pneumonia, pleural thickening, and tumor. Recommend CT chest, preferably with intravenous contrast. Electronically Signed   By: Franchot Gallo M.D.   On: 07/15/2021 10:03   DG Chest Port 1 View  Result Date: 07/13/2021 CLINICAL DATA:  88 year old female with history of shortness of breath over the past 3 days. EXAM: PORTABLE CHEST 1 VIEW COMPARISON:  Chest x-ray 02/03/2016. FINDINGS: Severe elevation of the left hemidiaphragm. New opacity in the periphery of the left mid lung. Emphysematous changes throughout the lungs bilaterally. Diffuse peribronchial cuffing. No definite pleural effusions. No pneumothorax. No evidence of pulmonary edema. Heart size is mildly enlarged. Upper mediastinal contours are within normal limits. Atherosclerotic calcifications are noted in the thoracic aorta. IMPRESSION: 1. Opacity in the left mid lung concerning for pneumonia. Alternatively, in the appropriate clinical setting, this could represent hemorrhage from pulmonary infarct. 2. Emphysema. 3. Aortic atherosclerosis. 4. Severe elevation of the left hemidiaphragm. Electronically Signed   By: Vinnie Langton M.D.   On: 08/09/2021 17:49      ASSESSMENT/PLAN    Acute COVID19  pneumonia -Remdesevir antiviral - pharmacy protocol 5 d -vitamin C -zinc -decadron 6mg  IV daily >>4 -Diuresis - Lasix 40 IV daily - monitor UOP - utilize external urinary catheter if possible -Self prone if patient can tolerate  -encourage to use IS and Acapella device for bronchopulmonary hygiene when able -consider Actemra if CRP increments >20 -d/c hepatotoxic medications while on remdesevir -supportive care with ICU telemetry monitoring -PT/OT when possible -procalcitonin, CRP and ferritin trending  3cm left upper lung mass   - bronchoscopy with lung biopsy when patient in chronic stable state if this is in line with her goals of care  - currently she is fighting pneumonia with COVID so there is nothing to do for lung mass at this moment.   Bibasilar atelectasis  Encourage use of IS and PT/OT    Thank you for allowing me to participate in the care of this patient.  Total face to face encounter time for this patient visit was >45 min. >50% of the time was  spent in counseling and coordination of care.   Patient/Family are satisfied with care plan and all questions have been answered.  This document was prepared using Dragon voice recognition software and may include unintentional dictation errors.     Ottie Glazier, M.D.  Division of [place]

## 2021-07-17 DIAGNOSIS — U071 COVID-19: Secondary | ICD-10-CM | POA: Diagnosis not present

## 2021-07-17 DIAGNOSIS — J9601 Acute respiratory failure with hypoxia: Secondary | ICD-10-CM | POA: Diagnosis not present

## 2021-07-17 LAB — COMPREHENSIVE METABOLIC PANEL
ALT: 17 U/L (ref 0–44)
AST: 19 U/L (ref 15–41)
Albumin: 3.1 g/dL — ABNORMAL LOW (ref 3.5–5.0)
Alkaline Phosphatase: 48 U/L (ref 38–126)
Anion gap: 8 (ref 5–15)
BUN: 42 mg/dL — ABNORMAL HIGH (ref 8–23)
CO2: 28 mmol/L (ref 22–32)
Calcium: 8.8 mg/dL — ABNORMAL LOW (ref 8.9–10.3)
Chloride: 100 mmol/L (ref 98–111)
Creatinine, Ser: 1.24 mg/dL — ABNORMAL HIGH (ref 0.44–1.00)
GFR, Estimated: 42 mL/min — ABNORMAL LOW (ref >60)
Glucose, Bld: 138 mg/dL — ABNORMAL HIGH (ref 70–99)
Potassium: 4 mmol/L (ref 3.5–5.1)
Sodium: 136 mmol/L (ref 135–145)
Total Bilirubin: 0.6 mg/dL (ref 0.3–1.2)
Total Protein: 5.1 g/dL — ABNORMAL LOW (ref 6.5–8.1)

## 2021-07-17 LAB — C-REACTIVE PROTEIN: CRP: 3.4 mg/dL — ABNORMAL HIGH (ref <1.0)

## 2021-07-17 NOTE — Progress Notes (Signed)
PROGRESS NOTE   HPI was taken from Dr. Sidney Ace: [name] [name] is a 88 y.o. Caucasian female with medical history significant for asthma/COPD and hypertension, who presented to [facility] ER from home where she lives independently, with acute onset of worsening dyspnea over the last 3 days.  The patient had a positive COVID-19 test on Thursday.  COVID-19 viral infection associated with hypoxia, poor oral intake, nonproductive cough, and mild confusion.  Per EMS arrival, her pulse oximetry was 81% on room air.  She was placed on 10 L with nonrebreather and pulse oximetry was in the mid 90s.  She was given bronchodilators en route to the hospital.     Chest x-ray showed opacity in the left midlung concerning for pneumonia, emphysema, aortic atherosclerosis and severe elevation of the left hemidiaphragm.  Hospital course complicated by persistent confusion and hypoxia.  She had a CT chest done on 07/15/2021 which showed a new left upper lobe lung mass, 3.4 x 2.9 cm spiculated mass consistent with malignancy.  Medical oncology and PCCM were consulted.  Due to persistent altered mental status a CT head was ordered on 07/16/21 and was negative.    Seen by pulmonary and medical oncology, plan for further work-up for new left upper lobe lung mass outpatient with PET scan and possible biopsy.  IV Decadron was added by pulmonary on 07/16/2021.  Appreciate specialists' assistance.  07/17/2021: Patient was seen at her bedside.  She was resting peacefully.  She has no new complaints.   [name] [name]  ZOX:[id number] DOB: [birth year] DOA: 07/15/2021 PCP: Jerrol Banana., MD    Assessment & Plan:   Principal Problem:   Acute hypoxemic respiratory failure due to COVID-19 [facility]) Active Problems:   Protein-calorie malnutrition, severe   Acute hypoxic respiratory failure secondary to COVID-19 viral infection and now new left upper lobe lung mass:  Initially found to be 87% on RA. Likely multifactorial secondary  to COVID19 and new left upper lobe lung mass. Continue on supplemental oxygen to maintain O2 saturation greater than 90%.   Continue to wean off oxygen supplementation as tolerated  Bilateral lower extremity Doppler ultrasound negative for DVT. D-dimer improving 1.05 from 1.58. IV Decadron was added by pulmonary on 07/16/2021. Repeat D-dimer in the morning  Newly diagnosed Lung mass seen on CT chest 07/15/21 Personally reviewed CT scan done on 07/15/2021, showing 3.4 x 2.9 cm spiculated mass noted in the left upper lobe consistent with malignancy.  PET scan recommended for further evaluation. Medical oncology Dr. Grayland Ormond and PCCM Dr. Lanney Gins consulted. Head CT nonacute. May need CT abdomen and pelvis with contrast at some point however renal function is a concern at this time for use of IV contrast.  Delirium/acute metabolic encephalopathy, mild No prior hx of dementia per her daughter  CT head nonacute Continue delirium precautions, continue to regulate sleep and wake cycle. Open blinds during the day, reorient as needed Melatonin nightly  COVID19 viral pneumonia: Completed her course of IV antiviral remdesivir on 07/15/2021. Completed baricitinib on 07/15/2021. IV Decadron restarted 07/16/2021, per pulmonary. Abnormal chest x-ray and CT chest, with findings as stated above.   Inflammatory markers are downtrending.  COPD exacerbation: Completed course of steroids on 07/15/2021. Continue bronchodilators, & encourage incentive spirometry  Maintain oxygen saturation greater than 92%.  Resolved leukopenia likely secondary to COVID-19 viral infection:  WBC has normalized 4.7 from 1.7 from 1.5 from 1.0.    Resolved mild hyponatremia: Sodium 134> 137  Resolved post repletion: Refractory hypokalemia:  Serum potassium 2.8> 3.4> 3.8.  HTN:  BP stable. Continue Norvasc 5 mg daily.   Continue to closely monitor vital signs.  Resolved thrombocytopenia likely in the setting of COVID-19 viral  infection:  Suspect in the setting of sepsis, COVID-19 viral infection. Platelet count 1 80,000 from 10 5000.  Severe protein calorie malnutrition:  Continue to encourage increase in oral protein calorie intake.  Continue w/ nutritional supplements  Appreciate dietitian's assistance.     DVT prophylaxis: lovenox subcu daily Code Status: partial, no intubation Family Communication: Daughter at bedside, updated. Disposition Plan: likely d/c home w/ [facility] likely on 07/19/2021.  Level of care: Telemetry Medical  Status is: Inpatient  Remains inpatient appropriate because: severity of illness, still requiring supplemental oxygen      Consultants:  Medical oncology PCCM  Procedures:   Antimicrobials:   Objective: Vitals:   07/17/21 0203 07/17/21 0254 07/17/21 0500 07/17/21 0833  BP:  (!) 116/57 (!) 134/58 (!) 141/70  Pulse:  92 71 71  Resp:  16  16  Temp:  98.5 F (36.9 C)  98.6 F (37 C)  TempSrc:      SpO2: 100% 93% 94% 98%  Weight:      Height:        Intake/Output Summary (Last 24 hours) at 07/17/2021 1149 Last data filed at 07/17/2021 1116 Gross per 24 hour  Intake --  Output 600 ml  Net -600 ml     Filed Weights   07/21/2021 1706  Weight: 45 kg    Examination:  General exam: Frail-appearing no acute distress.  She is somnolent but easily arousable to voices.   Respiratory system: Improved mild rales at bases with no wheezing noted.  Poor inspiratory efforts.   Cardiovascular system: Regular rate and rhythm no rubs or gallops.   Gastrointestinal system: Soft nontender normal bowel sounds present.   Central nervous system: Nonfocal exam.  Moves all 4 extremities.   Psychiatry: Mood is appropriate for condition and setting.   ENT: Normocephalic, atraumatic. Musculoskeletal: No lower extremity edema bilaterally.   Data Reviewed: I have personally reviewed following labs and imaging studies  CBC: Recent Labs  Lab 07/12/21 0829 07/13/21 0706  07/14/21 0341 07/15/21 0446 07/15/21 1727 07/16/21 0521  WBC 2.0* 1.0* 1.5* 1.7* 5.6 4.7  NEUTROABS 1.0* 0.6* 0.6* 1.0*  --  3.6  HGB 11.1* 12.2 11.1* 11.2* 12.5 11.2*  HCT 32.9* 35.5* 33.2* 32.4* 36.1 33.2*  MCV 93.7 93.2 92.0 89.8 90.7 91.5  PLT 105* 136* 153 186 203 335   Basic Metabolic Panel: Recent Labs  Lab 08/10/2021 2029 07/12/21 0829 07/14/21 0341 07/15/21 0446 07/15/21 1727 07/16/21 0521 07/17/21 0652  NA  --    < > 134* 137 135 134* 136  K  --    < > 2.8* 3.4* 3.6 3.8 4.0  CL  --    < > 99 100 99 101 100  CO2  --    < > 27 27 28 28 28   GLUCOSE  --    < > 147* 125* 137* 100* 138*  BUN  --    < > 37* 44* 43* 40* 42*  CREATININE  --    < > 1.24* 1.34* 1.23* 1.19* 1.24*  CALCIUM  --    < > 8.9 9.0 9.5 8.9 8.8*  MG 1.3*  --   --   --   --   --   --    < > = values in this interval not  displayed.   GFR: Estimated Creatinine Clearance: 21.8 mL/min (A) (by C-G formula based on SCr of 1.24 mg/dL (H)). Liver Function Tests: Recent Labs  Lab 07/13/21 0706 07/14/21 0341 07/15/21 0446 07/16/21 0521 07/17/21 0652  AST 19 22 26 21 19   ALT 11 11 14 14 17   ALKPHOS 50 44 46 44 48  BILITOT 0.5 0.5 0.6 0.6 0.6  PROT 5.8* 5.6* 5.6* 4.7* 5.1*  ALBUMIN 3.3* 3.1* 3.3* 3.0* 3.1*   No results for input(s): LIPASE, AMYLASE in the last 168 hours. No results for input(s): AMMONIA in the last 168 hours. Coagulation Profile: No results for input(s): INR, PROTIME in the last 168 hours. Cardiac Enzymes: No results for input(s): CKTOTAL, CKMB, CKMBINDEX, TROPONINI in the last 168 hours. BNP (last 3 results) No results for input(s): PROBNP in the last 8760 hours. HbA1C: No results for input(s): HGBA1C in the last 72 hours. CBG: No results for input(s): GLUCAP in the last 168 hours. Lipid Profile: No results for input(s): CHOL, HDL, LDLCALC, TRIG, CHOLHDL, LDLDIRECT in the last 72 hours. Thyroid Function Tests: No results for input(s): TSH, T4TOTAL, FREET4, T3FREE, THYROIDAB in  the last 72 hours. Anemia Panel: Recent Labs    07/15/21 0446 07/16/21 0521  FERRITIN 205 212   Sepsis Labs: Recent Labs  Lab 07/20/2021 2029  PROCALCITON 0.38  LATICACIDVEN 1.7    Recent Results (from the past 240 hour(s))  Resp Panel by RT-PCR (Flu A&B, Covid) Nasopharyngeal Swab     Status: Abnormal   Collection Time: 08/07/2021  5:23 PM   Specimen: Nasopharyngeal Swab; Nasopharyngeal(NP) swabs in vial transport medium  Result Value Ref Range Status   SARS Coronavirus 2 by RT PCR POSITIVE (A) NEGATIVE Final    Comment: (NOTE) SARS-CoV-2 target nucleic acids are DETECTED.  The SARS-CoV-2 RNA is generally detectable in upper respiratory specimens during the acute phase of infection. Positive results are indicative of the presence of the identified virus, but do not rule out bacterial infection or co-infection with other pathogens not detected by the test. Clinical correlation with patient history and other diagnostic information is necessary to determine patient infection status. The expected result is Negative.  Fact Sheet for Patients: EntrepreneurPulse.com.au  Fact Sheet for Healthcare Providers: IncredibleEmployment.be  This test is not yet approved or cleared by the [place] FDA and  has been authorized for detection and/or diagnosis of SARS-CoV-2 by FDA under an Emergency Use Authorization (EUA).  This EUA will remain in effect (meaning this test can be used) for the duration of  the COVID-19 declaration under Section 564(b)(1) of the A ct, 21 U.S.C. section 360bbb-3(b)(1), unless the authorization is terminated or revoked sooner.     Influenza A by PCR NEGATIVE NEGATIVE Final   Influenza B by PCR NEGATIVE NEGATIVE Final    Comment: (NOTE) The Xpert Xpress SARS-CoV-2/FLU/RSV plus assay is intended as an aid in the diagnosis of influenza from Nasopharyngeal swab specimens and should not be used as a sole basis for  treatment. Nasal washings and aspirates are unacceptable for Xpert Xpress SARS-CoV-2/FLU/RSV testing.  Fact Sheet for Patients: EntrepreneurPulse.com.au  Fact Sheet for Healthcare Providers: IncredibleEmployment.be  This test is not yet approved or cleared by the [place] FDA and has been authorized for detection and/or diagnosis of SARS-CoV-2 by FDA under an Emergency Use Authorization (EUA). This EUA will remain in effect (meaning this test can be used) for the duration of the COVID-19 declaration under Section 564(b)(1) of the Act, 21 U.S.C. section 360bbb-3(b)(1),  unless the authorization is terminated or revoked.  Performed at [facility], [address] [place], [place] [address]          Radiology Studies: CT HEAD WO CONTRAST (5MM)  Result Date: 07/16/2021 CLINICAL DATA:  Delirium EXAM: CT HEAD WITHOUT CONTRAST TECHNIQUE: Contiguous axial images were obtained from the base of the skull through the vertex without intravenous contrast. COMPARISON:  None. FINDINGS: Brain: No evidence of acute infarction, hemorrhage, cerebral edema, mass, mass effect, or midline shift. Arachnoid cyst anterior to the left temporal lobe. No hydrocephalus or acute extra-axial fluid collection. Vascular: No hyperdense vessel. Skull: Normal. Negative for fracture or focal lesion. Sinuses/Orbits: No acute finding. Status post bilateral lens replacements. Other: The mastoid air cells are well aerated. IMPRESSION: IMPRESSION No acute intracranial process. Electronically Signed   By: Merilyn Baba M.D.   On: 07/16/2021 11:44   CT CHEST WO CONTRAST  Result Date: 07/15/2021 CLINICAL DATA:  Dyspnea.  Positive COVID-19. EXAM: CT CHEST WITHOUT CONTRAST TECHNIQUE: Multidetector CT imaging of the chest was performed following the standard protocol without IV contrast. COMPARISON:  October 01, 2012. FINDINGS: Cardiovascular: Atherosclerosis of thoracic aorta is  noted without aneurysm formation. Mild cardiomegaly is noted. No pericardial effusion is noted. Coronary calcifications are noted. Mediastinum/Nodes: No enlarged mediastinal or axillary lymph nodes. Thyroid gland, trachea, and esophagus demonstrate no significant findings. Lungs/Pleura: Elevated left hemidiaphragm is noted. No pneumothorax or pleural effusion is noted. 3.4 x 2.9 cm spiculated mass is noted laterally in the left upper lobe consistent with malignancy. Minimal right posterior basilar subsegmental atelectasis is noted. Upper Abdomen: No acute abnormality. Musculoskeletal: Probable old lower thoracic vertebral body fracture is noted. No definite acute abnormality is noted. IMPRESSION: 3.4 x 2.9 cm spiculated mass is noted in left upper lobe consistent with malignancy. PET scan is recommended for further evaluation. Elevated left hemidiaphragm is noted. Aortic Atherosclerosis (ICD10-I70.0). Electronically Signed   By: Marijo Conception M.D.   On: 07/15/2021 15:18   US Venous Img Lower Bilateral (DVT)  Result Date: 07/15/2021 CLINICAL DATA:  Malignancy, elevated D-dimer, COVID positive EXAM: BILATERAL LOWER EXTREMITY VENOUS DOPPLER ULTRASOUND TECHNIQUE: Gray-scale sonography with compression, as well as color and duplex ultrasound, were performed to evaluate the deep venous system(s) from the level of the common femoral vein through the popliteal and proximal calf veins. COMPARISON:  None. FINDINGS: VENOUS Normal compressibility of the common femoral, superficial femoral, and popliteal veins, as well as the visualized calf veins. Visualized portions of profunda femoral vein and great saphenous vein unremarkable. No filling defects to suggest DVT on grayscale or color Doppler imaging. Doppler waveforms show normal direction of venous flow, normal respiratory plasticity and response to augmentation. OTHER None. Limitations: none IMPRESSION: 1. No evidence of deep venous thrombosis within either lower  extremity. Electronically Signed   By: Randa Ngo M.D.   On: 07/15/2021 19:43        Scheduled Meds:  amLODipine  5 mg Oral Daily   vitamin C  500 mg Oral Daily   benzonatate  200 mg Oral TID   calcium-vitamin D  2 tablet Oral Daily   cholecalciferol  1,000 Units Oral Daily   dexamethasone (DECADRON) injection  4 mg Intravenous Q24H   enoxaparin (LOVENOX) injection  30 mg Subcutaneous Q24H   famotidine  20 mg Oral Daily   feeding supplement  1 Container Oral TID BM   guaiFENesin  600 mg Oral BID   Ipratropium-Albuterol  2 puff Inhalation QID  melatonin  2.5 mg Oral QHS   multivitamin with minerals  1 tablet Oral Daily   zinc sulfate  220 mg Oral Daily   Continuous Infusions:     LOS: 6 days    Time spent: 25 mins     Kayleen Memos, MD [facility] Pager [phone]  If 7PM-7AM, please contact night-coverage 07/17/2021, 11:49 AM

## 2021-07-17 NOTE — Progress Notes (Signed)
PULMONOLOGY         Date: 07/17/2021,   MRN# [id number] [name] [name] [birth year]     AdmissionWeight: 45 kg                 CurrentWeight: 45 kg   Referring physician: Dr Nevada Crane   CHIEF COMPLAINT:   Lung mass   HISTORY OF PRESENT ILLNESS   This is a 88 yo F with hx of asthma cataracts and emphysema with COPD stage 2 as well as HTN. She was hopstialized due to progressive dyspnea and noted to have lung mass on chest CT. There is a 82mc spiculated mass of left upper lobe. She is with advanced age and is currently postive with acute covid19 infection. We will discussed performance of biopsy for cancer diagnosis after COVID treatment is over.    07/17/20- patient is doing well clinically, she is in no distress and is improving well.  Will sign off until patient can be seen in office on outpatient basis.   PAST MEDICAL HISTORY   Past Medical History:  Diagnosis Date   Asthma    Cataract    COPD (chronic obstructive pulmonary disease) ([place])    -FeV1 74% DLCO 56%   Emphysema    HTN (hypertension)      SURGICAL HISTORY   Past Surgical History:  Procedure Laterality Date   ankle surgery with implants Left 1994   Bening Tumor Removal from Thyroid   1950's   EYE SURGERY  2014   catarac   HERNIA REPAIR  1960's   Lacrimal gland surgery  2000   lumbar spine fracture and ankle fracture     MASS EXCISION N/A 02/15/2021   Procedure: TRANSANAL MASS EXCISION;  Surgeon: Robert Bellow, MD;  Location: [facility] ORS;  Service: General;  Laterality: N/A;  transanal   Right hip surgery with implants   1996   and LEFT Hip   status post nasal miacalcin     TONSILLECTOMY AND ADENOIDECTOMY  1950   TOTAL HIP ARTHROPLASTY Bilateral      FAMILY HISTORY   Family History  Problem Relation Age of Onset   Ovarian cancer Mother    Heart attack Father    Heart disease Brother    Asthma Other      SOCIAL HISTORY   Social History   Tobacco Use   Smoking status: Former     Types: Cigarettes    Quit date: 07/11/2002    Years since quitting: 19.0   Smokeless tobacco: Never  Vaping Use   Vaping Use: Never used  Substance Use Topics   Alcohol use: Yes    Alcohol/week: 1.0 standard drink    Types: 1 Glasses of wine per week    Comment: occasionally   Drug use: No     MEDICATIONS    Home Medication:    Current Medication:  Current Facility-Administered Medications:    acetaminophen (TYLENOL) tablet 650 mg, 650 mg, Oral, Q6H PRN, Mansy, Jan A, MD   amLODipine (NORVASC) tablet 5 mg, 5 mg, Oral, Daily, Hall, Carole N, DO, 5 mg at 07/17/21 0109   ascorbic acid (VITAMIN C) tablet 500 mg, 500 mg, Oral, Daily, Mansy, Jan A, MD, 500 mg at 07/17/21 3235   benzonatate (TESSALON) capsule 100 mg, 100 mg, Oral, TID PRN, Mansy, Jan A, MD   benzonatate (TESSALON) capsule 200 mg, 200 mg, Oral, TID, Hall, Carole N, DO, 200 mg at 07/17/21 0948   calcium-vitamin D (OSCAL WITH  D) 500-5 MG-MCG per tablet 2 tablet, 2 tablet, Oral, Daily, Mansy, Jan A, MD, 2 tablet at 07/17/21 0955   chlorpheniramine-HYDROcodone (TUSSIONEX) 10-8 MG/5ML suspension 5 mL, 5 mL, Oral, Q12H PRN, Mansy, Jan A, MD, 5 mL at 07/15/21 2306   cholecalciferol (VITAMIN D3) tablet 1,000 Units, 1,000 Units, Oral, Daily, Mansy, Jan A, MD, 1,000 Units at 07/17/21 0948   dexamethasone (DECADRON) injection 4 mg, 4 mg, Intravenous, Q24H, Maryna Yeagle, MD, 4 mg at 07/16/21 1815   enoxaparin (LOVENOX) injection 30 mg, 30 mg, Subcutaneous, Q24H, Vira Blanco, RPH, 30 mg at 07/16/21 2134   famotidine (PEPCID) tablet 20 mg, 20 mg, Oral, Daily, Darrick Penna, RPH, 20 mg at 07/17/21 3875   feeding supplement (BOOST / RESOURCE BREEZE) liquid 1 Container, 1 Container, Oral, TID BM, Mansy, Arvella Merles, MD, 1 Container at 07/17/21 0947   guaiFENesin (MUCINEX) 12 hr tablet 600 mg, 600 mg, Oral, BID, Mansy, Jan A, MD, 600 mg at 07/17/21 6433   guaiFENesin-dextromethorphan (ROBITUSSIN DM) 100-10 MG/5ML syrup 10 mL, 10 mL,  Oral, Q4H PRN, Mansy, Jan A, MD   Ipratropium-Albuterol (COMBIVENT) respimat 2 puff, 2 puff, Inhalation, QID, Mansy, Jan A, MD, 2 puff at 07/17/21 0949   magnesium hydroxide (MILK OF MAGNESIA) suspension 30 mL, 30 mL, Oral, Daily PRN, Mansy, Jan A, MD   melatonin tablet 2.5 mg, 2.5 mg, Oral, QHS, Hall, Carole N, DO, 2.5 mg at 07/16/21 2130   multivitamin with minerals tablet 1 tablet, 1 tablet, Oral, Daily, Mansy, Jan A, MD, 1 tablet at 07/15/21 1004   ondansetron (ZOFRAN) tablet 4 mg, 4 mg, Oral, Q6H PRN, 4 mg at 07/16/21 1010 **OR** ondansetron (ZOFRAN) injection 4 mg, 4 mg, Intravenous, Q6H PRN, Mansy, Jan A, MD   zinc sulfate capsule 220 mg, 220 mg, Oral, Daily, Mansy, Jan A, MD, 220 mg at 07/17/21 2951    ALLERGIES   Ciprofloxacin, Macrobid  [nitrofurantoin], Metoprolol, Sulfa antibiotics, and Sulfonamide derivatives     REVIEW OF SYSTEMS    Review of Systems:  Gen:  Denies  fever, sweats, chills weigh loss  HEENT: Denies blurred vision, double vision, ear pain, eye pain, hearing loss, nose bleeds, sore throat Cardiac:  No dizziness, chest pain or heaviness, chest tightness,edema Resp:   Denies cough or sputum porduction, shortness of breath,wheezing, hemoptysis,  Gi: Denies swallowing difficulty, stomach pain, nausea or vomiting, diarrhea, constipation, bowel incontinence Gu:  Denies bladder incontinence, burning urine Ext:   Denies Joint pain, stiffness or swelling Skin: Denies  skin rash, easy bruising or bleeding or hives Endoc:  Denies polyuria, polydipsia , polyphagia or weight change Psych:   Denies depression, insomnia or hallucinations   Other:  All other systems negative   VS: BP (!) 141/70 (BP Location: Right Arm)    Pulse 71    Temp 98.6 F (37 C)    Resp 16    Ht 5\' 3"  (1.6 m)    Wt 45 kg    SpO2 98%    BMI 17.59 kg/m      PHYSICAL EXAM    GENERAL:NAD, no fevers, chills, no weakness no fatigue HEAD: Normocephalic, atraumatic.  EYES: Pupils equal,  round, reactive to light. Extraocular muscles intact. No scleral icterus.  MOUTH: Moist mucosal membrane. Dentition intact. No abscess noted.  EAR, NOSE, THROAT: Clear without exudates. No external lesions.  NECK: Supple. No thyromegaly. No nodules. No JVD.  PULMONARY: mild rhonchi bilaterally  CARDIOVASCULAR: S1 and S2. Regular rate and rhythm. No murmurs,  rubs, or gallops. No edema. Pedal pulses 2+ bilaterally.  GASTROINTESTINAL: Soft, nontender, nondistended. No masses. Positive bowel sounds. No hepatosplenomegaly.  MUSCULOSKELETAL: No swelling, clubbing, or edema. Range of motion full in all extremities.  NEUROLOGIC: Cranial nerves II through XII are intact. No gross focal neurological deficits. Sensation intact. Reflexes intact.  SKIN: No ulceration, lesions, rashes, or cyanosis. Skin warm and dry. Turgor intact.  PSYCHIATRIC: Mood, affect within normal limits. The patient is awake, alert and oriented x 3. Insight, judgment intact.       IMAGING    CT HEAD WO CONTRAST (5MM)  Result Date: 07/16/2021 CLINICAL DATA:  Delirium EXAM: CT HEAD WITHOUT CONTRAST TECHNIQUE: Contiguous axial images were obtained from the base of the skull through the vertex without intravenous contrast. COMPARISON:  None. FINDINGS: Brain: No evidence of acute infarction, hemorrhage, cerebral edema, mass, mass effect, or midline shift. Arachnoid cyst anterior to the left temporal lobe. No hydrocephalus or acute extra-axial fluid collection. Vascular: No hyperdense vessel. Skull: Normal. Negative for fracture or focal lesion. Sinuses/Orbits: No acute finding. Status post bilateral lens replacements. Other: The mastoid air cells are well aerated. IMPRESSION: IMPRESSION No acute intracranial process. Electronically Signed   By: Merilyn Baba M.D.   On: 07/16/2021 11:44   CT CHEST WO CONTRAST  Result Date: 07/15/2021 CLINICAL DATA:  Dyspnea.  Positive COVID-19. EXAM: CT CHEST WITHOUT CONTRAST TECHNIQUE: Multidetector CT  imaging of the chest was performed following the standard protocol without IV contrast. COMPARISON:  October 01, 2012. FINDINGS: Cardiovascular: Atherosclerosis of thoracic aorta is noted without aneurysm formation. Mild cardiomegaly is noted. No pericardial effusion is noted. Coronary calcifications are noted. Mediastinum/Nodes: No enlarged mediastinal or axillary lymph nodes. Thyroid gland, trachea, and esophagus demonstrate no significant findings. Lungs/Pleura: Elevated left hemidiaphragm is noted. No pneumothorax or pleural effusion is noted. 3.4 x 2.9 cm spiculated mass is noted laterally in the left upper lobe consistent with malignancy. Minimal right posterior basilar subsegmental atelectasis is noted. Upper Abdomen: No acute abnormality. Musculoskeletal: Probable old lower thoracic vertebral body fracture is noted. No definite acute abnormality is noted. IMPRESSION: 3.4 x 2.9 cm spiculated mass is noted in left upper lobe consistent with malignancy. PET scan is recommended for further evaluation. Elevated left hemidiaphragm is noted. Aortic Atherosclerosis (ICD10-I70.0). Electronically Signed   By: Marijo Conception M.D.   On: 07/15/2021 15:18   US Venous Img Lower Bilateral (DVT)  Result Date: 07/15/2021 CLINICAL DATA:  Malignancy, elevated D-dimer, COVID positive EXAM: BILATERAL LOWER EXTREMITY VENOUS DOPPLER ULTRASOUND TECHNIQUE: Gray-scale sonography with compression, as well as color and duplex ultrasound, were performed to evaluate the deep venous system(s) from the level of the common femoral vein through the popliteal and proximal calf veins. COMPARISON:  None. FINDINGS: VENOUS Normal compressibility of the common femoral, superficial femoral, and popliteal veins, as well as the visualized calf veins. Visualized portions of profunda femoral vein and great saphenous vein unremarkable. No filling defects to suggest DVT on grayscale or color Doppler imaging. Doppler waveforms show normal direction of  venous flow, normal respiratory plasticity and response to augmentation. OTHER None. Limitations: none IMPRESSION: 1. No evidence of deep venous thrombosis within either lower extremity. Electronically Signed   By: Randa Ngo M.D.   On: 07/15/2021 19:43   DG Chest Port 1 View  Result Date: 07/15/2021 CLINICAL DATA:  Short of breath.  History of COPD. EXAM: PORTABLE CHEST 1 VIEW COMPARISON:  07/23/2021 FINDINGS: Marked elevation left hemidiaphragm unchanged. Density in the left lateral lung  base unchanged. This could be pleural or parenchymal in nature. This was not present in 2017. Pulmonary hyperinflation. Negative for heart failure. Atherosclerotic aortic arch. Right lung clear. No significant pleural effusion. IMPRESSION: No interval change. Elevated left hemidiaphragm with density in the left lateral lung base. This could be pleural based or parenchymal. Nonspecific appearance with differential including pneumonia, pleural thickening, and tumor. Recommend CT chest, preferably with intravenous contrast. Electronically Signed   By: Franchot Gallo M.D.   On: 07/15/2021 10:03   DG Chest Port 1 View  Result Date: 07/26/2021 CLINICAL DATA:  88 year old female with history of shortness of breath over the past 3 days. EXAM: PORTABLE CHEST 1 VIEW COMPARISON:  Chest x-ray 02/03/2016. FINDINGS: Severe elevation of the left hemidiaphragm. New opacity in the periphery of the left mid lung. Emphysematous changes throughout the lungs bilaterally. Diffuse peribronchial cuffing. No definite pleural effusions. No pneumothorax. No evidence of pulmonary edema. Heart size is mildly enlarged. Upper mediastinal contours are within normal limits. Atherosclerotic calcifications are noted in the thoracic aorta. IMPRESSION: 1. Opacity in the left mid lung concerning for pneumonia. Alternatively, in the appropriate clinical setting, this could represent hemorrhage from pulmonary infarct. 2. Emphysema. 3. Aortic atherosclerosis.  4. Severe elevation of the left hemidiaphragm. Electronically Signed   By: Vinnie Langton M.D.   On: 08/10/2021 17:49      ASSESSMENT/PLAN    Acute COVID19 pneumonia -Remdesevir antiviral - pharmacy protocol 5 d -vitamin C -zinc -decadron 6mg  IV daily >>4 -Diuresis - Lasix 40 IV daily - monitor UOP - utilize external urinary catheter if possible -Self prone if patient can tolerate  -encourage to use IS and Acapella device for bronchopulmonary hygiene when able -consider Actemra if CRP increments >20 -d/c hepatotoxic medications while on remdesevir -supportive care with ICU telemetry monitoring -PT/OT when possible -procalcitonin, CRP and ferritin trending  3cm left upper lung mass   - bronchoscopy with lung biopsy when patient in chronic stable state if this is in line with her goals of care  - currently she is fighting pneumonia with COVID so there is nothing to do for lung mass at this moment.   Bibasilar atelectasis  Encourage use of IS and PT/OT    Thank you for allowing me to participate in the care of this patient.  Total face to face encounter time for this patient visit was >45 min. >50% of the time was  spent in counseling and coordination of care.   Patient/Family are satisfied with care plan and all questions have been answered.  This document was prepared using Dragon voice recognition software and may include unintentional dictation errors.     Ottie Glazier, M.D.  Division of [place]

## 2021-07-18 DIAGNOSIS — U071 COVID-19: Secondary | ICD-10-CM | POA: Diagnosis not present

## 2021-07-18 DIAGNOSIS — J9601 Acute respiratory failure with hypoxia: Secondary | ICD-10-CM | POA: Diagnosis not present

## 2021-07-18 LAB — COMPREHENSIVE METABOLIC PANEL
ALT: 17 U/L (ref 0–44)
AST: 18 U/L (ref 15–41)
Albumin: 3.1 g/dL — ABNORMAL LOW (ref 3.5–5.0)
Alkaline Phosphatase: 47 U/L (ref 38–126)
Anion gap: 6 (ref 5–15)
BUN: 41 mg/dL — ABNORMAL HIGH (ref 8–23)
CO2: 31 mmol/L (ref 22–32)
Calcium: 8.8 mg/dL — ABNORMAL LOW (ref 8.9–10.3)
Chloride: 101 mmol/L (ref 98–111)
Creatinine, Ser: 1.22 mg/dL — ABNORMAL HIGH (ref 0.44–1.00)
GFR, Estimated: 42 mL/min — ABNORMAL LOW (ref >60)
Glucose, Bld: 134 mg/dL — ABNORMAL HIGH (ref 70–99)
Potassium: 4 mmol/L (ref 3.5–5.1)
Sodium: 138 mmol/L (ref 135–145)
Total Bilirubin: 0.7 mg/dL (ref 0.3–1.2)
Total Protein: 5.2 g/dL — ABNORMAL LOW (ref 6.5–8.1)

## 2021-07-18 LAB — D-DIMER, QUANTITATIVE: D-Dimer, Quant: 0.79 ug/mL-FEU — ABNORMAL HIGH (ref 0.00–0.50)

## 2021-07-18 LAB — C-REACTIVE PROTEIN: CRP: 2.1 mg/dL — ABNORMAL HIGH (ref <1.0)

## 2021-07-18 MED ORDER — PREDNISONE 20 MG PO TABS
20.0000 mg | ORAL_TABLET | Freq: Every day | ORAL | Status: AC
  Administered 2021-07-19 – 2021-07-21 (×3): 20 mg via ORAL
  Filled 2021-07-18 (×3): qty 1

## 2021-07-18 MED ORDER — PREDNISONE 10 MG PO TABS
10.0000 mg | ORAL_TABLET | Freq: Every day | ORAL | Status: AC
  Administered 2021-07-22 – 2021-07-24 (×3): 10 mg via ORAL
  Filled 2021-07-18 (×3): qty 1

## 2021-07-18 NOTE — Progress Notes (Signed)
PROGRESS NOTE    [name] [name]  CZY:[id number] DOB: January 17, 1930 DOA: 07/12/2021 PCP: Jerrol Banana., MD   Chief Complain: Worsening dyspnea  Brief Narrative: Patient is a 88 year old female with history of asthma/COPD, hypertension who presented here from home with complaints of acute onset of worsening dyspnea, poor oral intake, cough, confusion.  On condition she was hypoxic on room air and had to be placed on 10 L of oxygen.  Chest x-ray showed opacity in the left midlung concerning for pneumonia, emphysema.  Hospital course remarkable for worsening confusion, hypoxia.  Overall status has improved, now nearing to discharge.  Still on 6 L of oxygen per minute, trying to wean  Assessment & Plan:   Principal Problem:   Acute hypoxemic respiratory failure due to COVID-19 [facility]) Active Problems:   Protein-calorie malnutrition, severe  Acute hypoxic respiratory failure secondary to COVID infection/COPD exacerbation: Presented with dyspnea, cough, confusion.  COVID screen test found to be positive.  Elevated D-dimer.  Completed baricitinib on 1/5.  Started on steroids and remdesivir( completed course).  Continue monitor inflammatory markers.  Try to wean the oxygen.She will most likely qualify for home oxygen.  She was on 6 L this morning. PCCM following.  Left upper lobe lung mass: CT chest done on 07/15/2021 showed 3.4 x 2.9 cm spiculated mass consistent with malignancy.  Medical oncology and PCCM were consulted.  PCCM planning for bronchoscopy/PET scan as an outpatient.  History of COPD: Currently on steroids.  Continue as needed bronchodilators, incentive spirometry.  Acute metabolic encephalopathy/delirium: Likely from COVID.CT head did not show any acute intracranial findings.  Monitor mental status.  Delirium precautions  CKD stage II: Currently kidney function at baseline.  Hypertension: Currently BP stable.  On Norvasc 5 mg daily  Severe protein calorie malnutrition:  Dietitian following.  Debility/deconditioning: Seen by PT/OT and recommended home health on discharge.  She lives alone.  Her children live nearby     Nutrition Problem: Severe Malnutrition Etiology: chronic illness (COPD)      DVT prophylaxis:Lovenox Code Status: Full Family Communication: None at bedside Patient status:Inpatient  Dispo: The patient is from: Home              Anticipated d/c is to: Home              Anticipated d/c date is:in next 1-2 days  Consultants: PCCM  Procedures:None  Antimicrobials:  Anti-infectives (From admission, onward)    Start     Dose/Rate Route Frequency Ordered Stop   07/12/21 1000  remdesivir 100 mg in sodium chloride 0.9 % 100 mL IVPB       See Hyperspace for full Linked Orders Report.   100 mg 200 mL/hr over 30 Minutes Intravenous Daily 07/26/2021 1957 07/15/21 1035   08/08/2021 2000  remdesivir 200 mg in sodium chloride 0.9% 250 mL IVPB       See Hyperspace for full Linked Orders Report.   200 mg 580 mL/hr over 30 Minutes Intravenous Once 07/25/2021 1957 07/12/21 0144   07/20/2021 1915  cefTRIAXone (ROCEPHIN) 1 g in sodium chloride 0.9 % 100 mL IVPB        1 g 200 mL/hr over 30 Minutes Intravenous  Once 08/01/2021 1901 08/10/2021 2029   08/06/2021 1915  azithromycin (ZITHROMAX) 500 mg in sodium chloride 0.9 % 250 mL IVPB        500 mg 250 mL/hr over 60 Minutes Intravenous  Once 07/24/2021 1901 07/12/21 0059       Subjective:  Patient seen and examined at the bedside this morning.  Hemodynamically stable.  On bed, weak.  Not in respiratory distress, has some cough.  On 6 L of oxygen per minute.   Objective: Vitals:   07/17/21 1717 07/17/21 2100 07/18/21 0145 07/18/21 0442  BP: 99/65 127/90  124/72  Pulse: 90 93  82  Resp:    20  Temp: 97.6 F (36.4 C) 98 F (36.7 C)  97.7 F (36.5 C)  TempSrc:    Oral  SpO2: 95% 92% 94% 99%  Weight:      Height:        Intake/Output Summary (Last 24 hours) at 07/18/2021 1036 Last data filed at  07/18/2021 0458 Gross per 24 hour  Intake --  Output 900 ml  Net -900 ml   Filed Weights   07/20/2021 1706  Weight: 45 kg    Examination:  General exam: Overall comfortable, not in distress, pleasant elderly female, very deconditioned HEENT: PERRL Respiratory system: Diminished air sounds bilaterally, no wheezes or crackles  Cardiovascular system: S1 & S2 heard, RRR.  Gastrointestinal system: Abdomen is nondistended, soft and nontender. Central nervous system: Alert and oriented Extremities: No edema, no clubbing ,no cyanosis Skin: No rashes, no ulcers,no icterus      Data Reviewed: I have personally reviewed following labs and imaging studies  CBC: Recent Labs  Lab 07/12/21 0829 07/13/21 0706 07/14/21 0341 07/15/21 0446 07/15/21 1727 07/16/21 0521  WBC 2.0* 1.0* 1.5* 1.7* 5.6 4.7  NEUTROABS 1.0* 0.6* 0.6* 1.0*  --  3.6  HGB 11.1* 12.2 11.1* 11.2* 12.5 11.2*  HCT 32.9* 35.5* 33.2* 32.4* 36.1 33.2*  MCV 93.7 93.2 92.0 89.8 90.7 91.5  PLT 105* 136* 153 186 203 563   Basic Metabolic Panel: Recent Labs  Lab 07/24/2021 2029 07/12/21 0829 07/15/21 0446 07/15/21 1727 07/16/21 0521 07/17/21 0652 07/18/21 0559  NA  --    < > 137 135 134* 136 138  K  --    < > 3.4* 3.6 3.8 4.0 4.0  CL  --    < > 100 99 101 100 101  CO2  --    < > 27 28 28 28 31   GLUCOSE  --    < > 125* 137* 100* 138* 134*  BUN  --    < > 44* 43* 40* 42* 41*  CREATININE  --    < > 1.34* 1.23* 1.19* 1.24* 1.22*  CALCIUM  --    < > 9.0 9.5 8.9 8.8* 8.8*  MG 1.3*  --   --   --   --   --   --    < > = values in this interval not displayed.   GFR: Estimated Creatinine Clearance: 22.2 mL/min (A) (by C-G formula based on SCr of 1.22 mg/dL (H)). Liver Function Tests: Recent Labs  Lab 07/14/21 0341 07/15/21 0446 07/16/21 0521 07/17/21 0652 07/18/21 0559  AST 22 26 21 19 18   ALT 11 14 14 17 17   ALKPHOS 44 46 44 48 47  BILITOT 0.5 0.6 0.6 0.6 0.7  PROT 5.6* 5.6* 4.7* 5.1* 5.2*  ALBUMIN 3.1* 3.3* 3.0*  3.1* 3.1*   No results for input(s): LIPASE, AMYLASE in the last 168 hours. No results for input(s): AMMONIA in the last 168 hours. Coagulation Profile: No results for input(s): INR, PROTIME in the last 168 hours. Cardiac Enzymes: No results for input(s): CKTOTAL, CKMB, CKMBINDEX, TROPONINI in the last 168 hours. BNP (last 3 results) No results  for input(s): PROBNP in the last 8760 hours. HbA1C: No results for input(s): HGBA1C in the last 72 hours. CBG: No results for input(s): GLUCAP in the last 168 hours. Lipid Profile: No results for input(s): CHOL, HDL, LDLCALC, TRIG, CHOLHDL, LDLDIRECT in the last 72 hours. Thyroid Function Tests: No results for input(s): TSH, T4TOTAL, FREET4, T3FREE, THYROIDAB in the last 72 hours. Anemia Panel: Recent Labs    07/16/21 0521  FERRITIN 212   Sepsis Labs: Recent Labs  Lab 07/17/2021 2029  PROCALCITON 0.38  LATICACIDVEN 1.7    Recent Results (from the past 240 hour(s))  Resp Panel by RT-PCR (Flu A&B, Covid) Nasopharyngeal Swab     Status: Abnormal   Collection Time: 07/18/2021  5:23 PM   Specimen: Nasopharyngeal Swab; Nasopharyngeal(NP) swabs in vial transport medium  Result Value Ref Range Status   SARS Coronavirus 2 by RT PCR POSITIVE (A) NEGATIVE Final    Comment: (NOTE) SARS-CoV-2 target nucleic acids are DETECTED.  The SARS-CoV-2 RNA is generally detectable in upper respiratory specimens during the acute phase of infection. Positive results are indicative of the presence of the identified virus, but do not rule out bacterial infection or co-infection with other pathogens not detected by the test. Clinical correlation with patient history and other diagnostic information is necessary to determine patient infection status. The expected result is Negative.  Fact Sheet for Patients: EntrepreneurPulse.com.au  Fact Sheet for Healthcare Providers: IncredibleEmployment.be  This test is not yet  approved or cleared by the [place] FDA and  has been authorized for detection and/or diagnosis of SARS-CoV-2 by FDA under an Emergency Use Authorization (EUA).  This EUA will remain in effect (meaning this test can be used) for the duration of  the COVID-19 declaration under Section 564(b)(1) of the A ct, 21 U.S.C. section 360bbb-3(b)(1), unless the authorization is terminated or revoked sooner.     Influenza A by PCR NEGATIVE NEGATIVE Final   Influenza B by PCR NEGATIVE NEGATIVE Final    Comment: (NOTE) The Xpert Xpress SARS-CoV-2/FLU/RSV plus assay is intended as an aid in the diagnosis of influenza from Nasopharyngeal swab specimens and should not be used as a sole basis for treatment. Nasal washings and aspirates are unacceptable for Xpert Xpress SARS-CoV-2/FLU/RSV testing.  Fact Sheet for Patients: EntrepreneurPulse.com.au  Fact Sheet for Healthcare Providers: IncredibleEmployment.be  This test is not yet approved or cleared by the [place] FDA and has been authorized for detection and/or diagnosis of SARS-CoV-2 by FDA under an Emergency Use Authorization (EUA). This EUA will remain in effect (meaning this test can be used) for the duration of the COVID-19 declaration under Section 564(b)(1) of the Act, 21 U.S.C. section 360bbb-3(b)(1), unless the authorization is terminated or revoked.  Performed at [facility], [address] [place], [place] [id number]          Radiology Studies: CT HEAD WO CONTRAST (5MM)  Result Date: 07/16/2021 CLINICAL DATA:  Delirium EXAM: CT HEAD WITHOUT CONTRAST TECHNIQUE: Contiguous axial images were obtained from the base of the skull through the vertex without intravenous contrast. COMPARISON:  None. FINDINGS: Brain: No evidence of acute infarction, hemorrhage, cerebral edema, mass, mass effect, or midline shift. Arachnoid cyst anterior to the left temporal lobe. No hydrocephalus or  acute extra-axial fluid collection. Vascular: No hyperdense vessel. Skull: Normal. Negative for fracture or focal lesion. Sinuses/Orbits: No acute finding. Status post bilateral lens replacements. Other: The mastoid air cells are well aerated. IMPRESSION: IMPRESSION No acute intracranial process. Electronically Signed  By: Merilyn Baba M.D.   On: 07/16/2021 11:44        Scheduled Meds:  amLODipine  5 mg Oral Daily   vitamin C  500 mg Oral Daily   calcium-vitamin D  2 tablet Oral Daily   cholecalciferol  1,000 Units Oral Daily   dexamethasone (DECADRON) injection  4 mg Intravenous Q24H   enoxaparin (LOVENOX) injection  30 mg Subcutaneous Q24H   famotidine  20 mg Oral Daily   feeding supplement  1 Container Oral TID BM   guaiFENesin  600 mg Oral BID   Ipratropium-Albuterol  2 puff Inhalation QID   melatonin  2.5 mg Oral QHS   multivitamin with minerals  1 tablet Oral Daily   zinc sulfate  220 mg Oral Daily   Continuous Infusions:   LOS: 7 days    Time spent: 25 mins,More than 50% of that time was spent in counseling and/or coordination of care.      Shelly Coss, MD [facility] P1/02/2022, 10:36 AM

## 2021-07-18 NOTE — Progress Notes (Signed)
CCMD reports SVT with a rate of 191. Patient is currently in NSR with a rate of 76. Provider is notified of the occurrence. No new orders at this time. Will continue to monitor.

## 2021-07-19 DIAGNOSIS — U071 COVID-19: Secondary | ICD-10-CM | POA: Diagnosis not present

## 2021-07-19 DIAGNOSIS — J9601 Acute respiratory failure with hypoxia: Secondary | ICD-10-CM | POA: Diagnosis not present

## 2021-07-19 MED ORDER — GUAIFENESIN-DM 100-10 MG/5ML PO SYRP
10.0000 mL | ORAL_SOLUTION | Freq: Four times a day (QID) | ORAL | Status: DC
  Administered 2021-07-19 – 2021-07-28 (×25): 10 mL via ORAL
  Filled 2021-07-19 (×26): qty 10

## 2021-07-19 MED ORDER — DILTIAZEM HCL-DEXTROSE 125-5 MG/125ML-% IV SOLN (PREMIX)
5.0000 mg/h | INTRAVENOUS | Status: DC
  Administered 2021-07-19 – 2021-07-20 (×2): 5 mg/h via INTRAVENOUS
  Filled 2021-07-19 (×2): qty 125

## 2021-07-19 NOTE — Progress Notes (Signed)
PROGRESS NOTE    [name] [name]  ZOX:[id number] DOB: 24-Jan-1930 DOA: 07/23/2021 PCP: Jerrol Banana., MD   Chief Complain: Worsening dyspnea  Brief Narrative: Patient is a 88 year old female with history of asthma/COPD, hypertension, paroxysmal A. fib who presented here from home with complaints of acute onset of worsening dyspnea, poor oral intake, cough, confusion.  On presentation, she was hypoxic on room air and had to be placed on 10 L of oxygen.  Chest x-ray showed opacity in the left midlung concerning for pneumonia, emphysema.  COVID screen test came out  to be positive.  Hospital course remarkable for worsening confusion, hypoxia.  Overall status was improving, nearing to discharge.  In the morning of 07/19/2021, she went into A. fib with RVR requiring Cardizem drip.  Assessment & Plan:   Principal Problem:   Acute hypoxemic respiratory failure due to COVID-19 [facility]) Active Problems:   Protein-calorie malnutrition, severe  Acute hypoxic respiratory failure secondary to COVID infection/COPD exacerbation: Presented with dyspnea, cough, confusion.  COVID screen test found to be positive.  Elevated D-dimer.  Completed baricitinib on 1/5.  Started on steroids and remdesivir( completed course). Try to wean the oxygen.She will most likely qualify for home oxygen.  She was on 4 L this morning. PCCM was following.  Left upper lobe lung mass: CT chest done on 07/15/2021 showed 3.4 x 2.9 cm spiculated mass consistent with malignancy.  Medical oncology and PCCM were consulted.  PCCM planning for bronchoscopy/PET scan as an outpatient.  Paroxysmal A. fib: Has history of A. fib, not on anticoagulation due to advanced age, history of rectal bleeding,risk of falls  This morning she went into A. fib with RVR with heart rate in the range of 170s.  Started on Cardizem drip.  A. fib was triggered most likely secondary to respiratory issues.  History of COPD: Currently on steroids.  Continue as  needed bronchodilators, incentive spirometry.  Acute metabolic encephalopathy/delirium: Likely from COVID.CT head did not show any acute intracranial findings.  Monitor mental status.  Delirium precautions.  Currently alert and oriented.  CKD stage II: Currently kidney function at baseline.  Hypertension: Currently BP stable.  On Norvasc 5 mg daily  Severe protein calorie malnutrition: Dietitian following.  Debility/deconditioning: Seen by PT/OT and recommended home health on discharge.  She lives alone.  Her children live nearby.  PT/OT reevaluation requested due to significant decline.  Goals of care: Very elderly patient with multiple comorbidities now with left upper lobe lung mass with possible malignancy.  Palliative care consulted for goals of care.     Nutrition Problem: Severe Malnutrition Etiology: chronic illness (COPD)      DVT prophylaxis:Lovenox Code Status: Full Family Communication: Called and discussed with daughter on phone on 07/19/2021 Patient status:Inpatient  Dispo: The patient is from: Home              Anticipated d/c is to: Home vs SnF              Anticipated d/c date is: Not sure at present  Consultants: PCCM  Procedures:None  Antimicrobials:  Anti-infectives (From admission, onward)    Start     Dose/Rate Route Frequency Ordered Stop   07/12/21 1000  remdesivir 100 mg in sodium chloride 0.9 % 100 mL IVPB       See Hyperspace for full Linked Orders Report.   100 mg 200 mL/hr over 30 Minutes Intravenous Daily 07/23/2021 1957 07/15/21 1035   07/12/2021 2000  remdesivir 200 mg in  sodium chloride 0.9% 250 mL IVPB       See Hyperspace for full Linked Orders Report.   200 mg 580 mL/hr over 30 Minutes Intravenous Once 07/24/2021 1957 07/12/21 0144   08/08/2021 1915  cefTRIAXone (ROCEPHIN) 1 g in sodium chloride 0.9 % 100 mL IVPB        1 g 200 mL/hr over 30 Minutes Intravenous  Once 08/04/2021 1901 07/30/2021 2029   07/17/2021 1915  azithromycin (ZITHROMAX) 500  mg in sodium chloride 0.9 % 250 mL IVPB        500 mg 250 mL/hr over 60 Minutes Intravenous  Once 07/24/2021 1901 07/12/21 0059       Subjective:  Patient seen and examined at the bedside this morning.  Hemodynamically stable.  On 4 L of oxygen and when I saw her in the morning.  She was sitting at the edge of the bed.  Very weak,, coughing.  At around 11:30 AM, she went into A. fib with RVR.  She was asymptomatic but her heart rate was in the range of 170.  Objective: Vitals:   07/18/21 1937 07/19/21 0350 07/19/21 0850 07/19/21 1119  BP: 139/70 (!) 125/53 (!) 141/71 109/74  Pulse: 95 89 91 (!) 167  Resp:  18 18 20   Temp: 98.1 F (36.7 C) 98.6 F (37 C) 97.6 F (36.4 C)   TempSrc:      SpO2: 97% 94% 95% 98%  Weight:      Height:        Intake/Output Summary (Last 24 hours) at 07/19/2021 1344 Last data filed at 07/18/2021 1938 Gross per 24 hour  Intake --  Output 100 ml  Net -100 ml   Filed Weights   07/27/2021 1706  Weight: 45 kg    Examination:   General exam: Very deconditioned, hard of hearing, chronically looking, malnourished HEENT: PERRL Respiratory system: Diminished air sounds bilaterally, no wheezes or crackles  Cardiovascular system: Irregularly irregular rhythm.  Gastrointestinal system: Abdomen is nondistended, soft and nontender. Central nervous system: Alert and oriented Extremities: No edema, no clubbing ,no cyanosis Skin: No rashes, no ulcers,no icterus    Data Reviewed: I have personally reviewed following labs and imaging studies  CBC: Recent Labs  Lab 07/13/21 0706 07/14/21 0341 07/15/21 0446 07/15/21 1727 07/16/21 0521  WBC 1.0* 1.5* 1.7* 5.6 4.7  NEUTROABS 0.6* 0.6* 1.0*  --  3.6  HGB 12.2 11.1* 11.2* 12.5 11.2*  HCT 35.5* 33.2* 32.4* 36.1 33.2*  MCV 93.2 92.0 89.8 90.7 91.5  PLT 136* 153 186 203 235   Basic Metabolic Panel: Recent Labs  Lab 07/15/21 0446 07/15/21 1727 07/16/21 0521 07/17/21 0652 07/18/21 0559  NA 137 135 134*  136 138  K 3.4* 3.6 3.8 4.0 4.0  CL 100 99 101 100 101  CO2 27 28 28 28 31   GLUCOSE 125* 137* 100* 138* 134*  BUN 44* 43* 40* 42* 41*  CREATININE 1.34* 1.23* 1.19* 1.24* 1.22*  CALCIUM 9.0 9.5 8.9 8.8* 8.8*   GFR: Estimated Creatinine Clearance: 22.2 mL/min (A) (by C-G formula based on SCr of 1.22 mg/dL (H)). Liver Function Tests: Recent Labs  Lab 07/14/21 0341 07/15/21 0446 07/16/21 0521 07/17/21 0652 07/18/21 0559  AST 22 26 21 19 18   ALT 11 14 14 17 17   ALKPHOS 44 46 44 48 47  BILITOT 0.5 0.6 0.6 0.6 0.7  PROT 5.6* 5.6* 4.7* 5.1* 5.2*  ALBUMIN 3.1* 3.3* 3.0* 3.1* 3.1*   No results for input(s): LIPASE, AMYLASE  in the last 168 hours. No results for input(s): AMMONIA in the last 168 hours. Coagulation Profile: No results for input(s): INR, PROTIME in the last 168 hours. Cardiac Enzymes: No results for input(s): CKTOTAL, CKMB, CKMBINDEX, TROPONINI in the last 168 hours. BNP (last 3 results) No results for input(s): PROBNP in the last 8760 hours. HbA1C: No results for input(s): HGBA1C in the last 72 hours. CBG: No results for input(s): GLUCAP in the last 168 hours. Lipid Profile: No results for input(s): CHOL, HDL, LDLCALC, TRIG, CHOLHDL, LDLDIRECT in the last 72 hours. Thyroid Function Tests: No results for input(s): TSH, T4TOTAL, FREET4, T3FREE, THYROIDAB in the last 72 hours. Anemia Panel: No results for input(s): VITAMINB12, FOLATE, FERRITIN, TIBC, IRON, RETICCTPCT in the last 72 hours.  Sepsis Labs: No results for input(s): PROCALCITON, LATICACIDVEN in the last 168 hours.   Recent Results (from the past 240 hour(s))  Resp Panel by RT-PCR (Flu A&B, Covid) Nasopharyngeal Swab     Status: Abnormal   Collection Time: 07/14/2021  5:23 PM   Specimen: Nasopharyngeal Swab; Nasopharyngeal(NP) swabs in vial transport medium  Result Value Ref Range Status   SARS Coronavirus 2 by RT PCR POSITIVE (A) NEGATIVE Final    Comment: (NOTE) SARS-CoV-2 target nucleic acids are  DETECTED.  The SARS-CoV-2 RNA is generally detectable in upper respiratory specimens during the acute phase of infection. Positive results are indicative of the presence of the identified virus, but do not rule out bacterial infection or co-infection with other pathogens not detected by the test. Clinical correlation with patient history and other diagnostic information is necessary to determine patient infection status. The expected result is Negative.  Fact Sheet for Patients: EntrepreneurPulse.com.au  Fact Sheet for Healthcare Providers: IncredibleEmployment.be  This test is not yet approved or cleared by the [place] FDA and  has been authorized for detection and/or diagnosis of SARS-CoV-2 by FDA under an Emergency Use Authorization (EUA).  This EUA will remain in effect (meaning this test can be used) for the duration of  the COVID-19 declaration under Section 564(b)(1) of the A ct, 21 U.S.C. section 360bbb-3(b)(1), unless the authorization is terminated or revoked sooner.     Influenza A by PCR NEGATIVE NEGATIVE Final   Influenza B by PCR NEGATIVE NEGATIVE Final    Comment: (NOTE) The Xpert Xpress SARS-CoV-2/FLU/RSV plus assay is intended as an aid in the diagnosis of influenza from Nasopharyngeal swab specimens and should not be used as a sole basis for treatment. Nasal washings and aspirates are unacceptable for Xpert Xpress SARS-CoV-2/FLU/RSV testing.  Fact Sheet for Patients: EntrepreneurPulse.com.au  Fact Sheet for Healthcare Providers: IncredibleEmployment.be  This test is not yet approved or cleared by the [place] FDA and has been authorized for detection and/or diagnosis of SARS-CoV-2 by FDA under an Emergency Use Authorization (EUA). This EUA will remain in effect (meaning this test can be used) for the duration of the COVID-19 declaration under Section 564(b)(1) of the Act, 21  U.S.C. section 360bbb-3(b)(1), unless the authorization is terminated or revoked.  Performed at [facility], [address] [place], [place] [address]          Radiology Studies: No results found.      Scheduled Meds:  amLODipine  5 mg Oral Daily   vitamin C  500 mg Oral Daily   calcium-vitamin D  2 tablet Oral Daily   cholecalciferol  1,000 Units Oral Daily   enoxaparin (LOVENOX) injection  30 mg Subcutaneous Q24H   famotidine  20 mg Oral Daily   feeding supplement  1 Container Oral TID BM   guaiFENesin  600 mg Oral BID   Ipratropium-Albuterol  2 puff Inhalation QID   melatonin  2.5 mg Oral QHS   multivitamin with minerals  1 tablet Oral Daily   [START ON 07/22/2021] predniSONE  10 mg Oral Q breakfast   predniSONE  20 mg Oral Q breakfast   zinc sulfate  220 mg Oral Daily   Continuous Infusions:  diltiazem (CARDIZEM) infusion       LOS: 8 days    Time spent: 25 mins,More than 50% of that time was spent in counseling and/or coordination of care.      Shelly Coss, MD [facility] P1/03/2022, 1:44 PM

## 2021-07-19 NOTE — Progress Notes (Signed)
PT Cancellation Note  Patient Details Name: [name] [name] MRN: [id number] DOB: 1929/08/07   Cancelled Treatment:    Reason Eval/Treat Not Completed: Medical issues which prohibited therapy (Per chart reviewed, patient with elevated HR (140-170s) and planning transfer to progressive care for cardizem drip; contraindicated for exertional activity at this time.  Will continue to follow and re-initiate as medically appropriate.)   Yassine Brunsman H. Owens Shark, PT, DPT, NCS 07/19/21, 11:54 AM [phone]

## 2021-07-19 NOTE — Progress Notes (Signed)
Order received from Dr Tawanna Solo to transfer the patient to progressive for cardizem drip

## 2021-07-19 NOTE — TOC Progression Note (Signed)
Transition of Care [facility]) - Progression Note    Patient Details  Name: [name] [name] MRN: [id number] Date of Birth: December 15, 1929  [facility]) CM/SW Contact  Beverly Sessions, RN Phone Number: 07/19/2021, 12:36 PM  Clinical Narrative:     Plan for transfer to progressive care unit with elevated heart rate.  Patient still requiring acute O2   Expected Discharge Plan: [place] Barriers to Discharge: Continued Medical Work up  Expected Discharge Plan and Services Expected Discharge Plan: [place]   Discharge Planning Services: CM Consult Post Acute Care Choice: [place] arrangements for the past 2 months: Single Family Home                           HH Arranged: RN, PT [facility] Agency: [place] ([place]) Date [place]: 07/13/21   Representative spoke with at [place]: [place] ([place]) Interventions    Readmission Risk Interventions No flowsheet data found.

## 2021-07-19 NOTE — Progress Notes (Addendum)
OT Cancellation Note  Patient Details Name: [name] [name] MRN: [id number] DOB: [birth year]   Cancelled Treatment:    Reason Eval/Treat Not Completed: Medical issues which prohibited therapy. Chart reviewed, telemetry noted with HR in 140's-170's at rest. Planning to transfer to progressive care for cardizem drip. Will hold OT tx at this time and re-attempt at later date/time as medically appropriate.   Ardeth Perfect., MPH, MS, OTR/L ascom [phone] 07/19/21, 11:50 AM

## 2021-07-20 ENCOUNTER — Inpatient Hospital Stay: Payer: Medicare Other

## 2021-07-20 DIAGNOSIS — U071 COVID-19: Secondary | ICD-10-CM | POA: Diagnosis not present

## 2021-07-20 DIAGNOSIS — J9601 Acute respiratory failure with hypoxia: Secondary | ICD-10-CM | POA: Diagnosis not present

## 2021-07-20 LAB — CBC WITH DIFFERENTIAL/PLATELET
Abs Immature Granulocytes: 0.14 10*3/uL — ABNORMAL HIGH (ref 0.00–0.07)
Basophils Absolute: 0 10*3/uL (ref 0.0–0.1)
Basophils Relative: 0 %
Eosinophils Absolute: 0.1 10*3/uL (ref 0.0–0.5)
Eosinophils Relative: 1 %
HCT: 38.8 % (ref 36.0–46.0)
Hemoglobin: 12.6 g/dL (ref 12.0–15.0)
Immature Granulocytes: 2 %
Lymphocytes Relative: 20 %
Lymphs Abs: 1.6 10*3/uL (ref 0.7–4.0)
MCH: 31.3 pg (ref 26.0–34.0)
MCHC: 32.5 g/dL (ref 30.0–36.0)
MCV: 96.3 fL (ref 80.0–100.0)
Monocytes Absolute: 0.7 10*3/uL (ref 0.1–1.0)
Monocytes Relative: 9 %
Neutro Abs: 5.5 10*3/uL (ref 1.7–7.7)
Neutrophils Relative %: 68 %
Platelets: 357 10*3/uL (ref 150–400)
RBC: 4.03 MIL/uL (ref 3.87–5.11)
RDW: 13.2 % (ref 11.5–15.5)
WBC: 8 10*3/uL (ref 4.0–10.5)
nRBC: 0 % (ref 0.0–0.2)

## 2021-07-20 LAB — BASIC METABOLIC PANEL
Anion gap: 11 (ref 5–15)
BUN: 30 mg/dL — ABNORMAL HIGH (ref 8–23)
CO2: 28 mmol/L (ref 22–32)
Calcium: 9.1 mg/dL (ref 8.9–10.3)
Chloride: 102 mmol/L (ref 98–111)
Creatinine, Ser: 0.96 mg/dL (ref 0.44–1.00)
GFR, Estimated: 57 mL/min — ABNORMAL LOW (ref >60)
Glucose, Bld: 104 mg/dL — ABNORMAL HIGH (ref 70–99)
Potassium: 3.8 mmol/L (ref 3.5–5.1)
Sodium: 141 mmol/L (ref 135–145)

## 2021-07-20 MED ORDER — DILTIAZEM HCL ER COATED BEADS 120 MG PO CP24: 120.0000 mg | ORAL_CAPSULE | Freq: Every day | ORAL | Status: DC

## 2021-07-20 MED ORDER — DILTIAZEM HCL-DEXTROSE 125-5 MG/125ML-% IV SOLN (PREMIX)
5.0000 mg/h | INTRAVENOUS | Status: DC
  Administered 2021-07-20: 10 mg/h via INTRAVENOUS

## 2021-07-20 NOTE — Progress Notes (Signed)
PT Cancellation Note  Patient Details Name: [name] [name] MRN: [id number] DOB: Apr 18, 1930   Cancelled Treatment:    Reason Eval/Treat Not Completed: Patient not medically ready.  Chart reviewed.  New PT consult received after pt transferred to progressive care unit yesterday (d/t elevated HR and need for cardizem drip).  Pt still on cardizem drip for HR stabilization.  Nurse recommending holding therapy at this time d/t HR concerns with activity.  Will re-attempt PT session at a later date/time as medically appropriate.  Leitha Bleak, PT 07/20/21, 3:33 PM

## 2021-07-20 NOTE — Progress Notes (Signed)
PROGRESS NOTE    [name] [name]  HWT:[id number] DOB: May 26, 1930 DOA: 08/09/2021 PCP: Jerrol Banana., MD   Chief Complain: Worsening dyspnea  Brief Narrative: Patient is a 88 year old female with history of asthma/COPD, hypertension, paroxysmal A. fib who presented here from home with complaints of acute onset of worsening dyspnea, poor oral intake, cough, confusion.  On presentation, she was hypoxic on room air and had to be placed on 10 L of oxygen.  Chest x-ray showed opacity in the left midlung concerning for pneumonia, emphysema.  COVID screen test came out  to be positive.  Hospital course remarkable for worsening confusion, hypoxia.  Overall status was improving, nearing to discharge.  But in the morning of 07/19/2021, she went into A. fib with RVR requiring Cardizem drip.    Assessment & Plan:   Principal Problem:   Acute hypoxemic respiratory failure due to COVID-19 [facility]) Active Problems:   Protein-calorie malnutrition, severe  Acute hypoxic respiratory failure secondary to COVID infection/COPD exacerbation: Presented with dyspnea, cough, confusion.  COVID screen test found to be positive.  Elevated D-dimer.  Completed baricitinib on 1/5.  Started on steroids and remdesivir( completed course). Trying  to wean the oxygen.She will most likely qualify for home oxygen.  She was on 2 L this morning. PCCM was following.  Left upper lobe lung mass: CT chest done on 07/15/2021 showed 3.4 x 2.9 cm spiculated mass consistent with malignancy.  Medical oncology and PCCM were consulted.  PCCM planning for bronchoscopy/PET scan as an outpatient.  Paroxysmal A. fib: Has history of A. fib, not on anticoagulation due to advanced age, history of rectal bleeding,risk of falls  She went into A. fib with RVR with heart rate in the range of 170s on 1/9.  Started on Cardizem drip.  A. fib was triggered most likely secondary to respiratory issues. Heart rate this morning was well controlled,drip was  stopped and started on oral Cardizem but went into rapid A. fib again and had to be started back on drip.  Heart rate this afternoon is well controlled, will continue drip for today, can change to oral Cardizem if heart rate remains stable tomorrow  History of COPD: Currently on steroids.  Continue as needed bronchodilators, incentive spirometry.  Acute metabolic encephalopathy/delirium: Likely exacerbated from [place].CT head did not show any acute intracranial findings.  Monitor mental status.  Delirium precautions.  Currently alert and oriented.  CKD stage II: Currently kidney function at baseline.  Hypertension: Currently BP stable.  Severe protein calorie malnutrition: Dietitian following.  Debility/deconditioning: Seen by PT/OT and recommended home health on discharge.  She lives alone.  Her children live nearby.  PT/OT reevaluation requested due to significant decline.  Goals of care: Very elderly patient with multiple comorbidities now with left upper lobe lung mass with possible malignancy.  Palliative care consulted for goals of care.Partial code.  Talked with daughter on 1/9 about CODE STATUS and states she will discuss with family and get back to [place]     Nutrition Problem: Severe Malnutrition Etiology: chronic illness (COPD)      DVT prophylaxis:Lovenox Code Status: Full Family Communication: Called and discussed with daughter on phone on 07/19/2021.  Called again today, call not received Patient status:Inpatient  Dispo: The patient is from: Home              Anticipated d/c is to: Home vs SnF              Anticipated d/c date is: In  next 2 to 3 days if remains hemodynamically stable  Consultants: PCCM  Procedures:None  Antimicrobials:  Anti-infectives (From admission, onward)    Start     Dose/Rate Route Frequency Ordered Stop   07/12/21 1000  remdesivir 100 mg in sodium chloride 0.9 % 100 mL IVPB       See Hyperspace for full Linked Orders Report.   100 mg 200  mL/hr over 30 Minutes Intravenous Daily 07/22/2021 1957 07/15/21 1035   07/29/2021 2000  remdesivir 200 mg in sodium chloride 0.9% 250 mL IVPB       See Hyperspace for full Linked Orders Report.   200 mg 580 mL/hr over 30 Minutes Intravenous Once 08/05/2021 1957 07/12/21 0144   07/28/2021 1915  cefTRIAXone (ROCEPHIN) 1 g in sodium chloride 0.9 % 100 mL IVPB        1 g 200 mL/hr over 30 Minutes Intravenous  Once 08/09/2021 1901 07/22/2021 2029   07/27/2021 1915  azithromycin (ZITHROMAX) 500 mg in sodium chloride 0.9 % 250 mL IVPB        500 mg 250 mL/hr over 60 Minutes Intravenous  Once 07/16/2021 1901 07/12/21 0059       Subjective:  Patient seen and examined at the bedside this morning.  Early this morning, her heart rate was well controlled but when I examined her in the room she was in A. fib with RVR.  She was coughing.  She was on 2 L of oxygen per minute.  Objective: Vitals:   07/19/21 1742 07/19/21 1940 07/20/21 0021 07/20/21 0425  BP: (!) 144/56 132/65 127/73 (!) 143/64  Pulse:  90 71 70  Resp: 16 20 20 20   Temp:  98.1 F (36.7 C) 98.2 F (36.8 C) 98.3 F (36.8 C)  TempSrc:      SpO2: 98% 98% 97% 96%  Weight:      Height:        Intake/Output Summary (Last 24 hours) at 07/20/2021 0816 Last data filed at 07/20/2021 0400 Gross per 24 hour  Intake 68.97 ml  Output --  Net 68.97 ml   Filed Weights   07/22/2021 1706  Weight: 45 kg    Examination:   General exam: Very deconditioned, chronically ill looking, weak, malnourished, hard of hearing HEENT: PERRL Respiratory system: Diminished air sounds bilaterally Cardiovascular system: A. fib with RVR Gastrointestinal system: Abdomen is nondistended, soft and nontender. Central nervous system: Alert and oriented Extremities: No edema, no clubbing ,no cyanosis Skin: No rashes, no ulcers,no icterus    Data Reviewed: I have personally reviewed following labs and imaging studies  CBC: Recent Labs  Lab 07/14/21 0341 07/15/21 0446  07/15/21 1727 07/16/21 0521 07/20/21 0748  WBC 1.5* 1.7* 5.6 4.7 8.0  NEUTROABS 0.6* 1.0*  --  3.6 5.5  HGB 11.1* 11.2* 12.5 11.2* 12.6  HCT 33.2* 32.4* 36.1 33.2* 38.8  MCV 92.0 89.8 90.7 91.5 96.3  PLT 153 186 203 180 540   Basic Metabolic Panel: Recent Labs  Lab 07/15/21 0446 07/15/21 1727 07/16/21 0521 07/17/21 0652 07/18/21 0559  NA 137 135 134* 136 138  K 3.4* 3.6 3.8 4.0 4.0  CL 100 99 101 100 101  CO2 27 28 28 28 31   GLUCOSE 125* 137* 100* 138* 134*  BUN 44* 43* 40* 42* 41*  CREATININE 1.34* 1.23* 1.19* 1.24* 1.22*  CALCIUM 9.0 9.5 8.9 8.8* 8.8*   GFR: Estimated Creatinine Clearance: 22.2 mL/min (A) (by C-G formula based on SCr of 1.22 mg/dL (H)). Liver Function  Tests: Recent Labs  Lab 07/14/21 0341 07/15/21 0446 07/16/21 0521 07/17/21 0652 07/18/21 0559  AST 22 26 21 19 18   ALT 11 14 14 17 17   ALKPHOS 44 46 44 48 47  BILITOT 0.5 0.6 0.6 0.6 0.7  PROT 5.6* 5.6* 4.7* 5.1* 5.2*  ALBUMIN 3.1* 3.3* 3.0* 3.1* 3.1*   No results for input(s): LIPASE, AMYLASE in the last 168 hours. No results for input(s): AMMONIA in the last 168 hours. Coagulation Profile: No results for input(s): INR, PROTIME in the last 168 hours. Cardiac Enzymes: No results for input(s): CKTOTAL, CKMB, CKMBINDEX, TROPONINI in the last 168 hours. BNP (last 3 results) No results for input(s): PROBNP in the last 8760 hours. HbA1C: No results for input(s): HGBA1C in the last 72 hours. CBG: No results for input(s): GLUCAP in the last 168 hours. Lipid Profile: No results for input(s): CHOL, HDL, LDLCALC, TRIG, CHOLHDL, LDLDIRECT in the last 72 hours. Thyroid Function Tests: No results for input(s): TSH, T4TOTAL, FREET4, T3FREE, THYROIDAB in the last 72 hours. Anemia Panel: No results for input(s): VITAMINB12, FOLATE, FERRITIN, TIBC, IRON, RETICCTPCT in the last 72 hours.  Sepsis Labs: No results for input(s): PROCALCITON, LATICACIDVEN in the last 168 hours.   Recent Results (from the  past 240 hour(s))  Resp Panel by RT-PCR (Flu A&B, Covid) Nasopharyngeal Swab     Status: Abnormal   Collection Time: 07/26/2021  5:23 PM   Specimen: Nasopharyngeal Swab; Nasopharyngeal(NP) swabs in vial transport medium  Result Value Ref Range Status   SARS Coronavirus 2 by RT PCR POSITIVE (A) NEGATIVE Final    Comment: (NOTE) SARS-CoV-2 target nucleic acids are DETECTED.  The SARS-CoV-2 RNA is generally detectable in upper respiratory specimens during the acute phase of infection. Positive results are indicative of the presence of the identified virus, but do not rule out bacterial infection or co-infection with other pathogens not detected by the test. Clinical correlation with patient history and other diagnostic information is necessary to determine patient infection status. The expected result is Negative.  Fact Sheet for Patients: EntrepreneurPulse.com.au  Fact Sheet for Healthcare Providers: IncredibleEmployment.be  This test is not yet approved or cleared by the [place] FDA and  has been authorized for detection and/or diagnosis of SARS-CoV-2 by FDA under an Emergency Use Authorization (EUA).  This EUA will remain in effect (meaning this test can be used) for the duration of  the COVID-19 declaration under Section 564(b)(1) of the A ct, 21 U.S.C. section 360bbb-3(b)(1), unless the authorization is terminated or revoked sooner.     Influenza A by PCR NEGATIVE NEGATIVE Final   Influenza B by PCR NEGATIVE NEGATIVE Final    Comment: (NOTE) The Xpert Xpress SARS-CoV-2/FLU/RSV plus assay is intended as an aid in the diagnosis of influenza from Nasopharyngeal swab specimens and should not be used as a sole basis for treatment. Nasal washings and aspirates are unacceptable for Xpert Xpress SARS-CoV-2/FLU/RSV testing.  Fact Sheet for Patients: EntrepreneurPulse.com.au  Fact Sheet for Healthcare  Providers: IncredibleEmployment.be  This test is not yet approved or cleared by the [place] FDA and has been authorized for detection and/or diagnosis of SARS-CoV-2 by FDA under an Emergency Use Authorization (EUA). This EUA will remain in effect (meaning this test can be used) for the duration of the COVID-19 declaration under Section 564(b)(1) of the Act, 21 U.S.C. section 360bbb-3(b)(1), unless the authorization is terminated or revoked.  Performed at [facility], [address], [place], [place] [address]  Radiology Studies: No results found.      Scheduled Meds:  vitamin C  500 mg Oral Daily   calcium-vitamin D  2 tablet Oral Daily   cholecalciferol  1,000 Units Oral Daily   diltiazem  120 mg Oral Daily   enoxaparin (LOVENOX) injection  30 mg Subcutaneous Q24H   famotidine  20 mg Oral Daily   feeding supplement  1 Container Oral TID BM   guaiFENesin  600 mg Oral BID   guaiFENesin-dextromethorphan  10 mL Oral Q6H   Ipratropium-Albuterol  2 puff Inhalation QID   melatonin  2.5 mg Oral QHS   multivitamin with minerals  1 tablet Oral Daily   [START ON 07/22/2021] predniSONE  10 mg Oral Q breakfast   predniSONE  20 mg Oral Q breakfast   zinc sulfate  220 mg Oral Daily   Continuous Infusions:     LOS: 9 days    Time spent: 35 mins,More than 50% of that time was spent in counseling and/or coordination of care.      Shelly Coss, MD [facility] P1/04/2022, 8:16 AM

## 2021-07-20 NOTE — Progress Notes (Signed)
OT Cancellation Note  Patient Details Name: [name] [name] MRN: [id number] DOB: Jul 02, 1930   Cancelled Treatment:    Reason Eval/Treat Not Completed: Patient not medically ready. New consult received, chart reviewed. New OT consult received after pt transferred to progressive care unit yesterday (d/t elevated HR and need for cardizem drip).  Pt still on cardizem drip for HR stabilization.  Nurse recommending holding therapy at this time d/t HR concerns with activity.  Will re-attempt OT session at a later date/time as medically appropriate.  Ardeth Perfect., MPH, MS, OTR/L ascom [phone] 07/20/21, 4:28 PM

## 2021-07-20 NOTE — Progress Notes (Signed)
Nutrition Follow-up  DOCUMENTATION CODES:   Severe malnutrition in context of chronic illness  INTERVENTION:   -Continue Boost Breeze po TID, each supplement provides 250 kcal and 9 grams of protein  -Continue Magic cup TID with meals, each supplement provides 290 kcal and 9 grams of protein  -Continue MVI with minerals daily  NUTRITION DIAGNOSIS:   Severe Malnutrition related to chronic illness (COPD) as evidenced by estimated needs.  Ongoing  GOAL:   Patient will meet greater than or equal to 90% of their needs  Progressing   MONITOR:   PO intake, Supplement acceptance, Labs, Weight trends, Skin, I & O's  REASON FOR ASSESSMENT:   Consult Assessment of nutrition requirement/status  ASSESSMENT:   88 y/o female with h/o COPD, HTN, Barrett's esophagus, HLD and Afib who is admitted with COVID 19.  Reviewed I/O's: +69 ml x 24 hours and -1491 ml x 24 hours  Per pulmonology notes, pt with 3 cm lt upper lung mass; plan for bronchoscopy with lung biopsy after COVID-19 isolation. Oncology recommending biopsy and PET scan as an outpatient.   Pt now on a regular diet. No meal completion data available to assess at this time. Pt is consuming Boost Breeze supplements.    Medications reviewed and include vitamin C, vitamin D3, zinc sulfate, cardizem, and prednisone.   Palliative care consult pending for goals of care discussions.   Labs reviewed.   Diet Order:   Diet Order             Diet regular Room service appropriate? Yes; Fluid consistency: Thin  Diet effective now                   EDUCATION NEEDS:   Education needs have been addressed  Skin:  Skin Assessment: Reviewed RN Assessment  Last BM:  07/18/21  Height:   Ht Readings from Last 1 Encounters:  07/20/2021 5\' 3"  (1.6 m)    Weight:   Wt Readings from Last 1 Encounters:  07/25/2021 45 kg    Ideal Body Weight:  52.3 kg  BMI:  Body mass index is 17.59 kg/m.  Estimated Nutritional Needs:    Kcal:  1400-1600  Protein:  60-75 grams  Fluid:  > 1.4 L    Loistine Chance, RD, LDN, [place] Registered Dietitian II Certified Diabetes Care and Education Specialist Please refer to [facility] for RD and/or RD on-call/weekend/after hours pager

## 2021-07-21 DIAGNOSIS — C801 Malignant (primary) neoplasm, unspecified: Secondary | ICD-10-CM | POA: Diagnosis not present

## 2021-07-21 DIAGNOSIS — J9601 Acute respiratory failure with hypoxia: Secondary | ICD-10-CM | POA: Diagnosis not present

## 2021-07-21 DIAGNOSIS — R0602 Shortness of breath: Secondary | ICD-10-CM

## 2021-07-21 DIAGNOSIS — E43 Unspecified severe protein-calorie malnutrition: Secondary | ICD-10-CM | POA: Diagnosis not present

## 2021-07-21 DIAGNOSIS — Z515 Encounter for palliative care: Secondary | ICD-10-CM

## 2021-07-21 DIAGNOSIS — U071 COVID-19: Secondary | ICD-10-CM | POA: Diagnosis not present

## 2021-07-21 DIAGNOSIS — J189 Pneumonia, unspecified organism: Secondary | ICD-10-CM | POA: Diagnosis not present

## 2021-07-21 MED ORDER — DILTIAZEM HCL-DEXTROSE 125-5 MG/125ML-% IV SOLN (PREMIX)
5.0000 mg/h | INTRAVENOUS | Status: DC
  Administered 2021-07-21: 15 mg/h via INTRAVENOUS
  Administered 2021-07-22: 10 mg/h via INTRAVENOUS
  Filled 2021-07-21 (×2): qty 125

## 2021-07-21 MED ORDER — DILTIAZEM HCL 30 MG PO TABS
30.0000 mg | ORAL_TABLET | Freq: Four times a day (QID) | ORAL | Status: DC
  Administered 2021-07-21 (×2): 30 mg via ORAL
  Filled 2021-07-21 (×2): qty 1

## 2021-07-21 MED ORDER — HYDROCODONE-ACETAMINOPHEN 5-325 MG PO TABS
1.0000 | ORAL_TABLET | Freq: Four times a day (QID) | ORAL | Status: DC | PRN
  Administered 2021-07-21 – 2021-07-29 (×4): 1 via ORAL
  Filled 2021-07-21 (×4): qty 1

## 2021-07-21 NOTE — Progress Notes (Signed)
Progress Note:    [name] [name]    IOE:[id number] DOB: Jun 21, 1930 DOA: 08/04/2021  PCP: Jerrol Banana., MD    Brief Narrative:   This patient is a 88 year old female with history of asthma/COPD, hypertension, paroxysmal A. fib who presented here from home with complaints of acute onset of worsening dyspnea, poor oral intake, cough, confusion.  On presentation, she was hypoxic on room air and had to be placed on 10 L of oxygen.  Chest x-ray showed opacity in the left midlung concerning for pneumonia, emphysema.  COVID screen test came out  to be positive.  Hospital course remarkable for worsening confusion, hypoxia.  Overall status was improving, nearing to discharge.  But in the morning of 07/19/2021, she went into A. fib with RVR requiring Cardizem drip.      Subjective:   This morning she states her breathing is okay.  Currently on 3 L oxygen saturating in the mid 90s.  This not on oxygen at baseline.  Son is present at bedside upon my evaluation.  Still on diltiazem drip.  States she has a dry cough but it is improving.  Isolation precautions can be removed as she is 10 days out from her positive test date.  She can tell me her name she is in the month.  She thinks the year is 2033.  She can tell me who the president is.  Son is asking if we can resume her home Norco since she is having pain in her legs.  Son wants SNF rehab.  Assessment and Plan:   Acute hypoxic respiratory failure secondary to COVID-19 infection/COPD exacerbation: Currently on liters oxygen and saturating well.  COVID-19 test was positive on 07/13/2021.  D-dimer was elevated and has trended down but I have low suspicion for PE.  Completed baricitinib on 1/5.  Course of remdesivir.  Now on oral steroids.  PCCM consulted and following loosely.  Discontinue isolation precautions.  Left upper lobe lung mass: CT chest done on 07/15/2021 showed 3.4 x 2.9 cm spiculated mass consistent with malignancy.  Medical  oncology and PCCM were consulted.  PCCM planning for bronchoscopy/PET scan as an outpatient.   Paroxysmal A. fib: Has history of A. fib, not on anticoagulation due to advanced age, history of rectal bleeding,risk of falls  She went into A. fib with RVR with heart rate in the range of 170s on 1/9.  Started on Cardizem drip.  A. fib was triggered most likely secondary to respiratory issues. Will trial off diltiazem drip and start short acting Cardizem. TSH ordered with AM labs.  History of COPD: Currently on steroids.  Continue as needed bronchodilators, incentive spirometry.   Acute metabolic encephalopathy/delirium: Likely exacerbated from [place].CT head did not show any acute intracranial findings.  Monitor mental status.  Delirium precautions.  Currently alert and oriented x3.   CKD stage II: Currently kidney function at baseline.   Hypertension: Currently BP stable.  Holding home Hyzaar.   Severe protein calorie malnutrition: Dietitian following   Debility/deconditioning: Seen by PT/OT and recommended home health on discharge.  She lives alone.  Her children live nearby.  PT/OT reevaluation requested due to significant decline.  Son wants SNF rehab.  [facility].   Goals of care: Very elderly patient with multiple comorbidities now with left upper lobe lung mass with possible malignancy.  Palliative care consulted for goals of care.Partial code.    Other information:    DVT prophylaxis: Lovenox  subcu Code Status: Partial code Family Communication: Son was at side upon my evaluation Disposition:   Status is: Inpatient  Remains inpatient appropriate because: Hypoxic and on diltiazem drip     Consultants:   PCCM, Oncology    Objective:    Vitals:   07/20/21 1922 07/21/21 0038 07/21/21 0434 07/21/21 0835  BP: (!) 148/70 (!) 142/64 (!) 146/78 (!) 154/68  Pulse: 87 82 86 84  Resp: 18 20 20  (!) 24  Temp: 97.9 F (36.6 C) 98.2 F (36.8 C) 97.6 F (36.4 C) 98  F (36.7 C)  TempSrc: Oral     SpO2: 96% 95% 90% 95%  Weight:      Height:        Intake/Output Summary (Last 24 hours) at 07/21/2021 1050 Last data filed at 07/21/2021 0759 Gross per 24 hour  Intake 391.6 ml  Output --  Net 391.6 ml   Filed Weights   07/20/2021 1706  Weight: 45 kg       Physical Exam:    General exam: Appears calm and comfortable, currently on 3 L oxygen Respiratory system: Rhonchorous breath sounds primarily in the upper lobes with mild expiratory wheezing in the lower lobes Cardiovascular system: Irregular rhythm with regular rate Gastrointestinal system: Abdomen is nondistended, soft and nontender. No organomegaly or masses felt. Normal bowel sounds heard. Central nervous system: Alert and oriented, place and month and president. No focal neurological deficits. Extremities: Symmetric 5 x 5 power. Skin: No rashes, lesions or ulcers Psychiatry: Judgement and insight appear normal.  Somewhat of a labile mood    Data Reviewed:    I have personally reviewed following labs and imaging studies  CBC: Recent Labs  Lab 07/15/21 0446 07/15/21 1727 07/16/21 0521 07/20/21 0748  WBC 1.7* 5.6 4.7 8.0  NEUTROABS 1.0*  --  3.6 5.5  HGB 11.2* 12.5 11.2* 12.6  HCT 32.4* 36.1 33.2* 38.8  MCV 89.8 90.7 91.5 96.3  PLT 186 203 180 676    Basic Metabolic Panel: Recent Labs  Lab 07/15/21 1727 07/16/21 0521 07/17/21 0652 07/18/21 0559 07/20/21 0748  NA 135 134* 136 138 141  K 3.6 3.8 4.0 4.0 3.8  CL 99 101 100 101 102  CO2 28 28 28 31 28   GLUCOSE 137* 100* 138* 134* 104*  BUN 43* 40* 42* 41* 30*  CREATININE 1.23* 1.19* 1.24* 1.22* 0.96  CALCIUM 9.5 8.9 8.8* 8.8* 9.1    GFR: Estimated Creatinine Clearance: 28.2 mL/min (by C-G formula based on SCr of 0.96 mg/dL).  Liver Function Tests: Recent Labs  Lab 07/15/21 0446 07/16/21 0521 07/17/21 0652 07/18/21 0559  AST 26 21 19 18   ALT 14 14 17 17   ALKPHOS 46 44 48 47  BILITOT 0.6 0.6 0.6 0.7   PROT 5.6* 4.7* 5.1* 5.2*  ALBUMIN 3.3* 3.0* 3.1* 3.1*    CBG: No results for input(s): GLUCAP in the last 168 hours.   Recent Results (from the past 240 hour(s))  Resp Panel by RT-PCR (Flu A&B, Covid) Nasopharyngeal Swab     Status: Abnormal   Collection Time: 08/07/2021  5:23 PM   Specimen: Nasopharyngeal Swab; Nasopharyngeal(NP) swabs in vial transport medium  Result Value Ref Range Status   SARS Coronavirus 2 by RT PCR POSITIVE (A) NEGATIVE Final    Comment: (NOTE) SARS-CoV-2 target nucleic acids are DETECTED.  The SARS-CoV-2 RNA is generally detectable in upper respiratory specimens during the acute phase of infection. Positive results are indicative of the presence of the identified  virus, but do not rule out bacterial infection or co-infection with other pathogens not detected by the test. Clinical correlation with patient history and other diagnostic information is necessary to determine patient infection status. The expected result is Negative.  Fact Sheet for Patients: EntrepreneurPulse.com.au  Fact Sheet for Healthcare Providers: IncredibleEmployment.be  This test is not yet approved or cleared by the [place] FDA and  has been authorized for detection and/or diagnosis of SARS-CoV-2 by FDA under an Emergency Use Authorization (EUA).  This EUA will remain in effect (meaning this test can be used) for the duration of  the COVID-19 declaration under Section 564(b)(1) of the A ct, 21 U.S.C. section 360bbb-3(b)(1), unless the authorization is terminated or revoked sooner.     Influenza A by PCR NEGATIVE NEGATIVE Final   Influenza B by PCR NEGATIVE NEGATIVE Final    Comment: (NOTE) The Xpert Xpress SARS-CoV-2/FLU/RSV plus assay is intended as an aid in the diagnosis of influenza from Nasopharyngeal swab specimens and should not be used as a sole basis for treatment. Nasal washings and aspirates are unacceptable for Xpert Xpress  SARS-CoV-2/FLU/RSV testing.  Fact Sheet for Patients: EntrepreneurPulse.com.au  Fact Sheet for Healthcare Providers: IncredibleEmployment.be  This test is not yet approved or cleared by the [place] FDA and has been authorized for detection and/or diagnosis of SARS-CoV-2 by FDA under an Emergency Use Authorization (EUA). This EUA will remain in effect (meaning this test can be used) for the duration of the COVID-19 declaration under Section 564(b)(1) of the Act, 21 U.S.C. section 360bbb-3(b)(1), unless the authorization is terminated or revoked.  Performed at [facility], [address], [place], [place] [address]          Radiology Studies:    [facility] Chest [place] 1 View  Result Date: 07/20/2021 CLINICAL DATA:  Shortness of breath. EXAM: PORTABLE CHEST 1 VIEW COMPARISON:  Chest x-ray and CT chest dated July 15, 2021. FINDINGS: The patient is rotated to the left. Stable cardiomediastinal silhouette with normal heart size. The lungs remain hyperinflated. Unchanged peripheral left upper lobe mass. Unchanged mild left basilar atelectasis. The right lung is clear. No pneumothorax or large pleural effusion. Unchanged elevation of the left hemidiaphragm. No acute osseous abnormality. IMPRESSION: 1. Unchanged peripheral left upper lobe mass. Electronically Signed   By: Titus Dubin M.D.   On: 07/20/2021 11:06        Medications:    Scheduled Meds:  vitamin C  500 mg Oral Daily   calcium-vitamin D  2 tablet Oral Daily   cholecalciferol  1,000 Units Oral Daily   diltiazem  30 mg Oral Q6H   enoxaparin (LOVENOX) injection  30 mg Subcutaneous Q24H   famotidine  20 mg Oral Daily   feeding supplement  1 Container Oral TID BM   guaiFENesin  600 mg Oral BID   guaiFENesin-dextromethorphan  10 mL Oral Q6H   Ipratropium-Albuterol  2 puff Inhalation QID   melatonin  2.5 mg Oral QHS   multivitamin with minerals  1 tablet Oral Daily    [START ON 07/22/2021] predniSONE  10 mg Oral Q breakfast   predniSONE  20 mg Oral Q breakfast   zinc sulfate  220 mg Oral Daily   Continuous Infusions:     LOS: 10 days    Time spent: 35 minutes    Leslee Home, MD [facility]   To contact the attending provider between 7A-7P or the covering provider during after hours 7P-7A, please log into the  web site [email or web address] and access using universal [place] password for that web site. If you do not have the password, please call the hospital operator.  07/21/2021, 10:50 AM

## 2021-07-21 NOTE — Progress Notes (Signed)
Physical Therapy Treatment Patient Details Name: [name] [name] MRN: [id number] DOB: 04-20-1930 Today's Date: 07/21/2021   History of Present Illness [name] [name] is an 12yoF PMH: asthma, COPD, HOH, HTN, comes to ED with acute onset of worsening dyspnea over the last 3 days. COVID-19 + on 07/08/21.  Now off COVID precautions    PT Comments    Patient received in bed, she is HOH. Reports right hip pain. RN in room to give meds. Patient required max encouragement to participate in PT this visit. She eventually agrees reluctantly to at least get up to recliner for a little bit. She is mod independent with bed mobility and required min guard for sit to stand and to take a couple of pivoting steps to recliner. Patient will continue to benefit from skilled PT while here to improve strength and functional independence for safe return home with assistance.       Recommendations for follow up therapy are one component of a multi-disciplinary discharge planning process, led by the attending physician.  Recommendations may be updated based on patient status, additional functional criteria and insurance authorization.  Follow Up Recommendations  Home health PT     Assistance Recommended at Discharge Frequent or constant Supervision/Assistance  Patient can return home with the following A little help with walking and/or transfers;A little help with bathing/dressing/bathroom;Assistance with cooking/housework;Help with stairs or ramp for entrance;Direct supervision/assist for medications management   Equipment Recommendations  None recommended by PT    Recommendations for Other Services       Precautions / Restrictions Precautions Precautions: Fall Restrictions Weight Bearing Restrictions: No     Mobility  Bed Mobility Overal bed mobility: Modified Independent Bed Mobility: Supine to Sit     Supine to sit: HOB elevated;Modified independent (Device/Increase time)           Transfers Overall transfer level: Needs assistance Equipment used: 1 person hand held assist Transfers: Sit to/from Stand;Bed to chair/wheelchair/BSC Sit to Stand: Min guard Stand pivot transfers: Min guard         General transfer comment: Patient required much encouragement to get oob and into recliner.    Ambulation/Gait               General Gait Details: patient declines at this time due to hip pain and fatigue with activity   Stairs             Wheelchair Mobility    Modified Rankin (Stroke Patients Only)       Balance Overall balance assessment: Needs assistance Sitting-balance support: Feet supported Sitting balance-Leahy Scale: Good     Standing balance support: Bilateral upper extremity supported;During functional activity;Reliant on assistive device for balance Standing balance-Leahy Scale: Fair Standing balance comment: for pivot to recliner needed min assist and at least single UE support                            Cognition Arousal/Alertness: Awake/alert Behavior During Therapy: WFL for tasks assessed/performed Overall Cognitive Status: History of cognitive impairments - at baseline                                 General Comments: HOH        Exercises      General Comments        Pertinent Vitals/Pain Pain Assessment: Faces Faces Pain Scale: Hurts even more Breathing: normal  Negative Vocalization: none Facial Expression: smiling or inexpressive Body Language: relaxed Consolability: no need to console PAINAD Score: 0 Pain Location: R Hip pain Pain Descriptors / Indicators: Aching;Discomfort;Sore Pain Intervention(s): Monitored during session;Limited activity within patient's tolerance;Repositioned;RN gave pain meds during session    Home Living                          Prior Function            PT Goals (current goals can now be found in the care plan section) Acute Rehab PT  Goals Patient Stated Goal: to return home PT Goal Formulation: With patient Time For Goal Achievement: 07/26/21 Potential to Achieve Goals: Fair Progress towards PT goals: Not progressing toward goals - comment (patient limited by hip pain this session. needed max encouragement to get oob and up to recliner)    Frequency    Min 2X/week      PT Plan Current plan remains appropriate    Co-evaluation              AM-PAC PT "6 Clicks" Mobility   Outcome Measure  Help needed turning from your back to your side while in a flat bed without using bedrails?: A Little Help needed moving from lying on your back to sitting on the side of a flat bed without using bedrails?: A Little Help needed moving to and from a bed to a chair (including a wheelchair)?: A Little Help needed standing up from a chair using your arms (e.g., wheelchair or bedside chair)?: A Little Help needed to walk in hospital room?: A Little Help needed climbing 3-5 steps with a railing? : A Little 6 Click Score: 18    End of Session Equipment Utilized During Treatment: Oxygen Activity Tolerance: Patient limited by fatigue;Patient limited by pain Patient left: in chair;with call bell/phone within reach;with chair alarm set;with nursing/sitter in room Nurse Communication: Mobility status PT Visit Diagnosis: Muscle weakness (generalized) (M62.81);Difficulty in walking, not elsewhere classified (R26.2);Unsteadiness on feet (R26.81);Pain Pain - Right/Left: Right Pain - part of body: Hip     Time: 9476-5465 PT Time Calculation (min) (ACUTE ONLY): 16 min  Charges:  $Therapeutic Activity: 8-22 mins                     [employer], PT, [employer] 07/21/21,11:22 AM

## 2021-07-21 NOTE — Consult Note (Signed)
Consultation Note Date: 07/21/2021   Patient Name: [name] [name]  DOB: May 15, 1930  MRN: [id number]  Age / Sex: 88 y.o., female  PCP: Jerrol Banana., MD Referring Physician: Leslee Home, DO  Reason for Consultation: Establishing goals of care  HPI/Patient Profile: 88 y.o. female  with past medical history of COPD, asthma, HTN, paroxysmal A. fib admitted on 08/08/2021 with worsening dyspnea, poor oral intake, cough and confusion.  Patient found to be COVID-positive.  Family endorses first positive home test occurred 07/12/2021.  1/5, CT of chest revealed a spiculated mass in the left upper lobe.  1/5, ultrasound of lower extremities revealed no evidence of a DVT.  1/6, CT of head revealed no intracranial abnormalities.  Clinical Assessment and Goals of Care: I have reviewed medical records including EPIC notes, labs and imaging, assessed the patient and then met with patient and her son Broadus John at bedside to discuss diagnosis prognosis, [place], EOL wishes, disposition and options.  I introduced Palliative Medicine as specialized medical care for people living with serious illness. It focuses on providing relief from the symptoms and stress of a serious illness. The goal is to improve quality of life for both the patient and the family.  We discussed a brief life review of the patient.  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  She did not work outside of the home.  As far as functional and nutritional status prior to admission patient was completing ADLs independently.  She was living at home alone and her children checked on her constantly.  Patient's son shares that she has never had a large appetite and that her small appetite has been reduced over the last week or so due to her COVID.  He is concerned with her oral intake.  We discussed patient's current illness and what it means in the  larger context of patient's on-going co-morbidities. I attempted to elicit values and goals of care important to the patient.  Patient shared she is tired and just wants to go home. She also shared that she doesn't like to talk to anyone since it makes her cough.  Patient shares that she has lived a full 82 years of life.    Patient shares that she does not want to eat a lot because everything she swallows feels as if it get stuck in the bottom of her throat.  Throughout our discussion patient seemed aggravated and agitated.  She repeated that she is tired and appeared to be dismissive of any discussions of disposition or plans moving forward.  Attending Dr. Rowe Pavy made aware of my recommendation for a SLP evaluation and psych evaluation.   Discussed with patient/family the importance of continued conversation with family and the medical providers regarding overall plan of care and treatment options, ensuring decisions are within the context of the patients values and GOCs.    Questions and concerns were addressed. The family was encouraged to call with questions or concerns.   Primary Decision Maker PATIENT  Code Status/Advance  Care Planning: Limited code  Prognosis:   Unable to determine  Discharge Planning: To Be Determined  Primary Diagnoses: Present on Admission:  Acute hypoxemic respiratory failure due to COVID-19 [facility])   Physical Exam Vitals and nursing note reviewed.  Constitutional:      General: She is not in acute distress.    Appearance: She is not ill-appearing.  HENT:     Head: Normocephalic and atraumatic.     Mouth/Throat:     Mouth: Mucous membranes are moist.  Cardiovascular:     Rate and Rhythm: Normal rate.  Pulmonary:     Effort: Pulmonary effort is normal.     Breath sounds: Examination of the right-lower field reveals decreased breath sounds. Examination of the left-lower field reveals decreased breath sounds. Decreased breath sounds present.  Chest:      Chest wall: No mass.  Abdominal:     Palpations: Abdomen is soft.  Musculoskeletal:     Comments: Generalized weakness  Skin:    General: Skin is warm and dry.  Neurological:     Mental Status: She is alert and oriented to person, place, and time.  Psychiatric:        Behavior: Behavior is agitated.    Vital Signs: BP (!) 154/68 (BP Location: Right Arm)    Pulse 84    Temp 98 F (36.7 C)    Resp (!) 24    Ht $R'5\' 3"'Dh$  (1.6 m)    Wt 45 kg    SpO2 95%    BMI 17.59 kg/m  Pain Scale: 0-10   Pain Score: 0-No pain SpO2: SpO2: 95 % O2 Device:SpO2: 95 % O2 Flow Rate: .O2 Flow Rate (L/min): 3 L/min  Palliative Assessment/Data: 60%     I discussed this patient's plan of care with Dr. Rowe Pavy, patient, patient's on [place].  Thank you for this consult. Palliative medicine will continue to follow and assist holistically.   Time Total: 70 minutes Greater than 50%  of this time was spent counseling and coordinating care related to the above assessment and plan.  Signed by: Jordan Hawks, DNP, FNP-BC Palliative Medicine    Please contact Palliative Medicine Team phone at [phone] for questions and concerns.  For individual provider: See Shea Evans

## 2021-07-21 NOTE — Progress Notes (Signed)
Occupational Therapy Treatment Patient Details Name: [name] [name] MRN: [id number] DOB: December 29, 1929 Today's Date: 07/21/2021   History of present illness [name] [name] is an 37yoF PMH: asthma, COPD, HOH, HTN, comes to ED with acute onset of worsening dyspnea over the last 3 days. COVID-19 + on 07/08/21.  Now off COVID precautions   OT comments  Pt seen for OT tx this date. New order received after pt initially transferred to progressive care 2/2 A-fib requiring Cardizem drip. Pt received seated EOB, bed alarm going off, and pt reporting she was preparing to use a plastic baggie to urinate in. Pt noted to have removed the purewick. Pt easily redirected and encouraged to use BSC with OT instead. Nurse tech prepared BSC after coming to assist with bed alarm. Pt required set up and SBA for seated pericare after toileting via BSC, CGA for ADL transfers and for ADL mobility (short distances) with RW. PRN VC for pursed lip breathing to support recovery after exertion. SpO2 down to 89% briefly with exertion, improving to >90%. 3L O2 throughout. Pt left in bed, alarm set, with nurse tech in room for additional care. OT goals reviewed, remain appropriate. Pt progressing towards goals, improvement in overall O2 needs as well as functional ADL/mobility participation. Continue to recommend [place] and frequent/constant supervision/assist for safety.     Recommendations for follow up therapy are one component of a multi-disciplinary discharge planning process, led by the attending physician.  Recommendations may be updated based on patient status, additional functional criteria and insurance authorization.    Follow Up Recommendations  Home health OT    Assistance Recommended at Discharge Frequent or constant Supervision/Assistance  Patient can return home with the following  A little help with walking and/or transfers;A little help with bathing/dressing/bathroom;Assistance with  cooking/housework;Direct supervision/assist for medications management;Direct supervision/assist for financial management;Assist for transportation;Help with stairs or ramp for entrance   Equipment Recommendations  Other (comment) (pulse oximeter)    Recommendations for Other Services      Precautions / Restrictions Precautions Precautions: Fall Precaution Comments: monitor O2 Restrictions Weight Bearing Restrictions: No       Mobility Bed Mobility Overal bed mobility: Modified Independent Bed Mobility: Supine to Sit;Sit to Supine     Supine to sit: Modified independent (Device/Increase time);HOB elevated Sit to supine: Modified independent (Device/Increase time)   General bed mobility comments: Pt seated EOB upon OT's arrival, bed alarm going off. Pt requesting to use the bathroom and reported plan to urinate in a plastic baggie because she didn't want to walk to the bathroom.    Transfers Overall transfer level: Needs assistance Equipment used: Rolling walker (2 wheels) Transfers: Sit to/from Stand;Bed to chair/wheelchair/BSC Sit to Stand: Min guard Stand pivot transfers: Min guard Step pivot transfers: Min guard       General transfer comment: Patient required much encouragement to get oob and into recliner.     Balance Overall balance assessment: Needs assistance Sitting-balance support: Feet supported Sitting balance-Leahy Scale: Good     Standing balance support: Bilateral upper extremity supported;During functional activity;Reliant on assistive device for balance Standing balance-Leahy Scale: Fair Standing balance comment: for pivot to recliner needed min assist and at least single UE support                           ADL either performed or assessed with clinical judgement   ADL  General ADL Comments: Pt required set up and SBA for seated pericare after toileting via BSC, CGA for ADL  transfers and for ADL mobility (short distances) with RW. PRN VC for pursed lip breathing to support recovery after exertion. SpO2 down to 89% briefly with exertion, improving to >90%. 3L O2 throughout.    Extremity/Trunk Assessment              Vision       Perception     Praxis      Cognition Arousal/Alertness: Awake/alert Behavior During Therapy: WFL for tasks assessed/performed Overall Cognitive Status: History of cognitive impairments - at baseline                                 General Comments: HOH          Exercises Other Exercises Other Exercises: Pt edu PLB during and after exertional activity to support recovery, educated in falls prevention, use of call bell   Shoulder Instructions       General Comments      Pertinent Vitals/ Pain       Pain Assessment: No/denies pain Faces Pain Scale: Hurts even more Breathing: normal Negative Vocalization: none Facial Expression: smiling or inexpressive Body Language: relaxed Consolability: no need to console PAINAD Score: 0 Pain Location: R Hip pain Pain Descriptors / Indicators: Aching;Discomfort;Sore Pain Intervention(s): Monitored during session;Limited activity within patient's tolerance;Repositioned;RN gave pain meds during session  Home Living                                          Prior Functioning/Environment              Frequency  Min 2X/week        Progress Toward Goals  OT Goals(current goals can now be found in the care plan section)  Progress towards OT goals: Progressing toward goals  Acute Rehab OT Goals Patient Stated Goal: breathe better and go home OT Goal Formulation: With patient/family Time For Goal Achievement: 08/04/21 Potential to Achieve Goals: Good  Plan Discharge plan remains appropriate;Frequency remains appropriate    Co-evaluation                 AM-PAC OT "6 Clicks" Daily Activity     Outcome Measure   Help from  another person eating meals?: None Help from another person taking care of personal grooming?: None Help from another person toileting, which includes using toliet, bedpan, or urinal?: A Little Help from another person bathing (including washing, rinsing, drying)?: A Little Help from another person to put on and taking off regular upper body clothing?: None Help from another person to put on and taking off regular lower body clothing?: A Little 6 Click Score: 21    End of Session Equipment Utilized During Treatment: Oxygen;Rolling walker (2 wheels)  OT Visit Diagnosis: Other abnormalities of gait and mobility (R26.89)   Activity Tolerance Patient tolerated treatment well   Patient Left in bed;with call bell/phone within reach;with bed alarm set;with nursing/sitter in room   Nurse Communication          Time: 1440-1450 OT Time Calculation (min): 10 min  Charges: OT General Charges $OT Visit: 1 Visit OT Treatments $Self Care/Home Management : 8-22 mins  Ardeth Perfect., MPH, MS, OTR/L ascom [phone] 07/21/21, 2:59 PM

## 2021-07-21 NOTE — Evaluation (Addendum)
Clinical/Bedside Swallow Evaluation Patient Details  Name: [name] [name] MRN: [id number] Date of Birth: [birth year]  Today's Date: 07/21/2021 Time: SLP Start Time (ACUTE ONLY): 5329 SLP Stop Time (ACUTE ONLY): 1340 SLP Time Calculation (min) (ACUTE ONLY): 45 min  Past Medical History:  Past Medical History:  Diagnosis Date   Asthma    Cataract    COPD (chronic obstructive pulmonary disease) ([place])    -FeV1 74% DLCO 56%   Emphysema    HTN (hypertension)    Past Surgical History:  Past Surgical History:  Procedure Laterality Date   ankle surgery with implants Left 1994   Bening Tumor Removal from Thyroid   1950's   EYE SURGERY  2014   catarac   HERNIA REPAIR  1960's   Lacrimal gland surgery  2000   lumbar spine fracture and ankle fracture     MASS EXCISION N/A 02/15/2021   Procedure: TRANSANAL MASS EXCISION;  Surgeon: Robert Bellow, MD;  Location: [facility] ORS;  Service: General;  Laterality: N/A;  transanal   Right hip surgery with implants   1996   and LEFT Hip   status post nasal miacalcin     TONSILLECTOMY AND ADENOIDECTOMY  1950   TOTAL HIP ARTHROPLASTY Bilateral    HPI:  Pt is an 88 y.o. female  with past medical history of Multiple medical issues including Barrett's Esophagus w/ dysplasia, Emphysema, COPD, asthma, HTN, paroxysmal A. fib admitted on 07/27/2021 with worsening dyspnea, poor oral intake, cough and confusion.  Unsure of pt's Baseline Cognitive function.  Patient found to be COVID-positive on 07/07/2021 per home test w/ family per Chart Notes -- "Congested, upset stomach , diarrhea, nausea, coughing phlegm" TC to PCP office.       CT of Chest revealed a spiculated mass in the left upper lobe. CXR 07/21/2021: "Unchanged peripheral left upper lobe mass.". Chronic cough at baseline. On O2 support.   Assessment / Plan / Recommendation  Clinical Impression  Pt appears to present w/ grossly adequate oropharyngeal phase swallow function w/ No immediate, overt  oropharyngeal phase dysphagia noted w/ po trials at this evaluation; No neuromuscular deficits noted. Pt consumed po trials w/ No immediate, overt clinical s/s of aspiration during po trials. Pt appears at reduced risk for aspiration from an oropharyngeal phase standpoint when following general aspiration precautions w/ softened, moistened foods, REFLUX precautions, and NOT using straws.   OF NOTE: Pt has a Baseline Dx of Barrett's Esophagus w/ Dysplasia. ANY Dysmotility or Regurgitation of Reflux material can increase risk for aspiration of the Reflux material during Retrograde backflow thus impact Pulmonary status as well as reduce desire for oral intake. Pt described ongoing issues w/ "it's stuck in my chest" pointing to mid sternum area then described the discomfort moves superiorly pointing to her below her Sternal Notch area. She feels this impacts her ability/desire to take full meals(many solid foods). However, her preference to eat SWEETS and Tea(thin liquid). Any Cognitive decline can impact desire for oral intake; preferences for foods and a gravitation towards sweets for her diet. Son reported she is no longer eating some of her "favorite foods" such as spaghetti but eating more cookies, ice cream, and poundcake at home.   Pt sat EOB leaning on pillow this evaluation w/ a light cough intermittently PRIOR to po's(noted CXR results of lung mass); often fidgity w/ the [place] O2. She was given setup then fed self few trials of thin liquids Via Cup, then tsp of puree and chocolate chip cookie  bites w/ No immediate, overt clinical s/s of aspiration noted; clear vocal quality b/t trials, no decline in pulmonary status, no multiple swallows noted post initial pharyngeal swallow. Rest Breaks encouraged to allow for clearing. The light cough as noted PRIOR to po's did not increase in intensity or frequency w/ po intake/trials. Oral phase appeared [facility] for bolus management/control, mastication of cookie, and timely  A-P transfer/clearing of material. Oral clearing achieved w/ all trial consistencies. OM exam was [facility] for oral clearing; lingual/labial movements. No unilateral weakness. Speech clear.  Of Note, pt demonstrated fairly quick s/s of satiety after eating the SWEET foods and refused further po intake including attempts at trials by her Son/SLP of her "favorite food", spaghetti which had been cut/choppled well by Clinician. Pt immediately leaned away stating dramatically "it's going to bother me" pointing to her sternum area and that she "just don't want it". She also became agitated towards Son and Clinician when questioned, and encouraged, further about the need of taking po's of other foods besides sweets. She stated "Webb Silversmith does that too", referring to discussion about eating better foods to get better/stronger.   Recommend a Mech soft consistency diet w/ well-Cut meats, moistened foods; foods of preference as much as possible. Thin liquids VIA CUP - less air swallowed to impact Esophageal phase dysmotility. Recommend general aspiration precautions, REFLUX precautions d/t the Barrett's Esophagus. Pills WHOLE vs Crushed in Puree for safer, easier swallowing and clearing of the Esophagus. Setup and support at meals as needed. Rest Breaks for pulmonary support and clearing of the Esophagus. Education given on Pills in Puree; food consistencies and easy to eat options; general aspiration/Reflux precautions and strategies as described above.  Recommend f/u w/ Palliative Care/MD/GI to discuss impact of Esophageal phase deficits including Barrett's Esophagus as well as any potential Cognitive decline(aging) on oral intake in general; [place] discussion w/ Family/pt. NSG to reconsult if any new needs arise. NSG agreed. Handouts for Family/pt. SLP Visit Diagnosis: Dysphagia, pharyngoesophageal phase (R13.14) (Barrett's Esophagus baseline)    Aspiration Risk  Mild aspiration risk;Risk for inadequate nutrition/hydration (from  an Esophageal phase standpoint as well -- regurgitation)    Diet Recommendation   Mech soft consistency diet w/ well-Cut meats, moistened foods; foods of preference as much as possible. Thin liquids VIA CUP - less air swallowed to impact Esophageal phase dysmotility. Recommend general aspiration precautions, REFLUX precautions d/t the Barrett's Esophagus. Setup and support at meals as needed. Rest Breaks for pulmonary support and clearing of the Esophagus.   Medication Administration: Whole meds with puree (vs Crushed in puree)    Other  Recommendations Recommended Consults: Consider GI evaluation;Consider esophageal assessment (education for family/pt) Oral Care Recommendations: Oral care BID;Oral care before and after PO;Staff/trained caregiver to provide oral care (support pt) Other Recommendations:  (n/a)    Recommendations for follow up therapy are one component of a multi-disciplinary discharge planning process, led by the attending physician.  Recommendations may be updated based on patient status, additional functional criteria and insurance authorization.  Follow up Recommendations No SLP follow up      Assistance Recommended at Discharge Intermittent Supervision/Assistance  Functional Status Assessment Patient has had a recent decline in their functional status and/or demonstrates limited ability to make significant improvements in function in a reasonable and predictable amount of time  Frequency and Duration  (n/a)   (n/a)       Prognosis Prognosis for Safe Diet Advancement: Fair Barriers to Reach Goals: Cognitive deficits;Time post onset;Severity of deficits;Behavior (Barrett's  Esophagus)      Swallow Study   General Date of Onset: 07/29/2021 HPI: Pt is an 88 y.o. female  with past medical history of Multiple medical issues including Barrett's Esophagus w/ dysplasia, Emphysema, COPD, asthma, HTN, paroxysmal A. fib admitted on 08/07/2021 with worsening dyspnea, poor oral intake,  cough and confusion.  Patient found to be COVID-positive on 07/07/2021 per home test w/ family per Chart Notes -- "Congested, upset stomach , diarrhea, nausea, coughing phlegm" TC to PCP office.      CT of Chest revealed a spiculated mass in the left upper lobe. CXR 07/21/2021: "Unchanged peripheral left upper lobe mass.". Type of Study: Bedside Swallow Evaluation Previous Swallow Assessment: none Diet Prior to this Study: Regular;Thin liquids Temperature Spikes Noted: No (wbc 8.0) Respiratory Status: Nasal cannula (3L) History of Recent Intubation: No Behavior/Cognition: Alert;Cooperative;Pleasant mood;Confused;Distractible;Requires cueing (unsure of pt's Baseline Cognitive status??) Oral Cavity Assessment: Within Functional Limits Oral Care Completed by SLP: Recent completion by staff Vision: Functional for self-feeding Self-Feeding Abilities: Able to feed self;Needs set up Patient Positioning: Postural control adequate for testing (sat EOB leaning on pillow support) Baseline Vocal Quality: Normal Volitional Cough: Strong;Congested Volitional Swallow: Able to elicit    Oral/Motor/Sensory Function Overall Oral Motor/Sensory Function: Within functional limits   Ice Chips Ice chips: Not tested   Thin Liquid Thin Liquid: Within functional limits Presentation: Cup;Self Fed (~8 trials)    Nectar Thick Nectar Thick Liquid: Not tested   Honey Thick Honey Thick Liquid: Not tested   Puree Puree: Within functional limits Presentation: Self Fed;Spoon (magic cup, applesauce -- 10 trials)   Solid     Solid: Within functional limits (grossly) Presentation: Self Fed (3 bites of cookie(choc. chip)) Other Comments: had been eating poundcake in room brought in by Son        Orinda Kenner, [place], [place] 07/21/2021,7:32 PM

## 2021-07-22 ENCOUNTER — Encounter: Payer: Self-pay | Admitting: Family Medicine

## 2021-07-22 DIAGNOSIS — Z515 Encounter for palliative care: Secondary | ICD-10-CM | POA: Diagnosis not present

## 2021-07-22 DIAGNOSIS — E43 Unspecified severe protein-calorie malnutrition: Secondary | ICD-10-CM | POA: Diagnosis not present

## 2021-07-22 DIAGNOSIS — C801 Malignant (primary) neoplasm, unspecified: Secondary | ICD-10-CM

## 2021-07-22 DIAGNOSIS — J9601 Acute respiratory failure with hypoxia: Secondary | ICD-10-CM | POA: Diagnosis not present

## 2021-07-22 DIAGNOSIS — J189 Pneumonia, unspecified organism: Secondary | ICD-10-CM | POA: Diagnosis not present

## 2021-07-22 DIAGNOSIS — F321 Major depressive disorder, single episode, moderate: Secondary | ICD-10-CM

## 2021-07-22 DIAGNOSIS — I480 Paroxysmal atrial fibrillation: Secondary | ICD-10-CM

## 2021-07-22 DIAGNOSIS — R0989 Other specified symptoms and signs involving the circulatory and respiratory systems: Secondary | ICD-10-CM

## 2021-07-22 DIAGNOSIS — U071 COVID-19: Secondary | ICD-10-CM | POA: Diagnosis not present

## 2021-07-22 LAB — MAGNESIUM: Magnesium: 1.4 mg/dL — ABNORMAL LOW (ref 1.7–2.4)

## 2021-07-22 LAB — COMPREHENSIVE METABOLIC PANEL
ALT: 17 U/L (ref 0–44)
AST: 18 U/L (ref 15–41)
Albumin: 3.4 g/dL — ABNORMAL LOW (ref 3.5–5.0)
Alkaline Phosphatase: 48 U/L (ref 38–126)
Anion gap: 6 (ref 5–15)
BUN: 38 mg/dL — ABNORMAL HIGH (ref 8–23)
CO2: 31 mmol/L (ref 22–32)
Calcium: 8.7 mg/dL — ABNORMAL LOW (ref 8.9–10.3)
Chloride: 105 mmol/L (ref 98–111)
Creatinine, Ser: 1.24 mg/dL — ABNORMAL HIGH (ref 0.44–1.00)
GFR, Estimated: 42 mL/min — ABNORMAL LOW (ref >60)
Glucose, Bld: 103 mg/dL — ABNORMAL HIGH (ref 70–99)
Potassium: 3.6 mmol/L (ref 3.5–5.1)
Sodium: 142 mmol/L (ref 135–145)
Total Bilirubin: 0.6 mg/dL (ref 0.3–1.2)
Total Protein: 5.5 g/dL — ABNORMAL LOW (ref 6.5–8.1)

## 2021-07-22 LAB — TSH: TSH: 1.127 u[IU]/mL (ref 0.350–4.500)

## 2021-07-22 MED ORDER — SODIUM CHLORIDE 0.9 % IV SOLN
INTRAVENOUS | Status: DC
Start: 2021-07-22 — End: 2021-07-23

## 2021-07-22 MED ORDER — DILTIAZEM HCL 30 MG PO TABS
60.0000 mg | ORAL_TABLET | Freq: Four times a day (QID) | ORAL | Status: DC
  Administered 2021-07-22 – 2021-07-29 (×19): 60 mg via ORAL
  Filled 2021-07-22 (×21): qty 2

## 2021-07-22 MED ORDER — IPRATROPIUM BROMIDE 0.02 % IN SOLN
0.5000 mg | Freq: Four times a day (QID) | RESPIRATORY_TRACT | Status: DC
  Administered 2021-07-22 – 2021-07-24 (×9): 0.5 mg via RESPIRATORY_TRACT
  Filled 2021-07-22 (×9): qty 2.5

## 2021-07-22 MED ORDER — MAGNESIUM SULFATE 2 GM/50ML IV SOLN
2.0000 g | Freq: Once | INTRAVENOUS | Status: AC
  Administered 2021-07-22: 2 g via INTRAVENOUS
  Filled 2021-07-22: qty 50

## 2021-07-22 MED ORDER — SODIUM CHLORIDE 3 % IN NEBU
4.0000 mL | INHALATION_SOLUTION | Freq: Every day | RESPIRATORY_TRACT | Status: AC
  Administered 2021-07-22 – 2021-07-23 (×2): 4 mL via RESPIRATORY_TRACT
  Administered 2021-07-24: 15 mL via RESPIRATORY_TRACT
  Filled 2021-07-22 (×3): qty 4

## 2021-07-22 MED ORDER — CITALOPRAM HYDROBROMIDE 20 MG PO TABS
10.0000 mg | ORAL_TABLET | Freq: Every day | ORAL | Status: DC
  Administered 2021-07-22 – 2021-07-29 (×6): 10 mg via ORAL
  Filled 2021-07-22 (×7): qty 1

## 2021-07-22 MED ORDER — DILTIAZEM HCL 30 MG PO TABS: 60.0000 mg | ORAL_TABLET | Freq: Four times a day (QID) | ORAL | Status: DC

## 2021-07-22 MED ORDER — LEVALBUTEROL HCL 0.63 MG/3ML IN NEBU
0.6300 mg | INHALATION_SOLUTION | Freq: Four times a day (QID) | RESPIRATORY_TRACT | Status: DC
  Administered 2021-07-22 – 2021-07-24 (×9): 0.63 mg via RESPIRATORY_TRACT
  Filled 2021-07-22 (×9): qty 3

## 2021-07-22 NOTE — Progress Notes (Addendum)
Progress Note:    [name] [name]    MBW:[id number] DOB: 01-10-1930 DOA: 07/31/2021  PCP: Jerrol Banana., MD    Brief Narrative:   This patient is a 88 year old female with history of asthma/COPD, hypertension, paroxysmal A. fib who presented here from home with complaints of acute onset of worsening dyspnea, poor oral intake, cough, confusion.  On presentation, she was hypoxic on room air and had to be placed on 10 L of oxygen.  Chest x-ray showed opacity in the left midlung concerning for pneumonia, emphysema.  COVID screen test came out  to be positive.  Hospital course remarkable for worsening confusion, hypoxia.  Overall status was improving, nearing to discharge.  But in the morning of 07/19/2021, she went into A. fib with RVR requiring Cardizem drip.      Subjective:   Currently on 3L. More interactive with me today than yesterday. Remains on Cardizem drip. Denies any SI. Still having cough.  Assessment and Plan:   Acute hypoxic respiratory failure secondary to COVID-19 infection/COPD exacerbation: Currently on 3 liters oxygen and saturating well.  COVID-19 test was positive on 07/30/2021.  D-dimer was elevated and has trended down but I have low suspicion for PE.  Completed baricitinib on 1/5.  Completed course of remdesivir.  Continue oral steroids.  PCCM consulted and following loosely.  Discontinue isolation precautions. Has been started on Xoponex and Atrovent nebs as well as FV, ICS. I have added hypertonic nebs as well.  Left upper lobe lung mass: CT chest done on 07/15/2021 showed 3.4 x 2.9 cm spiculated mass consistent with malignancy.  Medical oncology and PCCM were consulted.  PCCM planning for bronchoscopy/PET scan as an outpatient.   Paroxysmal A. fib: Has history of A. fib, not on anticoagulation due to advanced age, history of rectal bleeding,risk of falls  TSH WNL. Cardiology consulted assist with help with attempting to come off Cardizem drip.  History of  COPD: Currently on steroids.    Acute metabolic encephalopathy/delirium: Likely exacerbated from [place].CT head did not show any acute intracranial findings.  Monitor mental status.  Delirium precautions.  Currently alert and oriented x3.   CKD stage II: Currently kidney function at baseline.   Hypertension: Currently BP stable.  Holding home Hyzaar.   Severe protein calorie malnutrition: Dietitian following   Debility/deconditioning: She lives alone.  Her children live nearby.  PT/OT recommend HHPT/OT upon discharge.   Goals of care: Very elderly patient with multiple comorbidities now with left upper lobe lung mass with possible malignancy.  Palliative care consulted for goals of care. Partial code.    Other information:    DVT prophylaxis: Lovenox subcu Code Status: Partial code Family Communication: Son was at side upon my evaluation Disposition:   Status is: Inpatient  Remains inpatient appropriate because: Hypoxic and on diltiazem drip     Consultants:   PCCM, Oncology, cardiology    Objective:    Vitals:   07/22/21 0559 07/22/21 0801 07/22/21 1149 07/22/21 1601  BP: 134/62 130/75 130/64 (!) 149/67  Pulse: 73 72 76 81  Resp: 18 19 18 18   Temp: 98 F (36.7 C) 97.8 F (36.6 C) 97.8 F (36.6 C) 97.6 F (36.4 C)  TempSrc: Oral     SpO2: 99% 99% 99% 95%  Weight:      Height:        Intake/Output Summary (Last 24 hours) at 07/22/2021 1747 Last data filed at 07/22/2021 1500 Gross per 24 hour  Intake  196.3 ml  Output --  Net 196.3 ml    Filed Weights   07/31/2021 1706  Weight: 45 kg       Physical Exam:    General exam: Appears calm and comfortable, currently on 3 L oxygen Respiratory system: Rhonchorous breath sounds primarily in the upper lobes with mild expiratory wheezing in the lower lobes Cardiovascular system: Regular rhythm with regular rate Gastrointestinal system: Abdomen is nondistended, soft and nontender. No organomegaly or masses  felt. Normal bowel sounds heard. Central nervous system: Alert and oriented, place and month and president. No focal neurological deficits. Extremities: Symmetric 5 x 5 power. Skin: No rashes, lesions or ulcers Psychiatry: Judgement and insight appear normal.  Somewhat of a labile mood    Data Reviewed:    I have personally reviewed following labs and imaging studies  CBC: Recent Labs  Lab 07/16/21 0521 07/20/21 0748  WBC 4.7 8.0  NEUTROABS 3.6 5.5  HGB 11.2* 12.6  HCT 33.2* 38.8  MCV 91.5 96.3  PLT 180 357     Basic Metabolic Panel: Recent Labs  Lab 07/16/21 0521 07/17/21 0652 07/18/21 0559 07/20/21 0748 07/22/21 0739  NA 134* 136 138 141 142  K 3.8 4.0 4.0 3.8 3.6  CL 101 100 101 102 105  CO2 28 28 31 28 31   GLUCOSE 100* 138* 134* 104* 103*  BUN 40* 42* 41* 30* 38*  CREATININE 1.19* 1.24* 1.22* 0.96 1.24*  CALCIUM 8.9 8.8* 8.8* 9.1 8.7*  MG  --   --   --   --  1.4*     GFR: Estimated Creatinine Clearance: 21.8 mL/min (A) (by C-G formula based on SCr of 1.24 mg/dL (H)).  Liver Function Tests: Recent Labs  Lab 07/16/21 0521 07/17/21 0652 07/18/21 0559 07/22/21 0739  AST 21 19 18 18   ALT 14 17 17 17   ALKPHOS 44 48 47 48  BILITOT 0.6 0.6 0.7 0.6  PROT 4.7* 5.1* 5.2* 5.5*  ALBUMIN 3.0* 3.1* 3.1* 3.4*     CBG: No results for input(s): GLUCAP in the last 168 hours.   No results found for this or any previous visit (from the past 240 hour(s)).        Radiology Studies:    No results found.      Medications:    Scheduled Meds:  vitamin C  500 mg Oral Daily   calcium-vitamin D  2 tablet Oral Daily   cholecalciferol  1,000 Units Oral Daily   citalopram  10 mg Oral Daily   diltiazem  60 mg Oral Q6H   enoxaparin (LOVENOX) injection  30 mg Subcutaneous Q24H   famotidine  20 mg Oral Daily   feeding supplement  1 Container Oral TID BM   guaiFENesin-dextromethorphan  10 mL Oral Q6H   ipratropium  0.5 mg Nebulization Q6H    levalbuterol  0.63 mg Nebulization Q6H   melatonin  2.5 mg Oral QHS   multivitamin with minerals  1 tablet Oral Daily   predniSONE  10 mg Oral Q breakfast   sodium chloride HYPERTONIC  4 mL Nebulization Daily   zinc sulfate  220 mg Oral Daily   Continuous Infusions:  sodium chloride Stopped (07/22/21 1435)       LOS: 11 days    Time spent: 35 minutes    Leslee Home, MD [facility]   To contact the attending provider between 7A-7P or the covering provider during after hours 7P-7A, please log into the web site [email or web address] and access  using universal [place] password for that web site. If you do not have the password, please call the hospital operator.  07/22/2021, 5:47 PM

## 2021-07-22 NOTE — Consult Note (Signed)
[facility] Face-to-Face Psychiatry Consult   Reason for Consult:  depression  Referring Physician:  Dr Rowe Pavy Patient Identification: [name] [name] MRN:  [id number] Principal Diagnosis: Acute hypoxemic respiratory failure due to COVID-19 [facility]) Diagnosis:  Principal Problem:   Acute hypoxemic respiratory failure due to COVID-19 [facility]) Active Problems:   Major depressive disorder, single episode, moderate ([place])   Protein-calorie malnutrition, severe   Total Time spent with patient: 1 hour  Subjective:   [name] [name] is a 88 y.o. female patient admitted with COVID and respiratory issues.  "They called you in under false pretense. It's not as rough as I thought it was.  I just made it up,I'm tired.  I'm 47 years old and all my friends are dead but two and I don't  have anything to do.  Then, I got this COVID."  HPI:  89 yo female admitted with COVID and sequela.  She minimizes her depression and states she is sad at times as she is 72 and most of her friends have moved on.  She wonders if there is something better.  "I get depressed a bit, nothing bad."  Denies suicidal/homicidal ideations, hallucinations, or substance use.  She is jovial during the assessment and interacting well with this provider and  nurses who entered the room.  Currently working with her incentive spirometer.  Reports her sleep is "good when I get a chance.  They are in and out of my room all the time."  Appetite is low, reports she had surgery a couple of years ago and the ET tube they used damaged her throat and she has had issues since then.  She is also congested and not feeling physically well.  Smiles appropriately at times.  She does state, "I do worry a lot." Agreeable with the Celexa 10 mg daily they started and explained it would assist with depression and anxiety.  Her family was at her bedside earlier and one member stepped outside to let this provider know he felt she just needed something "to the edge off."     Past Psychiatric History: none  Risk to Self:  none Risk to Others:  none Prior Inpatient Therapy:  none Prior Outpatient Therapy:   none Past Medical History:  Past Medical History:  Diagnosis Date   Asthma    Cataract    COPD (chronic obstructive pulmonary disease) ([place])    -FeV1 74% DLCO 56%   Emphysema    HTN (hypertension)     Past Surgical History:  Procedure Laterality Date   ankle surgery with implants Left 1994   Bening Tumor Removal from Thyroid   1950's   EYE SURGERY  2014   catarac   HERNIA REPAIR  1960's   Lacrimal gland surgery  2000   lumbar spine fracture and ankle fracture     MASS EXCISION N/A 02/15/2021   Procedure: TRANSANAL MASS EXCISION;  Surgeon: Robert Bellow, MD;  Location: [facility] ORS;  Service: General;  Laterality: N/A;  transanal   Right hip surgery with implants   1996   and LEFT Hip   status post nasal miacalcin     TONSILLECTOMY AND ADENOIDECTOMY  1950   TOTAL HIP ARTHROPLASTY Bilateral    Family History:  Family History  Problem Relation Age of Onset   Ovarian cancer Mother    Heart attack Father    Heart disease Brother    Asthma Other    Family Psychiatric  History: none Social History:  Social History  Substance and Sexual Activity  Alcohol Use Yes   Alcohol/week: 1.0 standard drink   Types: 1 Glasses of wine per week   Comment: occasionally     Social History   Substance and Sexual Activity  Drug Use No    Social History   Socioeconomic History   Marital status: Widowed    Spouse name: Not on file   Number of children: Not on file   Years of education: Not on file   Highest education level: Not on file  Occupational History   Not on file  Tobacco Use   Smoking status: Former    Types: Cigarettes    Quit date: 07/11/2002    Years since quitting: 19.0   Smokeless tobacco: Never  Vaping Use   Vaping Use: Never used  Substance and Sexual Activity   Alcohol use: Yes    Alcohol/week: 1.0 standard drink     Types: 1 Glasses of wine per week    Comment: occasionally   Drug use: No   Sexual activity: Never  Other Topics Concern   Not on file  Social History Narrative   Not on file   Social Determinants of Health   Financial Resource Strain: Not on file  Food Insecurity: Not on file  Transportation Needs: Not on file  Physical Activity: Not on file  Stress: Not on file  Social Connections: Not on file   Additional Social History:    Allergies:   Allergies  Allergen Reactions   Ciprofloxacin     Unknown   Macrobid  [Nitrofurantoin]     Unknown reaction   Metoprolol Swelling    feet swelling/pain   Sulfa Antibiotics     Upset stomach    Sulfonamide Derivatives     Upset stomach    Labs:  Results for orders placed or performed during the hospital encounter of 07/22/2021 (from the past 48 hour(s))  TSH     Status: None   Collection Time: 07/22/21  7:39 AM  Result Value Ref Range   TSH 1.127 0.350 - 4.500 uIU/mL    Comment: Performed by a 3rd Generation assay with a functional sensitivity of <=0.01 uIU/mL. Performed at [facility], [address] [place], [place] [address]   Comprehensive metabolic panel     Status: Abnormal   Collection Time: 07/22/21  7:39 AM  Result Value Ref Range   Sodium 142 135 - 145 mmol/L   Potassium 3.6 3.5 - 5.1 mmol/L   Chloride 105 98 - 111 mmol/L   CO2 31 22 - 32 mmol/L   Glucose, Bld 103 (H) 70 - 99 mg/dL    Comment: Glucose reference range applies only to samples taken after fasting for at least 8 hours.   BUN 38 (H) 8 - 23 mg/dL   Creatinine, Ser 1.24 (H) 0.44 - 1.00 mg/dL   Calcium 8.7 (L) 8.9 - 10.3 mg/dL   Total Protein 5.5 (L) 6.5 - 8.1 g/dL   Albumin 3.4 (L) 3.5 - 5.0 g/dL   AST 18 15 - 41 U/L   ALT 17 0 - 44 U/L   Alkaline Phosphatase 48 38 - 126 U/L   Total Bilirubin 0.6 0.3 - 1.2 mg/dL   GFR, Estimated 42 (L) >60 mL/min    Comment: (NOTE) Calculated using the CKD-EPI Creatinine Equation (2021)    Anion gap 6 5  - 15    Comment: Performed at [facility], [address], [place], [place] [address]  Magnesium  Status: Abnormal   Collection Time: 07/22/21  7:39 AM  Result Value Ref Range   Magnesium 1.4 (L) 1.7 - 2.4 mg/dL    Comment: Performed at [facility], [address], [place], [place] [address]    Current Facility-Administered Medications  Medication Dose Route Frequency Provider Last Rate Last Admin   0.9 %  sodium chloride infusion   Intravenous Continuous Theora Gianotti, NP 75 mL/hr at 07/22/21 1432 New Bag at 07/22/21 1432   acetaminophen (TYLENOL) tablet 650 mg  650 mg Oral Q6H PRN Mansy, Jan A, MD   650 mg at 07/21/21 2105   ascorbic acid (VITAMIN C) tablet 500 mg  500 mg Oral Daily Mansy, Jan A, MD   500 mg at 07/22/21 1043   benzonatate (TESSALON) capsule 100 mg  100 mg Oral TID PRN Mansy, Arvella Merles, MD       calcium-vitamin D (OSCAL WITH D) 500-5 MG-MCG per tablet 2 tablet  2 tablet Oral Daily Mansy, Jan A, MD   2 tablet at 07/22/21 1043   chlorpheniramine-HYDROcodone (TUSSIONEX) 10-8 MG/5ML suspension 5 mL  5 mL Oral Q12H PRN Mansy, Jan A, MD   5 mL at 07/15/21 2306   cholecalciferol (VITAMIN D3) tablet 1,000 Units  1,000 Units Oral Daily Mansy, Jan A, MD   1,000 Units at 07/22/21 1043   citalopram (CELEXA) tablet 10 mg  10 mg Oral Daily Imagene Sheller S, DO   10 mg at 07/22/21 1043   diltiazem (CARDIZEM) tablet 60 mg  60 mg Oral Q6H Theora Gianotti, NP       enoxaparin (LOVENOX) injection 30 mg  30 mg Subcutaneous Q24H Vira Blanco, RPH   30 mg at 07/21/21 2106   famotidine (PEPCID) tablet 20 mg  20 mg Oral Daily Darrick Penna, RPH   20 mg at 07/22/21 1043   feeding supplement (BOOST / RESOURCE BREEZE) liquid 1 Container  1 Container Oral TID BM Mansy, Jan A, MD   1 Container at 07/22/21 1043   guaiFENesin-dextromethorphan (ROBITUSSIN DM) 100-10 MG/5ML syrup 10 mL  10 mL Oral Q6H Adhikari, Amrit, MD   10 mL at 07/22/21 1043    HYDROcodone-acetaminophen (NORCO/VICODIN) 5-325 MG per tablet 1 tablet  1 tablet Oral Q6H PRN Imagene Sheller S, DO   1 tablet at 07/21/21 1109   ipratropium (ATROVENT) nebulizer solution 0.5 mg  0.5 mg Nebulization Q6H Theora Gianotti, NP   0.5 mg at 07/22/21 1349   levalbuterol (XOPENEX) nebulizer solution 0.63 mg  0.63 mg Nebulization Q6H Theora Gianotti, NP   0.63 mg at 07/22/21 1349   magnesium hydroxide (MILK OF MAGNESIA) suspension 30 mL  30 mL Oral Daily PRN Mansy, Jan A, MD       magnesium sulfate IVPB 2 g 50 mL  2 g Intravenous Once Imagene Sheller S, DO       melatonin tablet 2.5 mg  2.5 mg Oral QHS Hall, Carole N, DO   2.5 mg at 07/21/21 2105   multivitamin with minerals tablet 1 tablet  1 tablet Oral Daily Mansy, Jan A, MD   1 tablet at 07/22/21 1043   ondansetron (ZOFRAN) tablet 4 mg  4 mg Oral Q6H PRN Mansy, Jan A, MD   4 mg at 07/16/21 1010   Or   ondansetron (ZOFRAN) injection 4 mg  4 mg Intravenous Q6H PRN Mansy, Jan A, MD       predniSONE (DELTASONE) tablet 10 mg  10 mg Oral  Q breakfast Shelly Coss, MD   10 mg at 07/22/21 1043   zinc sulfate capsule 220 mg  220 mg Oral Daily Mansy, Jan A, MD   220 mg at 07/22/21 1043    Musculoskeletal: Strength & Muscle Tone: decreased Gait & Station:  did not witness Patient leans: Right  Psychiatric Specialty Exam: Physical Exam Vitals and nursing note reviewed.  HENT:     Head: Normocephalic.  Musculoskeletal:     Cervical back: Normal range of motion.  Neurological:     General: No focal deficit present.     Mental Status: She is alert and oriented to person, place, and time.  Psychiatric:        Attention and Perception: Attention and perception normal.        Mood and Affect: Mood is anxious and depressed.        Speech: Speech normal.        Behavior: Behavior normal. Behavior is cooperative.        Thought Content: Thought content normal.        Cognition and Memory: Cognition and memory normal.         Judgment: Judgment normal.    Review of Systems  Constitutional:  Positive for malaise/fatigue.  Respiratory:  Positive for cough.   Psychiatric/Behavioral:  Positive for depression. The patient is nervous/anxious.    Blood pressure 130/64, pulse 76, temperature 97.8 F (36.6 C), resp. rate 18, height 5\' 3"  (1.6 m), weight 45 kg, SpO2 99 %.Body mass index is 17.59 kg/m.  General Appearance: Casual  Eye Contact:  Good  Speech:  Normal Rate  Volume:  Normal  Mood:  Anxious and Depressed  Affect:  Congruent  Thought Process:  Coherent and Descriptions of Associations: Intact  Orientation:  Full (Time, Place, and Person)  Thought Content:  WDL and Logical  Suicidal Thoughts:  No  Homicidal Thoughts:  No  Memory:  Immediate;   Good Recent;   Good Remote;   Good  Judgement:  Good  Insight:  Good  Psychomotor Activity:  Decreased  Concentration:  Concentration: Good and Attention Span: Good  Recall:  Good  Fund of Knowledge:  Good  Language:  Good  Akathisia:  No  Handed:  Right  AIMS (if indicated):     Assets:  Leisure Time Resilience Social Support  ADL's:  Impaired  Cognition:  WNL  Sleep:        Physical Exam: Physical Exam Vitals and nursing note reviewed.  HENT:     Head: Normocephalic.  Musculoskeletal:     Cervical back: Normal range of motion.  Neurological:     General: No focal deficit present.     Mental Status: She is alert and oriented to person, place, and time.  Psychiatric:        Attention and Perception: Attention and perception normal.        Mood and Affect: Mood is anxious and depressed.        Speech: Speech normal.        Behavior: Behavior normal. Behavior is cooperative.        Thought Content: Thought content normal.        Cognition and Memory: Cognition and memory normal.        Judgment: Judgment normal.   Review of Systems  Constitutional:  Positive for malaise/fatigue.  Respiratory:  Positive for cough.    Psychiatric/Behavioral:  Positive for depression. The patient is nervous/anxious.   Blood pressure 130/64, pulse 76,  temperature 97.8 F (36.6 C), resp. rate 18, height 5\' 3"  (1.6 m), weight 45 kg, SpO2 99 %. Body mass index is 17.59 kg/m.  Treatment Plan Summary: Major depressive disorder, recurrent, mild to moderate: -Continue Celexa 10 mg daily  General anxiety disorder: -Continue Celexa 10 mg daily  Disposition: No evidence of imminent risk to self or others at present.   Patient does not meet criteria for psychiatric inpatient admission. Supportive therapy provided about ongoing stressors.  Waylan Boga, NP 07/22/2021 2:35 PM

## 2021-07-22 NOTE — Progress Notes (Addendum)
Palliative Care Progress Note, Assessment & Plan   Patient Name: [name] [name]       Date: 07/22/2021 DOB: [birth year]  Age: 88 y.o. MRN#: [id number] Attending Physician: Leslee Home, DO Primary Care Physician: Jerrol Banana., MD Admit Date: 07/28/2021  Reason for Consultation/Follow-up: Establishing goals of care  Subjective: Patient is sitting up in bed, watching TV, and in no apparent distress.  She acknowledges my presence.  She takes small bites of her breakfast tray during our discussion without complication or cough.  HPI: 88 y.o. female  with past medical history of COPD, asthma, HTN, paroxysmal A. fib admitted on 07/26/2021 with worsening dyspnea, poor oral intake, cough and confusion.  Patient found to be COVID-positive.  Family endorses first positive home test occurred 07/16/2021.  1/5, CT of chest revealed a spiculated mass in the left upper lobe.  1/5, ultrasound of lower extremities revealed no evidence of a DVT.  1/6, CT of head revealed no intracranial abnormalities.  Summary of counseling/coordination of care: After reviewing the patient's chart and assessing the patient, I sat bedside and discussed patient's disposition.  She shares she is tired of living, does not see the point, and thinks that there is nothing fun for her to keep going on with.  She says that if she "could drink something and make it all go away she would be happy".  Patient denies suicidal ideation, thoughts of harming herself, and denies having any plan for ending her life.  Patient also denies depression.  Attending Dr. Rowe Pavy made aware and psych consult recommended.  Psych consult placed and patient to be evaluated.  Therapeutic silence and active listening provided for patient to share her thoughts and  emotions regarding her current health status.  She says that she is almost 89, has seen so many of her family and friends die, and she just does not know why she is not been "taken yet".  Patient also shared she does not want to be a burden on her family.  Patient asked if her lungs were better and if she had cancer.  Reviewed CT scan that revealed spiculated mass in left upper lobe.  Patient was aware of mass.  Educated and reviewed patient's options for participating in PET scan and bronchoscopy versus no treatment.  Human mortality discussed with patient.  Patient shared she knows she cannot live forever and feels as though her son does not recognize these facts.  Patient was appreciative to have someone to talk to about things other than being told she needed to eat or that she needed to get stronger.  Palliative medicine team will continue to follow the patient throughout her hospitalization.  Questions and concerns from patient were addressed.  Code Status: Limited code  Prognosis: Unable to determine  Discharge Planning: To Be Determined  Recommendations/Plan: Psyc consult placed  Care plan was discussed with patient, Dr. Rowe Pavy  Physical Exam Vitals and nursing note reviewed.  Constitutional:      Appearance: She is well-developed.  HENT:     Head: Normocephalic and atraumatic.  Cardiovascular:     Rate and Rhythm: Normal rate.  Pulmonary:     Effort: Pulmonary effort is normal.  Comments: Mod to strong non productive cough - stronger than yesterday and not as frequent Abdominal:     Palpations: Abdomen is soft.  Musculoskeletal:        General: Normal range of motion.     Cervical back: Normal range of motion.     Comments: Generalized weakness  Skin:    General: Skin is warm and dry.  Neurological:     Mental Status: She is alert and oriented to person, place, and time.  Psychiatric:        Mood and Affect: Mood normal. Mood is not anxious.        Behavior:  Behavior normal. Behavior is not agitated.            Palliative Assessment/Data: 60%    Total Time 25 minutes  Greater than 50%  of this time was spent counseling and coordinating care related to the above assessment and plan.  Thank you for allowing the Palliative Medicine Team to assist in the care of this patient.  [place] Ilsa Iha, FNP-BC Palliative Medicine Team Team Phone # [phone]

## 2021-07-22 NOTE — Consult Note (Signed)
Cardiology Consult    Patient ID: [name] [name] MRN: [id number], DOB/AGE: October 27, 1929   Admit date: 08/07/2021 Date of Consult: 07/22/2021  Primary Physician: Jerrol Banana., MD Primary Cardiologist: Ida Rogue, MD Requesting Provider: Chauncey Cruel. Rowe Pavy, DO  Patient Profile    [name] [name] is a 88 y.o. female with a history of COPD, emphysema, hypertension, cataracts, and paroxysmal atrial fibrillation, who is being seen today for the evaluation of paroxysmal atrial fibrillation at the request of Dr. Rowe Pavy.  Past Medical History   Past Medical History:  Diagnosis Date   Asthma    Cataract    Chest pain    a. 11/2006 Cath: Nl cors, nl filling pressures.   COPD (chronic obstructive pulmonary disease) (HCC)    -FeV1 74% DLCO 85%   Diastolic dysfunction    a. 12/2006 Echo: EF 70-75%, Gr1 DD; b. 11/2013 Echo: EF 55-60%, mild MR/TR.   Emphysema    HTN (hypertension)    PAF (paroxysmal atrial fibrillation) (HCC)    a. CHA2DS2VASc = 4.  Not felt to be suitable OAC candidate 2/2 falls/unsteady gait.    Past Surgical History:  Procedure Laterality Date   ankle surgery with implants Left 1994   Bening Tumor Removal from Thyroid   1950's   EYE SURGERY  2014   catarac   HERNIA REPAIR  1960's   Lacrimal gland surgery  2000   lumbar spine fracture and ankle fracture     MASS EXCISION N/A 02/15/2021   Procedure: TRANSANAL MASS EXCISION;  Surgeon: Robert Bellow, MD;  Location: [facility] ORS;  Service: General;  Laterality: N/A;  transanal   Right hip surgery with implants   1996   and LEFT Hip   status post nasal miacalcin     TONSILLECTOMY AND ADENOIDECTOMY  1950   TOTAL HIP ARTHROPLASTY Bilateral      Allergies  Allergies  Allergen Reactions   Ciprofloxacin     Unknown   Macrobid  [Nitrofurantoin]     Unknown reaction   Metoprolol Swelling    feet swelling/pain   Sulfa Antibiotics     Upset stomach    Sulfonamide Derivatives     Upset stomach    History of  Present Illness    88 year old female with a history of COPD, emphysema, hypertension, cataracts, and paroxysmal atrial fibrillation.  In 2008, she was evaluated for chest pain and progressive dyspnea and underwent diagnostic catheterization revealing normal coronary arteries and normal filling pressures.  Other than preoperative clearance in September 2015, she has not required outpatient cardiology care.  Her daughter, who is present in the room today notes that maybe about 10 years ago, patient was admitted with pneumonia and had paroxysmal atrial fibrillation.  Notes indicate that patient has not been anticoagulated secondary to unsteady gait and risk of falls.  [name] presented to [facility] on January 1 with a 3-day history of progressive dyspnea after testing positive for COVID at home.  She was also noted to have mild confusion and anorexia.  She was initially hypoxic at 81% on room air and required supplemental oxygen, initially with nonrebreather and subsequently with nasal cannula throughout hospitalization.  CT of the chest on January 5 showed a 3.4 x 2.9 centimeters spiculated left upper lobe mass consistent with malignancy.  She has been seen by oncology and critical care medicine with potential plan for bronchoscopy/PET as an outpatient following recovery.    On the morning of January 9, she developed A. fib  with RVR was placed on a Cardizem infusion.  She did convert to sinus rhythm but has had recurrent PAF on multiple occasions, sometimes lasting hours at a time.  Patient has not been particularly symptomatic though rates are often fast.  The medicine team has tried to wean her off of IV diltiazem and onto oral diltiazem however, due to recurrent A. fib, she has required ongoing IV diltiazem and is currently at 5 mg an hour but required as much is 15 mg an hour just yesterday.  Patient is currently in sinus rhythm.  Her base complaint is cough and depression.  She has chronic  dyspnea on exertion and no recent history of chest pain.  Inpatient Medications     vitamin C  500 mg Oral Daily   calcium-vitamin D  2 tablet Oral Daily   cholecalciferol  1,000 Units Oral Daily   citalopram  10 mg Oral Daily   diltiazem  60 mg Oral Q6H   enoxaparin (LOVENOX) injection  30 mg Subcutaneous Q24H   famotidine  20 mg Oral Daily   feeding supplement  1 Container Oral TID BM   guaiFENesin-dextromethorphan  10 mL Oral Q6H   ipratropium  0.5 mg Nebulization Q6H   levalbuterol  0.63 mg Nebulization Q6H   melatonin  2.5 mg Oral QHS   multivitamin with minerals  1 tablet Oral Daily   predniSONE  10 mg Oral Q breakfast   zinc sulfate  220 mg Oral Daily    Family History    Family History  Problem Relation Age of Onset   Ovarian cancer Mother    Heart attack Father    Heart disease Brother    Asthma Other    She indicated that her mother is deceased. She indicated that her father is deceased. She indicated that her brother is alive. She indicated that the status of her other is unknown.   Social History    Social History   Socioeconomic History   Marital status: Widowed    Spouse name: Not on file   Number of children: Not on file   Years of education: Not on file   Highest education level: Not on file  Occupational History   Not on file  Tobacco Use   Smoking status: Former    Types: Cigarettes    Quit date: 07/11/2002    Years since quitting: 19.0   Smokeless tobacco: Never  Vaping Use   Vaping Use: Never used  Substance and Sexual Activity   Alcohol use: Yes    Alcohol/week: 1.0 standard drink    Types: 1 Glasses of wine per week    Comment: occasionally   Drug use: No   Sexual activity: Never  Other Topics Concern   Not on file  Social History Narrative   Not on file   Social Determinants of Health   Financial Resource Strain: Not on file  Food Insecurity: Not on file  Transportation Needs: Not on file  Physical Activity: Not on file   Stress: Not on file  Social Connections: Not on file  Intimate Partner Violence: Not on file     Review of Systems    General:  No chills, fever, night sweats or weight changes.  Cardiovascular:  No chest pain, +++ chronic dyspnea on exertion, no edema, orthopnea, palpitations, paroxysmal nocturnal dyspnea. Dermatological: No rash, lesions/masses Respiratory: +++ Productive cough, +++ chronic dyspnea Urologic: No hematuria, dysuria Abdominal:   +++ Anorexia and weight loss.  No nausea, vomiting,  diarrhea, bright red blood per rectum, melena, or hematemesis Neurologic:  No visual changes, wkns, changes in mental status. All other systems reviewed and are otherwise negative except as noted above.  Physical Exam    Blood pressure (!) 149/67, pulse 81, temperature 97.6 F (36.4 C), resp. rate 18, height 5\' 3"  (1.6 m), weight 45 kg, SpO2 95 %.  General: Pleasant, NAD Psych: Flat affect. Neuro: Alert and oriented X 3. Moves all extremities spontaneously. HEENT: Very hard of hearing.  Neck: Supple without bruits or JVD. Lungs:  Resp regular and unlabored, coarse breath sounds throughout. Heart: RRR no s3, s4, or murmurs. Abdomen: Soft, non-tender, non-distended, BS + x 4.  Extremities: No clubbing, cyanosis or edema. DP/PT1+, Radials 2+ and equal bilaterally.  Labs    Cardiac Enzymes Recent Labs  Lab 07/24/2021 1723 08/08/2021 2029 07/12/21 0101  TROPONINIHS 16 19*   18* 26*      Lab Results  Component Value Date   WBC 8.0 07/20/2021   HGB 12.6 07/20/2021   HCT 38.8 07/20/2021   MCV 96.3 07/20/2021   PLT 357 07/20/2021    Recent Labs  Lab 07/22/21 0739  NA 142  K 3.6  CL 105  CO2 31  BUN 38*  CREATININE 1.24*  CALCIUM 8.7*  PROT 5.5*  BILITOT 0.6  ALKPHOS 48  ALT 17  AST 18  GLUCOSE 103*   Lab Results  Component Value Date   CHOL 204 (A) 05/28/2013   HDL 51 05/28/2013   LDLCALC 133 05/28/2013   TRIG 98 05/28/2013   Lab Results  Component Value Date    DDIMER 0.79 (H) 07/18/2021     Radiology Studies    CT HEAD WO CONTRAST (5MM)  Result Date: 07/16/2021 CLINICAL DATA:  Delirium EXAM: CT HEAD WITHOUT CONTRAST TECHNIQUE: Contiguous axial images were obtained from the base of the skull through the vertex without intravenous contrast. COMPARISON:  None. FINDINGS: Brain: No evidence of acute infarction, hemorrhage, cerebral edema, mass, mass effect, or midline shift. Arachnoid cyst anterior to the left temporal lobe. No hydrocephalus or acute extra-axial fluid collection. Vascular: No hyperdense vessel. Skull: Normal. Negative for fracture or focal lesion. Sinuses/Orbits: No acute finding. Status post bilateral lens replacements. Other: The mastoid air cells are well aerated. IMPRESSION: IMPRESSION No acute intracranial process. Electronically Signed   By: Merilyn Baba M.D.   On: 07/16/2021 11:44   CT CHEST WO CONTRAST  Result Date: 07/15/2021 CLINICAL DATA:  Dyspnea.  Positive COVID-19. EXAM: CT CHEST WITHOUT CONTRAST TECHNIQUE: Multidetector CT imaging of the chest was performed following the standard protocol without IV contrast. COMPARISON:  October 01, 2012. FINDINGS: Cardiovascular: Atherosclerosis of thoracic aorta is noted without aneurysm formation. Mild cardiomegaly is noted. No pericardial effusion is noted. Coronary calcifications are noted. Mediastinum/Nodes: No enlarged mediastinal or axillary lymph nodes. Thyroid gland, trachea, and esophagus demonstrate no significant findings. Lungs/Pleura: Elevated left hemidiaphragm is noted. No pneumothorax or pleural effusion is noted. 3.4 x 2.9 cm spiculated mass is noted laterally in the left upper lobe consistent with malignancy. Minimal right posterior basilar subsegmental atelectasis is noted. Upper Abdomen: No acute abnormality. Musculoskeletal: Probable old lower thoracic vertebral body fracture is noted. No definite acute abnormality is noted. IMPRESSION: 3.4 x 2.9 cm spiculated mass is noted in  left upper lobe consistent with malignancy. PET scan is recommended for further evaluation. Elevated left hemidiaphragm is noted. Aortic Atherosclerosis (ICD10-I70.0). Electronically Signed   By: Marijo Conception M.D.   On: 07/15/2021 15:18  US Venous Img Lower Bilateral (DVT)  Result Date: 07/15/2021 CLINICAL DATA:  Malignancy, elevated D-dimer, COVID positive EXAM: BILATERAL LOWER EXTREMITY VENOUS DOPPLER ULTRASOUND TECHNIQUE: Gray-scale sonography with compression, as well as color and duplex ultrasound, were performed to evaluate the deep venous system(s) from the level of the common femoral vein through the popliteal and proximal calf veins. COMPARISON:  None. FINDINGS: VENOUS Normal compressibility of the common femoral, superficial femoral, and popliteal veins, as well as the visualized calf veins. Visualized portions of profunda femoral vein and great saphenous vein unremarkable. No filling defects to suggest DVT on grayscale or color Doppler imaging. Doppler waveforms show normal direction of venous flow, normal respiratory plasticity and response to augmentation. OTHER None. Limitations: none IMPRESSION: 1. No evidence of deep venous thrombosis within either lower extremity. Electronically Signed   By: Randa Ngo M.D.   On: 07/15/2021 19:43   DG Chest Port 1 View  Result Date: 07/20/2021 CLINICAL DATA:  Shortness of breath. EXAM: PORTABLE CHEST 1 VIEW COMPARISON:  Chest x-ray and CT chest dated July 15, 2021. FINDINGS: The patient is rotated to the left. Stable cardiomediastinal silhouette with normal heart size. The lungs remain hyperinflated. Unchanged peripheral left upper lobe mass. Unchanged mild left basilar atelectasis. The right lung is clear. No pneumothorax or large pleural effusion. Unchanged elevation of the left hemidiaphragm. No acute osseous abnormality. IMPRESSION: 1. Unchanged peripheral left upper lobe mass. Electronically Signed   By: Titus Dubin M.D.   On: 07/20/2021  11:06   DG Chest Port 1 View  Result Date: 07/15/2021 CLINICAL DATA:  Short of breath.  History of COPD. EXAM: PORTABLE CHEST 1 VIEW COMPARISON:  08/08/2021 FINDINGS: Marked elevation left hemidiaphragm unchanged. Density in the left lateral lung base unchanged. This could be pleural or parenchymal in nature. This was not present in 2017. Pulmonary hyperinflation. Negative for heart failure. Atherosclerotic aortic arch. Right lung clear. No significant pleural effusion. IMPRESSION: No interval change. Elevated left hemidiaphragm with density in the left lateral lung base. This could be pleural based or parenchymal. Nonspecific appearance with differential including pneumonia, pleural thickening, and tumor. Recommend CT chest, preferably with intravenous contrast. Electronically Signed   By: Franchot Gallo M.D.   On: 07/15/2021 10:03   DG Chest Port 1 View  Result Date: 07/29/2021 CLINICAL DATA:  88 year old female with history of shortness of breath over the past 3 days. EXAM: PORTABLE CHEST 1 VIEW COMPARISON:  Chest x-ray 02/03/2016. FINDINGS: Severe elevation of the left hemidiaphragm. New opacity in the periphery of the left mid lung. Emphysematous changes throughout the lungs bilaterally. Diffuse peribronchial cuffing. No definite pleural effusions. No pneumothorax. No evidence of pulmonary edema. Heart size is mildly enlarged. Upper mediastinal contours are within normal limits. Atherosclerotic calcifications are noted in the thoracic aorta. IMPRESSION: 1. Opacity in the left mid lung concerning for pneumonia. Alternatively, in the appropriate clinical setting, this could represent hemorrhage from pulmonary infarct. 2. Emphysema. 3. Aortic atherosclerosis. 4. Severe elevation of the left hemidiaphragm. Electronically Signed   By: Vinnie Langton M.D.   On: 07/29/2021 17:49    ECG & Cardiac Imaging    Atrial fibrillation, 163, left axis deviation, LVH, inferior infarct, lateral ST depression-  personally reviewed.  Assessment & Plan    1.  Acute on chronic hypoxic respiratory failure/AE COPD/COVID-19 infection: Admitted January 1 with progressive dyspnea, hypoxia, and COVID-19 infection.  Remains on nasal cannula.  Continues to cough and feels that she has significant secretions.  Currently on steroids  and Combivent.  We will switch her to Xopenex and ipratropium nebulizers.  May also benefit from incentive spirometer.  2.  Paroxysmal atrial fibrillation: In the setting of #1, patient has had paroxysmal atrial fibrillation with difficulty coming off of IV diltiazem.  In evaluating dosing of oral diltiazem that has been tried previously, she may have been underdosed.  She is currently in sinus rhythm in the 70s.  We will discontinue IV diltiazem and place her on oral diltiazem at 60 mg every 6 hours with a plan to consolidate provided that this is effective.  Her CHA2DS2-VASc is 4.  Historically, she has not been anticoagulated secondary to unsteady gait and risk of falls.  We will continue to forego anticoagulation at this time.  3.  Essential hypertension: Blood pressure elevated.  Follow with transition oral diltiazem.  4.  Left upper lobe mass: Seen by pulmonology and oncology with plan for outpatient evaluation pending recovery.  5.  Demand Ischemia:   HsTrop up to 26 in setting of above.  No chest pain.  Follow up echo to re-eval EF.  Signed, Murray Hodgkins, NP 07/22/2021, 4:36 PM  For questions or updates, please contact   Please consult [email or web address] for contact info under Cardiology/STEMI.

## 2021-07-22 NOTE — Progress Notes (Signed)
Patient did not urinate during entire shift. First bladder scan showed 29 cc. Second bladder scan at 1800 showed 195 cc. Patient has not been eating or drinking. IV fluids ordered by MD were started at 1800 after IV team was able to get an IV in. Night shift RN made aware.

## 2021-07-22 NOTE — Consult Note (Signed)
Went to assess client, family in the room visiting.  Will try again later.  Waylan Boga, PMHNP

## 2021-07-22 NOTE — Progress Notes (Signed)
Cardizem gtt infiltrated in patient left wrist IV. Level 3 infiltrate noticed towards upper left elbow with redness. Patient states there is no pain associated with infiltration. Pharmacy consulted - there is no antidote. RN advised to apply warm compress and elevate extremity.

## 2021-07-23 ENCOUNTER — Inpatient Hospital Stay (HOSPITAL_COMMUNITY)
Admit: 2021-07-23 | Discharge: 2021-07-23 | Disposition: A | Discharge status: Home / Self Care | Payer: Medicare Other | Attending: Nurse Practitioner | Admitting: Nurse Practitioner

## 2021-07-23 DIAGNOSIS — R0600 Dyspnea, unspecified: Secondary | ICD-10-CM | POA: Diagnosis not present

## 2021-07-23 DIAGNOSIS — I4891 Unspecified atrial fibrillation: Secondary | ICD-10-CM

## 2021-07-23 DIAGNOSIS — E43 Unspecified severe protein-calorie malnutrition: Secondary | ICD-10-CM | POA: Diagnosis not present

## 2021-07-23 DIAGNOSIS — U071 COVID-19: Secondary | ICD-10-CM | POA: Diagnosis not present

## 2021-07-23 DIAGNOSIS — J189 Pneumonia, unspecified organism: Secondary | ICD-10-CM | POA: Diagnosis not present

## 2021-07-23 DIAGNOSIS — J441 Chronic obstructive pulmonary disease with (acute) exacerbation: Secondary | ICD-10-CM

## 2021-07-23 DIAGNOSIS — J9601 Acute respiratory failure with hypoxia: Secondary | ICD-10-CM | POA: Diagnosis not present

## 2021-07-23 LAB — CBC
HCT: 33.9 % — ABNORMAL LOW (ref 36.0–46.0)
Hemoglobin: 11.1 g/dL — ABNORMAL LOW (ref 12.0–15.0)
MCH: 31.5 pg (ref 26.0–34.0)
MCHC: 32.7 g/dL (ref 30.0–36.0)
MCV: 96.3 fL (ref 80.0–100.0)
Platelets: 276 10*3/uL (ref 150–400)
RBC: 3.52 MIL/uL — ABNORMAL LOW (ref 3.87–5.11)
RDW: 13.5 % (ref 11.5–15.5)
WBC: 10.8 10*3/uL — ABNORMAL HIGH (ref 4.0–10.5)
nRBC: 0 % (ref 0.0–0.2)

## 2021-07-23 LAB — BASIC METABOLIC PANEL
Anion gap: 8 (ref 5–15)
BUN: 38 mg/dL — ABNORMAL HIGH (ref 8–23)
CO2: 29 mmol/L (ref 22–32)
Calcium: 9.1 mg/dL (ref 8.9–10.3)
Chloride: 105 mmol/L (ref 98–111)
Creatinine, Ser: 1.23 mg/dL — ABNORMAL HIGH (ref 0.44–1.00)
GFR, Estimated: 42 mL/min — ABNORMAL LOW (ref >60)
Glucose, Bld: 109 mg/dL — ABNORMAL HIGH (ref 70–99)
Potassium: 3.6 mmol/L (ref 3.5–5.1)
Sodium: 142 mmol/L (ref 135–145)

## 2021-07-23 LAB — ECHOCARDIOGRAM COMPLETE
AR max vel: 1.98 cm2
AV Area VTI: 2.15 cm2
AV Area mean vel: 1.75 cm2
AV Mean grad: 6 mmHg
AV Peak grad: 12.3 mmHg
Ao pk vel: 1.75 m/s
Area-P 1/2: 2.53 cm2
Height: 63 in
MV VTI: 1.89 cm2
S' Lateral: 1.96 cm
Weight: 1588.8 oz

## 2021-07-23 LAB — MAGNESIUM: Magnesium: 2.3 mg/dL (ref 1.7–2.4)

## 2021-07-23 MED ORDER — MORPHINE SULFATE (PF) 2 MG/ML IV SOLN
2.0000 mg | INTRAVENOUS | Status: DC | PRN
  Administered 2021-07-24: 2 mg via INTRAVENOUS
  Filled 2021-07-23: qty 1

## 2021-07-23 NOTE — Progress Notes (Signed)
Progress Note  Patient Name: [name] [name] Date of Encounter: 07/23/2021  Primary Cardiologist: Ida Rogue, MD  Subjective   With nebs, she feels that cough has been more productive.  Breathing not yet noticeably improved.  She has been maintaining sinus rhythm on oral dilt.  Inpatient Medications    Scheduled Meds:  vitamin C  500 mg Oral Daily   calcium-vitamin D  2 tablet Oral Daily   cholecalciferol  1,000 Units Oral Daily   citalopram  10 mg Oral Daily   diltiazem  60 mg Oral Q6H   enoxaparin (LOVENOX) injection  30 mg Subcutaneous Q24H   famotidine  20 mg Oral Daily   feeding supplement  1 Container Oral TID BM   guaiFENesin-dextromethorphan  10 mL Oral Q6H   ipratropium  0.5 mg Nebulization Q6H   levalbuterol  0.63 mg Nebulization Q6H   melatonin  2.5 mg Oral QHS   multivitamin with minerals  1 tablet Oral Daily   predniSONE  10 mg Oral Q breakfast   sodium chloride HYPERTONIC  4 mL Nebulization Daily   zinc sulfate  220 mg Oral Daily   Continuous Infusions:  sodium chloride 75 mL/hr at 07/23/21 0301   PRN Meds: acetaminophen, benzonatate, chlorpheniramine-HYDROcodone, HYDROcodone-acetaminophen, magnesium hydroxide, ondansetron **OR** ondansetron (ZOFRAN) IV   Vital Signs    Vitals:   07/22/21 2310 07/23/21 0349 07/23/21 0746 07/23/21 0940  BP: (!) 141/60 (!) 126/55  (!) 140/127  Pulse: 79 (!) 103  64  Resp: 18   18  Temp: 98 F (36.7 C) 97.8 F (36.6 C)  (!) 97.3 F (36.3 C)  TempSrc: Oral Oral  Oral  SpO2: 94% 99% 95% 91%  Weight:      Height:        Intake/Output Summary (Last 24 hours) at 07/23/2021 1031 Last data filed at 07/23/2021 0200 Gross per 24 hour  Intake 671.57 ml  Output 250 ml  Net 421.57 ml   Filed Weights   08/01/2021 1706  Weight: 45 kg    Physical Exam   GEN: Frail in no acute distress.  HEENT: Very hard of hearing. Neck: Supple, no JVD, carotid bruits, or masses. Cardiac: RRR, distant heart sounds.  No murmurs,  rubs, or gallops. No clubbing, cyanosis, edema.  Radials 2+, DP/PT 2+ and equal bilaterally.  Respiratory:  Respirations regular and unlabored, coarse breath sounds throughout. GI: Soft, nontender, nondistended, BS + x 4. MS: no deformity or atrophy. Skin: warm and dry, no rash. Neuro:  Strength and sensation are intact. Psych: AAOx3.  Normal affect.  Labs    Chemistry Recent Labs  Lab 07/17/21 [phone] 07/18/21 0559 07/20/21 0748 07/22/21 0739 07/23/21 0547  NA 136 138 141 142 142  K 4.0 4.0 3.8 3.6 3.6  CL 100 101 102 105 105  CO2 28 31 28 31 29   GLUCOSE 138* 134* 104* 103* 109*  BUN 42* 41* 30* 38* 38*  CREATININE 1.24* 1.22* 0.96 1.24* 1.23*  CALCIUM 8.8* 8.8* 9.1 8.7* 9.1  PROT 5.1* 5.2*  --  5.5*  --   ALBUMIN 3.1* 3.1*  --  3.4*  --   AST 19 18  --  18  --   ALT 17 17  --  17  --   ALKPHOS 48 47  --  48  --   BILITOT 0.6 0.7  --  0.6  --   GFRNONAA 42* 42* 57* 42* 42*  ANIONGAP 8 6 11 6  8  Hematology Recent Labs  Lab 07/20/21 0748 07/23/21 0547  WBC 8.0 10.8*  RBC 4.03 3.52*  HGB 12.6 11.1*  HCT 38.8 33.9*  MCV 96.3 96.3  MCH 31.3 31.5  MCHC 32.5 32.7  RDW 13.2 13.5  PLT 357 276    Cardiac Enzymes  Recent Labs  Lab 07/26/2021 1723 07/27/2021 2029 07/12/21 0101  TROPONINIHS 16 19*   18* 26*     DDimer  Recent Labs  Lab 07/18/21 0559  DDIMER 0.79*     Lipids  Lab Results  Component Value Date   CHOL 204 (A) 05/28/2013   HDL 51 05/28/2013   LDLCALC 133 05/28/2013   TRIG 98 05/28/2013    HbA1c  Lab Results  Component Value Date   HGBA1C 5.5 05/18/2017    Radiology    DG Chest Port 1 View  Result Date: 07/20/2021 CLINICAL DATA:  Shortness of breath. EXAM: PORTABLE CHEST 1 VIEW COMPARISON:  Chest x-ray and CT chest dated July 15, 2021. FINDINGS: The patient is rotated to the left. Stable cardiomediastinal silhouette with normal heart size. The lungs remain hyperinflated. Unchanged peripheral left upper lobe mass. Unchanged mild left  basilar atelectasis. The right lung is clear. No pneumothorax or large pleural effusion. Unchanged elevation of the left hemidiaphragm. No acute osseous abnormality. IMPRESSION: 1. Unchanged peripheral left upper lobe mass. Electronically Signed   By: Titus Dubin M.D.   On: 07/20/2021 11:06    Telemetry    She has been maintaining sinus rhythm with only brief runs of PACs or atrial tachycardia.- Personally Reviewed  Cardiac Studies   Cardiac Catheterization  5.2008  Nl cors. Nl filling pressures.  2D Echocardiogram 12/2006  EF 70-75%, Gr1 DD; b. 11/2013 Echo: EF 55-60%, mild MR/TR. _____________   Patient Profile     88 y.o. female with a history of COPD, emphysema, hypertension, cataracts, and paroxysmal atrial fibrillation, who was admitted 1/1 w/ resp failure/COVID-19 infection and finding of LUL lung mass, and developed PAF on 07/19/2021.  Assessment & Plan    1.  Acute on chronic hypoxic respiratory failure/AE COPD/COVID-19 infection: Admitted January 1 with progressive dyspnea, hypoxia, and COVID-19 infection.  Cough seems to be more productive over the past 24 hours after being switched to around-the-clock nebulizers.  Steroid management per primary team.  She is also been using incentive spirometer throughout the day.  2.  Paroxysmal atrial fibrillation: In the setting of #1, she has had paroxysmal A. fib.  We switched her from IV diltiazem to oral diltiazem at 60 mg every 6 hours yesterday, and she has been maintaining sinus rhythm with only a few very brief runs of atrial tachycardia.  She still requires her pills to be crushed and therefore, we will continue short acting diltiazem until she can reliably swallow pills, at which point we can switch her to diltiazem CD2 140 mg daily.  As previously noted, she has a history of unsteady gait and risk for falls and has not been anticoagulated as an outpatient.  CHA2DS2-VASc equals 4.  3.  Essential hypertension: Stable.  4.  Left  upper lobe mass: Seen by pulmonology and oncology with plan for outpatient evaluation pending recovery.  5.  Demand ischemia: High-sensitivity troponin up to 26 in the setting of above.  She has not had any chest pain.  Echo pending today.  Signed, Murray Hodgkins, NP  07/23/2021, 10:31 AM    For questions or updates, please contact   Please consult [email or web address] for contact info under Cardiology/STEMI.

## 2021-07-23 NOTE — Progress Notes (Signed)
PT Cancellation Note  Patient Details Name: MIRAI GREENWOOD MRN: [id number] DOB: [birth year]   Cancelled Treatment:    Reason Eval/Treat Not Completed: Other (comment).  Pt resting in bed upon PT arrival.  Pt declining any OOB mobility d/t already being up and down multiple times today and was too tired to get up again.  Therapist attempted to encourage OOB mobility but pt continued to refuse.  Therapist attempted to have pt participate in LE ex's in bed to improve overall strength and initially pt appearing agreeable but then pt also refusing to participate in any ex's d/t being too tired.  Will re-attempt PT treatment session at a later date/time.  Leitha Bleak, PT 07/23/21, 4:17 PM

## 2021-07-23 NOTE — Progress Notes (Signed)
Mobility Specialist - Progress Note   07/23/21 1100  Mobility  Activity Refused mobility  Mobility performed by Mobility specialist    Pt declined mobility, no reason specified. Encouragement for participation, pt requests to attempt later this date. Will attempt session this afternoon if time permits.    Kathee Delton Mobility Specialist 07/23/21, 11:26 AM

## 2021-07-23 NOTE — Progress Notes (Signed)
*  PRELIMINARY RESULTS* Echocardiogram 2D Echocardiogram has been performed.  [name] [name] 07/23/2021, 9:34 AM

## 2021-07-23 NOTE — Progress Notes (Signed)
Mobility Specialist - Progress Note   07/23/21 1500  Mobility  Activity Transferred to/from [facility]  Level of Assistance Minimal assist, patient does 75% or more  Assistive Device BSC  Distance Ambulated (ft) 2 ft  Mobility Out of bed for toileting  Mobility Response Tolerated well  Mobility performed by Mobility specialist  $Mobility charge 1 Mobility    Pt sitting EOB upon arrival, requesting to return to bed. Pt voiced need to void. MinA to SPT to [facility] for urinal out prior to return to bed. Pt returned supine with HOB elevated. O2 desat to 78% with transfer on 5L. O2 taking increased time to achieve > 88% ~ 2 minutes. Labored breathing noted. RN notified. O2 90% prior to exit. RT entered for breathing tx. Pt left in bed with alarm set, needs in reach.    Kathee Delton Mobility Specialist 07/23/21, 3:16 PM

## 2021-07-23 NOTE — Progress Notes (Addendum)
Progress Note:    [name] [name]    SWN:[id number] DOB: 1929-10-22 DOA: 07/24/2021  PCP: Jerrol Banana., MD    Brief Narrative:   This patient is a 88 year old female with history of asthma/COPD, hypertension, paroxysmal A. fib who presented here from home with complaints of acute onset of worsening dyspnea, poor oral intake, cough, confusion.  On presentation, she was hypoxic on room air and had to be placed on 10 L of oxygen.  Chest x-ray showed opacity in the left midlung concerning for pneumonia, emphysema.  COVID screen test came out  to be positive.  Hospital course remarkable for worsening confusion, hypoxia.  Overall status was improving, nearing to discharge.  But in the morning of 07/19/2021, she went into A. fib with RVR requiring Cardizem drip.      Subjective:   Currently on 3L. Spoke with daughter at bedside. Still some mild wheezing. She states she is not ready to go home.  Assessment and Plan:   Acute hypoxic respiratory failure secondary to COVID-19 infection/COPD exacerbation: Currently on 3 liters oxygen and saturating well.  COVID-19 test was positive on 08/10/2021.  D-dimer was elevated and has trended down but I have low suspicion for PE.  Completed baricitinib on 1/5.  Completed course of remdesivir.  Continue oral steroids.  PCCM consulted and following loosely.  Discontinue isolation precautions. Continue Xoponex and Atrovent nebs as well as FV, ICS.Continue hypertonic nebs.  Left upper lobe lung mass: CT chest done on 07/15/2021 showed 3.4 x 2.9 cm spiculated mass consistent with malignancy.  Medical oncology and PCCM were consulted.  PCCM planning for bronchoscopy/PET scan as an outpatient.   Paroxysmal A. fib: Has history of A. fib, not on anticoagulation due to advanced age, history of rectal bleeding,risk of falls  TSH WNL. Cardiology consulted. Now of Cardizem drip and on short acting cardizem.   History of COPD: Currently on steroids.    Acute  metabolic encephalopathy/delirium: Likely exacerbated from [place].CT head did not show any acute intracranial findings.  Monitor mental status.  Delirium precautions.  Currently alert and oriented x3.   CKD stage II: Currently kidney function at baseline.   Hypertension: Currently BP stable.  Holding home Hyzaar.   Severe protein calorie malnutrition: Dietitian following   Debility/deconditioning: She lives alone.  Her children live nearby.  PT/OT recommend HHPT/OT upon discharge.   Goals of care: Very elderly patient with multiple comorbidities now with left upper lobe lung mass with possible malignancy.  Palliative care consulted for goals of care. Partial code.    Other information:    DVT prophylaxis: Lovenox subcu Code Status: Partial code Family Communication: Son was at side upon my evaluation Disposition:   Status is: Inpatient  Remains inpatient appropriate because: Hypoxic and on diltiazem drip     Consultants:   PCCM, Oncology, cardiology    Objective:    Vitals:   07/23/21 0940 07/23/21 1254 07/23/21 1330 07/23/21 1627  BP: (!) 140/127 (!) 155/69  (!) 144/58  Pulse: 64 82  78  Resp: 18 19  18   Temp: (!) 97.3 F (36.3 C) 97.9 F (36.6 C)  97.8 F (36.6 C)  TempSrc: Oral   Oral  SpO2: 91% 91% 90% 92%  Weight:      Height:        Intake/Output Summary (Last 24 hours) at 07/23/2021 1722 Last data filed at 07/23/2021 1300 Gross per 24 hour  Intake 1336.14 ml  Output 250 ml  Net 1086.14 ml    Filed Weights   07/24/2021 1706  Weight: 45 kg       Physical Exam:    General exam: Appears calm and comfortable, currently on 3 L oxygen Respiratory system: expiratory wheezing Cardiovascular system: Regular rhythm with regular rate Gastrointestinal system: Abdomen is nondistended, soft and nontender. No organomegaly or masses felt. Normal bowel sounds heard. Central nervous system: Alert and oriented, place and month and president. No focal  neurological deficits. Extremities: Symmetric 5 x 5 power. Skin: No rashes, lesions or ulcers Psychiatry: Judgement and insight appear normal.  Somewhat of a labile mood    Data Reviewed:    I have personally reviewed following labs and imaging studies  CBC: Recent Labs  Lab 07/20/21 0748 07/23/21 0547  WBC 8.0 10.8*  NEUTROABS 5.5  --   HGB 12.6 11.1*  HCT 38.8 33.9*  MCV 96.3 96.3  PLT 357 276     Basic Metabolic Panel: Recent Labs  Lab 07/17/21 0652 07/18/21 0559 07/20/21 0748 07/22/21 0739 07/23/21 0547  NA 136 138 141 142 142  K 4.0 4.0 3.8 3.6 3.6  CL 100 101 102 105 105  CO2 28 31 28 31 29   GLUCOSE 138* 134* 104* 103* 109*  BUN 42* 41* 30* 38* 38*  CREATININE 1.24* 1.22* 0.96 1.24* 1.23*  CALCIUM 8.8* 8.8* 9.1 8.7* 9.1  MG  --   --   --  1.4* 2.3     GFR: Estimated Creatinine Clearance: 22 mL/min (A) (by C-G formula based on SCr of 1.23 mg/dL (H)).  Liver Function Tests: Recent Labs  Lab 07/17/21 0652 07/18/21 0559 07/22/21 0739  AST 19 18 18   ALT 17 17 17   ALKPHOS 48 47 48  BILITOT 0.6 0.7 0.6  PROT 5.1* 5.2* 5.5*  ALBUMIN 3.1* 3.1* 3.4*     CBG: No results for input(s): GLUCAP in the last 168 hours.   No results found for this or any previous visit (from the past 240 hour(s)).        Radiology Studies:    No results found.      Medications:    Scheduled Meds:  vitamin C  500 mg Oral Daily   calcium-vitamin D  2 tablet Oral Daily   cholecalciferol  1,000 Units Oral Daily   citalopram  10 mg Oral Daily   diltiazem  60 mg Oral Q6H   enoxaparin (LOVENOX) injection  30 mg Subcutaneous Q24H   famotidine  20 mg Oral Daily   feeding supplement  1 Container Oral TID BM   guaiFENesin-dextromethorphan  10 mL Oral Q6H   ipratropium  0.5 mg Nebulization Q6H   levalbuterol  0.63 mg Nebulization Q6H   melatonin  2.5 mg Oral QHS   multivitamin with minerals  1 tablet Oral Daily   predniSONE  10 mg Oral Q breakfast   sodium  chloride HYPERTONIC  4 mL Nebulization Daily   zinc sulfate  220 mg Oral Daily   Continuous Infusions:  sodium chloride 75 mL/hr at 07/23/21 0301       LOS: 12 days    Time spent: 35 minutes    Leslee Home, MD [facility]   To contact the attending provider between 7A-7P or the covering provider during after hours 7P-7A, please log into the web site [email or web address] and access using universal [place] password for that web site. If you do not have the password, please call the hospital operator.  07/23/2021, 5:22 PM

## 2021-07-24 MED ORDER — POLYETHYLENE GLYCOL 3350 17 G PO PACK
17.0000 g | PACK | Freq: Every day | ORAL | Status: DC
  Administered 2021-07-24 – 2021-07-28 (×3): 17 g via ORAL
  Filled 2021-07-24 (×4): qty 1

## 2021-07-24 MED ORDER — IPRATROPIUM-ALBUTEROL 0.5-2.5 (3) MG/3ML IN SOLN
3.0000 mL | RESPIRATORY_TRACT | Status: DC | PRN
  Administered 2021-07-25 (×2): 3 mL via RESPIRATORY_TRACT
  Filled 2021-07-24 (×2): qty 3

## 2021-07-24 NOTE — Progress Notes (Addendum)
Progress Note:    [name] [name]    JME:[id number] DOB: 11-May-1930 DOA: 07/18/2021  PCP: Jerrol Banana., MD    Brief Narrative:   This patient is a 88 year old female with history of asthma/COPD, hypertension, paroxysmal A. fib who presented here from home with complaints of acute onset of worsening dyspnea, poor oral intake, cough, confusion.  On presentation, she was hypoxic on room air and had to be placed on 10 L of oxygen.  Chest x-ray showed opacity in the left midlung concerning for pneumonia, emphysema.  COVID screen test came out  to be positive.  Hospital course remarkable for worsening confusion, hypoxia.  Overall status was improving, nearing to discharge.  But in the morning of 07/19/2021, she went into A. fib with RVR requiring Cardizem drip.      Subjective:   Currently on 4L. No complaints. Tolerating diet.  Assessment and Plan:   Acute hypoxic respiratory failure secondary to COVID-19 infection/COPD exacerbation/malignancy: Currently on 3-4 liters oxygen and saturating well.  COVID-19 test was positive on 07/24/2021.  D-dimer was elevated and has trended downtrended. Completed course steroids. PCCM consulted and following loosely.  Discontinue isolation precautions. Continue Xoponex and Atrovent nebs as well as FV, ICS. Continue hypertonic nebs. Will likely d/c on o2.  Debility: PT advising hh pt, daughter doesn't think capable, will ask PT to re-eval  Left upper lobe lung mass: CT chest done on 07/15/2021 showed 3.4 x 2.9 cm spiculated mass consistent with malignancy.  Medical oncology and PCCM were consulted.  PCCM planning for bronchoscopy/PET scan as an outpatient.   Paroxysmal A. fib: Has history of A. fib, not on anticoagulation due to advanced age, history of rectal bleeding,risk of falls  TSH WNL. Cardiology consulted. Now of Cardizem drip and on short acting cardizem, can transition to XL when able to tolerate larger pills  History of COPD: cont  inhalers   Acute metabolic encephalopathy/delirium: Likely exacerbated from [place].CT head did not show any acute intracranial findings.  Monitor mental status.  Delirium precautions.     CKD stage II: Currently kidney function at baseline.   Hypertension: Currently BP stable.  Holding home Hyzaar.   Severe protein calorie malnutrition: Dietitian following   Debility/deconditioning: She lives alone.  Her children live nearby.  PT/OT recommend HHPT/OT upon discharge.   Goals of care: Very elderly patient with multiple comorbidities now with left upper lobe lung mass with possible malignancy.  Palliative care consulted for goals of care. Partial code.    Other information:    DVT prophylaxis: Lovenox subcu Code Status: DNR, confirmed w/ daughter 1/14 Family Communication: daughter updated telephonically 1/14 Disposition:   Status is: Inpatient  Remains inpatient appropriate because: unsafe d/c plan     Consultants:   PCCM, Oncology, cardiology    Objective:    Vitals:   07/24/21 0001 07/24/21 0009 07/24/21 0511 07/24/21 0829  BP: (!) 169/71 (!) 153/70 135/78 (!) 128/47  Pulse: (!) 103 97 99 71  Resp: 20 20 20 20   Temp: 98.4 F (36.9 C)  (!) 97.5 F (36.4 C) 98.7 F (37.1 C)  TempSrc:      SpO2: (!) 88% 91% 94% 95%  Weight:      Height:        Intake/Output Summary (Last 24 hours) at 07/24/2021 0906 Last data filed at 07/24/2021 0139 Gross per 24 hour  Intake 736.14 ml  Output 1 ml  Net 735.14 ml   [employer]  07/14/2021 1706  Weight: 45 kg       Physical Exam:    General exam: Appears calm and comfortable, Respiratory system:  bibasilar rales, otherwise clear Cardiovascular system: Regular rhythm with regular rate Gastrointestinal system: Abdomen is nondistended, soft and nontender. No organomegaly or masses felt. Normal bowel sounds heard. Central nervous system: Alert and oriented, place and month and president. No focal neurological  deficits. Extremities: Symmetric 5 x 5 power. Skin: No rashes, lesions or ulcers Psychiatry: calm, confused    Data Reviewed:    I have personally reviewed following labs and imaging studies  CBC: Recent Labs  Lab 07/20/21 0748 07/23/21 0547  WBC 8.0 10.8*  NEUTROABS 5.5  --   HGB 12.6 11.1*  HCT 38.8 33.9*  MCV 96.3 96.3  PLT 357 098    Basic Metabolic Panel: Recent Labs  Lab 07/18/21 0559 07/20/21 0748 07/22/21 0739 07/23/21 0547  NA 138 141 142 142  K 4.0 3.8 3.6 3.6  CL 101 102 105 105  CO2 31 28 31 29   GLUCOSE 134* 104* 103* 109*  BUN 41* 30* 38* 38*  CREATININE 1.22* 0.96 1.24* 1.23*  CALCIUM 8.8* 9.1 8.7* 9.1  MG  --   --  1.4* 2.3    GFR: Estimated Creatinine Clearance: 22 mL/min (A) (by C-G formula based on SCr of 1.23 mg/dL (H)).  Liver Function Tests: Recent Labs  Lab 07/18/21 0559 07/22/21 0739  AST 18 18  ALT 17 17  ALKPHOS 47 48  BILITOT 0.7 0.6  PROT 5.2* 5.5*  ALBUMIN 3.1* 3.4*    CBG: No results for input(s): GLUCAP in the last 168 hours.   No results found for this or any previous visit (from the past 240 hour(s)).        Radiology Studies:    ECHOCARDIOGRAM COMPLETE  Result Date: 07/23/2021    ECHOCARDIOGRAM REPORT   Patient Name:   [name] [name] Date of Exam: 07/23/2021 Medical Rec #:  [id number]          Height:       63.0 in Accession #:    [id number]         Weight:       99.3 lb Date of Birth:  April 28, 1930           BSA:          1.436 m Patient Age:    34 years           BP:           126/55 mmHg Patient Gender: F                  HR:           67 bpm. Exam Location:  [facility] Procedure: 2D Echo, Color Doppler and Cardiac Doppler Indications:     I48.91 Atrial fibrillation  History:         Patient has no prior history of Echocardiogram examinations.                  COPD; Risk Factors:Hypertension.  Sonographer:     Charmayne Sheer Referring Phys:  [id number] [name] [name] Diagnosing Phys: Ida Rogue MD  Sonographer  Comments: Suboptimal subcostal window. Image acquisition challenging due to patient body habitus. IMPRESSIONS  1. Left ventricular ejection fraction, by estimation, is 60 to 65%. The left ventricle has normal function. The left ventricle has no regional wall motion abnormalities. Left ventricular diastolic parameters are consistent with  Grade I diastolic dysfunction (impaired relaxation).  2. Right ventricular systolic function is normal. The right ventricular size is normal. Tricuspid regurgitation signal is inadequate for assessing PA pressure.  3. The mitral valve is normal in structure. No evidence of mitral valve regurgitation. No evidence of mitral stenosis.  4. The aortic valve was not well visualized. Aortic valve regurgitation is not visualized. No aortic stenosis is present.  5. The inferior vena cava is normal in size with greater than 50% respiratory variability, suggesting right atrial pressure of 3 mmHg. FINDINGS  Left Ventricle: Left ventricular ejection fraction, by estimation, is 60 to 65%. The left ventricle has normal function. The left ventricle has no regional wall motion abnormalities. The left ventricular internal cavity size was normal in size. There is  no left ventricular hypertrophy. Left ventricular diastolic parameters are consistent with Grade I diastolic dysfunction (impaired relaxation). Right Ventricle: The right ventricular size is normal. No increase in right ventricular wall thickness. Right ventricular systolic function is normal. Tricuspid regurgitation signal is inadequate for assessing PA pressure. Left Atrium: Left atrial size was normal in size. Right Atrium: Right atrial size was normal in size. Pericardium: There is no evidence of pericardial effusion. Mitral Valve: The mitral valve is normal in structure. No evidence of mitral valve regurgitation. No evidence of mitral valve stenosis. MV peak gradient, 6.0 mmHg. The mean mitral valve gradient is 2.0 mmHg. Tricuspid Valve:  The tricuspid valve is normal in structure. Tricuspid valve regurgitation is mild . No evidence of tricuspid stenosis. Aortic Valve: The aortic valve was not well visualized. Aortic valve regurgitation is not visualized. No aortic stenosis is present. Aortic valve mean gradient measures 6.0 mmHg. Aortic valve peak gradient measures 12.2 mmHg. Aortic valve area, by VTI measures 2.15 cm. Pulmonic Valve: The pulmonic valve was normal in structure. Pulmonic valve regurgitation is not visualized. No evidence of pulmonic stenosis. Aorta: The aortic root is normal in size and structure. Venous: The inferior vena cava is normal in size with greater than 50% respiratory variability, suggesting right atrial pressure of 3 mmHg. IAS/Shunts: No atrial level shunt detected by color flow Doppler.  LEFT VENTRICLE PLAX 2D LVIDd:         3.33 cm   Diastology LVIDs:         1.96 cm   LV e' medial:    7.94 cm/s LV PW:         0.84 cm   LV E/e' medial:  9.5 LV IVS:        0.79 cm   LV e' lateral:   8.49 cm/s LVOT diam:     1.90 cm   LV E/e' lateral: 8.9 LV SV:         65 LV SV Index:   45 LVOT Area:     2.84 cm  RIGHT VENTRICLE RV Basal diam:  2.62 cm RV S prime:     12.60 cm/s LEFT ATRIUM             Index        RIGHT ATRIUM           Index LA diam:        2.10 cm 1.46 cm/m   RA Area:     11.00 cm LA Vol (A2C):   32.5 ml 22.63 ml/m  RA Volume:   22.30 ml  15.53 ml/m LA Vol (A4C):   33.5 ml 23.33 ml/m LA Biplane Vol: 35.4 ml 24.65 ml/m  AORTIC VALVE  PULMONIC VALVE AV Area (Vmax):    1.98 cm      PV Vmax:       1.00 m/s AV Area (Vmean):   1.75 cm      PV Vmean:      60.300 cm/s AV Area (VTI):     2.15 cm      PV VTI:        0.170 m AV Vmax:           175.00 cm/s   PV Peak grad:  4.0 mmHg AV Vmean:          118.000 cm/s  PV Mean grad:  2.0 mmHg AV VTI:            0.304 m AV Peak Grad:      12.2 mmHg AV Mean Grad:      6.0 mmHg LVOT Vmax:         122.00 cm/s LVOT Vmean:        72.700 cm/s LVOT VTI:           0.230 m LVOT/AV VTI ratio: 0.76  AORTA Ao Root diam: 3.00 cm MITRAL VALVE MV Area (PHT): 2.53 cm     SHUNTS MV Area VTI:   1.89 cm     Systemic VTI:  0.23 m MV Peak grad:  6.0 mmHg     Systemic Diam: 1.90 cm MV Mean grad:  2.0 mmHg MV Vmax:       1.22 m/s MV Vmean:      58.8 cm/s MV Decel Time: 300 msec MV E velocity: 75.80 cm/s MV A velocity: 112.00 cm/s MV E/A ratio:  0.68 Ida Rogue MD Electronically signed by Ida Rogue MD Signature Date/Time: 07/23/2021/6:08:05 PM    Final         Medications:    Scheduled Meds:  vitamin C  500 mg Oral Daily   calcium-vitamin D  2 tablet Oral Daily   cholecalciferol  1,000 Units Oral Daily   citalopram  10 mg Oral Daily   diltiazem  60 mg Oral Q6H   enoxaparin (LOVENOX) injection  30 mg Subcutaneous Q24H   famotidine  20 mg Oral Daily   feeding supplement  1 Container Oral TID BM   guaiFENesin-dextromethorphan  10 mL Oral Q6H   ipratropium  0.5 mg Nebulization Q6H   levalbuterol  0.63 mg Nebulization Q6H   melatonin  2.5 mg Oral QHS   multivitamin with minerals  1 tablet Oral Daily   predniSONE  10 mg Oral Q breakfast   zinc sulfate  220 mg Oral Daily   Continuous Infusions:      LOS: 13 days    Time spent: 35 minutes    Desma Maxim, MD [facility]   To contact the attending provider between 7A-7P or the covering provider during after hours 7P-7A, please log into the web site [email or web address] and access using universal [place] password for that web site. If you do not have the password, please call the hospital operator.  07/24/2021, 9:06 AM

## 2021-07-25 ENCOUNTER — Inpatient Hospital Stay: Payer: Medicare Other

## 2021-07-25 DIAGNOSIS — R918 Other nonspecific abnormal finding of lung field: Secondary | ICD-10-CM | POA: Diagnosis not present

## 2021-07-25 DIAGNOSIS — J810 Acute pulmonary edema: Secondary | ICD-10-CM | POA: Diagnosis not present

## 2021-07-25 DIAGNOSIS — J9601 Acute respiratory failure with hypoxia: Secondary | ICD-10-CM | POA: Diagnosis not present

## 2021-07-25 DIAGNOSIS — U071 COVID-19: Secondary | ICD-10-CM | POA: Diagnosis not present

## 2021-07-25 LAB — BLOOD GAS, ARTERIAL
Acid-Base Excess: 8.1 mmol/L — ABNORMAL HIGH (ref 0.0–2.0)
Bicarbonate: 32 mmol/L — ABNORMAL HIGH (ref 20.0–28.0)
FIO2: 0.32
O2 Saturation: 58.2 %
Patient temperature: 37
pCO2 arterial: 41 mmHg (ref 32.0–48.0)
pH, Arterial: 7.5 — ABNORMAL HIGH (ref 7.350–7.450)
pO2, Arterial: <31 mmHg — CL (ref 83.0–108.0)

## 2021-07-25 LAB — BASIC METABOLIC PANEL
Anion gap: 10 (ref 5–15)
BUN: 30 mg/dL — ABNORMAL HIGH (ref 8–23)
CO2: 27 mmol/L (ref 22–32)
Calcium: 8.9 mg/dL (ref 8.9–10.3)
Chloride: 105 mmol/L (ref 98–111)
Creatinine, Ser: 1.1 mg/dL — ABNORMAL HIGH (ref 0.44–1.00)
GFR, Estimated: 48 mL/min — ABNORMAL LOW (ref >60)
Glucose, Bld: 108 mg/dL — ABNORMAL HIGH (ref 70–99)
Potassium: 3.1 mmol/L — ABNORMAL LOW (ref 3.5–5.1)
Sodium: 142 mmol/L (ref 135–145)

## 2021-07-25 MED ORDER — GLYCOPYRROLATE 0.2 MG/ML IJ SOLN
0.1000 mg | Freq: Once | INTRAMUSCULAR | Status: AC
  Administered 2021-07-25: 0.1 mg via INTRAVENOUS
  Filled 2021-07-25: qty 0.5

## 2021-07-25 MED ORDER — POTASSIUM CHLORIDE CRYS ER 20 MEQ PO TBCR
40.0000 meq | EXTENDED_RELEASE_TABLET | Freq: Once | ORAL | Status: AC
  Administered 2021-07-25: 40 meq via ORAL
  Filled 2021-07-25: qty 2

## 2021-07-25 MED ORDER — FUROSEMIDE 10 MG/ML IJ SOLN
40.0000 mg | Freq: Once | INTRAMUSCULAR | Status: DC
Start: 2021-07-25 — End: 2021-07-25

## 2021-07-25 MED ORDER — HALOPERIDOL LACTATE 5 MG/ML IJ SOLN
1.0000 mg | Freq: Once | INTRAMUSCULAR | Status: AC
  Administered 2021-07-25: 1 mg via INTRAVENOUS
  Filled 2021-07-25: qty 1

## 2021-07-25 MED ORDER — BENZONATATE 100 MG PO CAPS
200.0000 mg | ORAL_CAPSULE | Freq: Three times a day (TID) | ORAL | Status: DC
  Administered 2021-07-25 – 2021-07-27 (×2): 200 mg via ORAL
  Filled 2021-07-25 (×6): qty 2

## 2021-07-25 MED ORDER — FUROSEMIDE 10 MG/ML IJ SOLN
40.0000 mg | Freq: Two times a day (BID) | INTRAMUSCULAR | Status: DC
  Administered 2021-07-25 – 2021-07-26 (×3): 40 mg via INTRAVENOUS
  Filled 2021-07-25 (×4): qty 4

## 2021-07-25 NOTE — Progress Notes (Signed)
Physical Therapy Treatment Patient Details Name: DONNABELLE BLANCHARD MRN: [id number] DOB: 07/30/1929 Today's Date: 07/25/2021   History of Present Illness Gurnoor Heinkel is an 65yoF PMH: asthma, COPD, HOH, HTN, comes to ED with acute onset of worsening dyspnea over the last 3 days. COVID-19 + on 07/08/21.  Now off COVID precautions    PT Comments    Pt resting in bed 2 lpm sat 87%.  Contacted RN who stated ok to increase O2 to 3 lpm and sats increase to 89/90%.  She stated she needed to void and was able to get to EOB with supervision and steady in sitting.  She is able to stand with min a x 1 and transfer to commode to void/  O2 increased to 5 lpm for mobility as she did demo increasing SOB/anxiety with movement.  Returned to bed and sats 85%.  Time to recover and increased back to 90% and lowered to 3 lpm pr RN discussion.  Pt is not meeting or progressing towards goals.  Family has voiced concern over returning home and needing rehab.  Given extended hospital stay, continued challenges with O2 sats  and declining mobility, SNF is appropriate at this time.     Recommendations for follow up therapy are one component of a multi-disciplinary discharge planning process, led by the attending physician.  Recommendations may be updated based on patient status, additional functional criteria and insurance authorization.  Follow Up Recommendations  Skilled nursing-short term rehab (<3 hours/day)     Assistance Recommended at Discharge Frequent or constant Supervision/Assistance  Patient can return home with the following A little help with walking and/or transfers;A little help with bathing/dressing/bathroom;Assistance with cooking/housework;Help with stairs or ramp for entrance;Direct supervision/assist for medications management   Equipment Recommendations  Rolling walker (2 wheels);BSC/3in1;Wheelchair (measurements PT);Wheelchair cushion (measurements PT);Hospital bed    Recommendations for  Other Services       Precautions / Restrictions Precautions Precautions: Fall Precaution Comments: monitor O2 Restrictions Weight Bearing Restrictions: No     Mobility  Bed Mobility Overal bed mobility: Modified Independent                  Transfers Overall transfer level: Needs assistance Equipment used: None Transfers: Bed to chair/wheelchair/BSC Sit to Stand: Min assist;Min guard Stand pivot transfers: Min assist              Ambulation/Gait                   Stairs             Wheelchair Mobility    Modified Rankin (Stroke Patients Only)       Balance Overall balance assessment: Needs assistance Sitting-balance support: Feet supported Sitting balance-Leahy Scale: Good     Standing balance support: Bilateral upper extremity supported;During functional activity;Reliant on assistive device for balance Standing balance-Leahy Scale: Fair Standing balance comment: for pivot to recliner needed min assist and at least single UE support                            Cognition Arousal/Alertness: Awake/alert Behavior During Therapy: [facility] for tasks assessed/performed;Anxious Overall Cognitive Status: History of cognitive impairments - at baseline                                 General Comments: [facility]        Exercises  General Comments        Pertinent Vitals/Pain Pain Assessment: No/denies pain    Home Living                          Prior Function            PT Goals (current goals can now be found in the care plan section) Progress towards PT goals: Not progressing toward goals - comment    Frequency           PT Plan Discharge plan needs to be updated    Co-evaluation              AM-PAC PT "6 Clicks" Mobility   Outcome Measure  Help needed turning from your back to your side while in a flat bed without using bedrails?: None Help needed moving from lying on your  back to sitting on the side of a flat bed without using bedrails?: None Help needed moving to and from a bed to a chair (including a wheelchair)?: A Little Help needed standing up from a chair using your arms (e.g., wheelchair or bedside chair)?: A Little Help needed to walk in hospital room?: A Lot Help needed climbing 3-5 steps with a railing? : A Lot 6 Click Score: 18    End of Session Equipment Utilized During Treatment: Oxygen Activity Tolerance: Treatment limited secondary to medical complications (Comment) Patient left: in bed;with call bell/phone within reach;with bed alarm set Nurse Communication: Mobility status PT Visit Diagnosis: Muscle weakness (generalized) (M62.81);Difficulty in walking, not elsewhere classified (R26.2);Unsteadiness on feet (R26.81);Pain Pain - Right/Left: Right Pain - part of body: Hip     Time: 1130-1144 PT Time Calculation (min) (ACUTE ONLY): 14 min  Charges:  $Therapeutic Activity: 8-22 mins                    Chesley Noon, PTA 07/25/21, 1:40 PM

## 2021-07-25 NOTE — Progress Notes (Signed)
Sats 95% on 5L s/p breathing treatment and use of acapella flutter valve. Will titrate oxygen back down.

## 2021-07-25 NOTE — Progress Notes (Signed)
RT called to room to draw ABG. Pt in resp distress using accessory muscles to breath. Pt on 3Lnc with sats in high 80's. Pt placed on on BiPAP 10/5 - pt did not tolerate this at all. Pt trialed on HHFNC 45L 33% FiO2. Sat mid-upper 90's. Pt has a wet congested non-productive cough. Pt resting on HHFNC at this time. RN aware

## 2021-07-25 NOTE — Progress Notes (Signed)
Patient appears anxious when taking medications.Patient noted with sats 87-88% on 3L [place]. Patient encouraged to cough and deep breath. Patient reports pain with coughing and deep breathing. Oxygen increased to 6L with no improvements. HOB elevated. Lung sound coarse. PRN tussin with hydrocodone administered. Provider Randol Kern NP made aware. New orders for respiratory medications. RT Mikki Santee made aware of the new orders. Will continue to monitor and endorse.

## 2021-07-25 NOTE — Progress Notes (Signed)
Pt noted to have mild rhonchi. Had her do several deep breaths with flutter valve followed by coughing. Cough was non-productive but rhonchi subsequently resolved.

## 2021-07-25 NOTE — Progress Notes (Signed)
PROGRESS NOTE    [name] [name]  ZOX:[id number] DOB: 11-Jul-1930 DOA: 07/20/2021 PCP: [name] [name]., MD    Assessment & Plan:   Principal Problem:   Acute hypoxemic respiratory failure due to COVID-19 [facility]) Active Problems:   Protein-calorie malnutrition, severe   Major depressive disorder, single episode, moderate ([place])   Acute hypoxic respiratory failure: secondary to COVID-19 infection/COPD exacerbation/malignancy. Worsening respiratory status. Continue on supplemental oxygen and wean as tolerated. Completed COVID19 treatment course. Continue on bronchodilators and encourage incentive spirometry. CXR shows worsening pneumonia (likely viral) and possible pulmonary edema. Started on IV lasix. Monitor I/Os. Will get ABG   Debility: PT/OT recs HH but pt's daughter disagrees w/ therapy evaluation   Left upper lobe lung mass: CT chest done on 07/15/2021 showed 3.4 x 2.9 cm spiculated mass consistent with malignancy.  Medical oncology and PCCM were consulted.  PCCM planning for bronchoscopy/PET scan as an outpatient.   PAF: continue on diltiazem. Not on anticoagulation due to advanced age. Cardio recs apprec   Hx of COPD: continue on bronchodilators and encourage incentive spirometry    Acute metabolic encephalopathy: likely exacerbated from [place]. CT head showed no acute intracranial findings    CKDII: Cr is at baseline   Hypokalemia: potassium given    HTN: continue on cardizem    Severe protein calorie malnutrition: continue on nutritional supplements     DVT prophylaxis:  lasix  Code Status: full  Family Communication: discussed pt's care w/ daughter, [name] [name], and answered her questions  Disposition Plan:  unclear   Level of care: Progressive  Status is: Inpatient  Remains inpatient appropriate because: severity of illness, worsening dyspnea    Consultants:  Cardio Palliative care  pulmon  Procedures:  Antimicrobials:    Subjective: Pt c/o shortness of  breath   Objective: Vitals:   07/25/21 0000 07/25/21 0039 07/25/21 0508 07/25/21 0758  BP:   113/79 (!) 155/44  Pulse:   70 65  Resp:   17   Temp:   98.9 F (37.2 C) 98.6 F (37 C)  TempSrc:    Oral  SpO2: (!) 88% 95% 97% 94%  Weight:      Height:        Intake/Output Summary (Last 24 hours) at 07/25/2021 0801 Last data filed at 07/25/2021 0000 Gross per 24 hour  Intake 360 ml  Output --  Net 360 ml   Filed Weights   07/20/2021 1706  Weight: 45 kg    Examination:  General exam: Appears calm but uncomfortable. Frail appearing  Respiratory system: diminished breath sounds b/l Cardiovascular system: S1 & S2+. No rubs, gallops or clicks.  Gastrointestinal system: Abdomen is nondistended, soft and nontender.  Normal bowel sounds heard. Central nervous system: Alert and awake. Moves all extremities  Psychiatry: Judgement and insight appear not at baseline. Flat mood and affect     Data Reviewed: I have personally reviewed following labs and imaging studies  CBC: Recent Labs  Lab 07/20/21 0748 07/23/21 0547  WBC 8.0 10.8*  NEUTROABS 5.5  --   HGB 12.6 11.1*  HCT 38.8 33.9*  MCV 96.3 96.3  PLT 357 811   Basic Metabolic Panel: Recent Labs  Lab 07/20/21 0748 07/22/21 0739 07/23/21 0547 07/25/21 0531  NA 141 142 142 142  K 3.8 3.6 3.6 3.1*  CL 102 105 105 105  CO2 28 31 29 27   GLUCOSE 104* 103* 109* 108*  BUN 30* 38* 38* 30*  CREATININE 0.96 1.24* 1.23* 1.10*  CALCIUM 9.1 8.7* 9.1 8.9  MG  --  1.4* 2.3  --    GFR: Estimated Creatinine Clearance: 24.6 mL/min (A) (by C-G formula based on SCr of 1.1 mg/dL (H)). Liver Function Tests: Recent Labs  Lab 07/22/21 0739  AST 18  ALT 17  ALKPHOS 48  BILITOT 0.6  PROT 5.5*  ALBUMIN 3.4*   No results for input(s): LIPASE, AMYLASE in the last 168 hours. No results for input(s): AMMONIA in the last 168 hours. Coagulation Profile: No results for input(s): INR, PROTIME in the last 168 hours. Cardiac  Enzymes: No results for input(s): CKTOTAL, CKMB, CKMBINDEX, TROPONINI in the last 168 hours. BNP (last 3 results) No results for input(s): PROBNP in the last 8760 hours. HbA1C: No results for input(s): HGBA1C in the last 72 hours. CBG: No results for input(s): GLUCAP in the last 168 hours. Lipid Profile: No results for input(s): CHOL, HDL, LDLCALC, TRIG, CHOLHDL, LDLDIRECT in the last 72 hours. Thyroid Function Tests: No results for input(s): TSH, T4TOTAL, FREET4, T3FREE, THYROIDAB in the last 72 hours. Anemia Panel: No results for input(s): VITAMINB12, FOLATE, FERRITIN, TIBC, IRON, RETICCTPCT in the last 72 hours. Sepsis Labs: No results for input(s): PROCALCITON, LATICACIDVEN in the last 168 hours.  No results found for this or any previous visit (from the past 240 hour(s)).       Radiology Studies: ECHOCARDIOGRAM COMPLETE  Result Date: 07/23/2021    ECHOCARDIOGRAM REPORT   Patient Name:   [name] [name] Date of Exam: 07/23/2021 Medical Rec #:  [id number]          Height:       63.0 in Accession #:    [id number]         Weight:       99.3 lb Date of Birth:  May 01, 1930           BSA:          1.436 m Patient Age:    52 years           BP:           126/55 mmHg Patient Gender: F                  HR:           67 bpm. Exam Location:  [facility] Procedure: 2D Echo, Color Doppler and Cardiac Doppler Indications:     I48.91 Atrial fibrillation  History:         Patient has no prior history of Echocardiogram examinations.                  COPD; Risk Factors:Hypertension.  Sonographer:     Charmayne Sheer Referring Phys:  [id number] [name] [name] Diagnosing Phys: [name] Rogue MD  Sonographer Comments: Suboptimal subcostal window. Image acquisition challenging due to patient body habitus. IMPRESSIONS  1. Left ventricular ejection fraction, by estimation, is 60 to 65%. The left ventricle has normal function. The left ventricle has no regional wall motion abnormalities. Left ventricular diastolic  parameters are consistent with Grade I diastolic dysfunction (impaired relaxation).  2. Right ventricular systolic function is normal. The right ventricular size is normal. Tricuspid regurgitation signal is inadequate for assessing PA pressure.  3. The mitral valve is normal in structure. No evidence of mitral valve regurgitation. No evidence of mitral stenosis.  4. The aortic valve was not well visualized. Aortic valve regurgitation is not visualized. No aortic stenosis is present.  5. The inferior vena cava is  normal in size with greater than 50% respiratory variability, suggesting right atrial pressure of 3 mmHg. FINDINGS  Left Ventricle: Left ventricular ejection fraction, by estimation, is 60 to 65%. The left ventricle has normal function. The left ventricle has no regional wall motion abnormalities. The left ventricular internal cavity size was normal in size. There is  no left ventricular hypertrophy. Left ventricular diastolic parameters are consistent with Grade I diastolic dysfunction (impaired relaxation). Right Ventricle: The right ventricular size is normal. No increase in right ventricular wall thickness. Right ventricular systolic function is normal. Tricuspid regurgitation signal is inadequate for assessing PA pressure. Left Atrium: Left atrial size was normal in size. Right Atrium: Right atrial size was normal in size. Pericardium: There is no evidence of pericardial effusion. Mitral Valve: The mitral valve is normal in structure. No evidence of mitral valve regurgitation. No evidence of mitral valve stenosis. MV peak gradient, 6.0 mmHg. The mean mitral valve gradient is 2.0 mmHg. Tricuspid Valve: The tricuspid valve is normal in structure. Tricuspid valve regurgitation is mild . No evidence of tricuspid stenosis. Aortic Valve: The aortic valve was not well visualized. Aortic valve regurgitation is not visualized. No aortic stenosis is present. Aortic valve mean gradient measures 6.0 mmHg. Aortic  valve peak gradient measures 12.2 mmHg. Aortic valve area, by VTI measures 2.15 cm. Pulmonic Valve: The pulmonic valve was normal in structure. Pulmonic valve regurgitation is not visualized. No evidence of pulmonic stenosis. Aorta: The aortic root is normal in size and structure. Venous: The inferior vena cava is normal in size with greater than 50% respiratory variability, suggesting right atrial pressure of 3 mmHg. IAS/Shunts: No atrial level shunt detected by color flow Doppler.  LEFT VENTRICLE PLAX 2D LVIDd:         3.33 cm   Diastology LVIDs:         1.96 cm   LV e' medial:    7.94 cm/s LV PW:         0.84 cm   LV E/e' medial:  9.5 LV IVS:        0.79 cm   LV e' lateral:   8.49 cm/s LVOT diam:     1.90 cm   LV E/e' lateral: 8.9 LV SV:         65 LV SV Index:   45 LVOT Area:     2.84 cm  RIGHT VENTRICLE RV Basal diam:  2.62 cm RV S prime:     12.60 cm/s LEFT ATRIUM             Index        RIGHT ATRIUM           Index LA diam:        2.10 cm 1.46 cm/m   RA Area:     11.00 cm LA Vol (A2C):   32.5 ml 22.63 ml/m  RA Volume:   22.30 ml  15.53 ml/m LA Vol (A4C):   33.5 ml 23.33 ml/m LA Biplane Vol: 35.4 ml 24.65 ml/m  AORTIC VALVE                     PULMONIC VALVE AV Area (Vmax):    1.98 cm      PV Vmax:       1.00 m/s AV Area (Vmean):   1.75 cm      PV Vmean:      60.300 cm/s AV Area (VTI):     2.15 cm  PV VTI:        0.170 m AV Vmax:           175.00 cm/s   PV Peak grad:  4.0 mmHg AV Vmean:          118.000 cm/s  PV Mean grad:  2.0 mmHg AV VTI:            0.304 m AV Peak Grad:      12.2 mmHg AV Mean Grad:      6.0 mmHg LVOT Vmax:         122.00 cm/s LVOT Vmean:        72.700 cm/s LVOT VTI:          0.230 m LVOT/AV VTI ratio: 0.76  AORTA Ao Root diam: 3.00 cm MITRAL VALVE MV Area (PHT): 2.53 cm     SHUNTS MV Area VTI:   1.89 cm     Systemic VTI:  0.23 m MV Peak grad:  6.0 mmHg     Systemic Diam: 1.90 cm MV Mean grad:  2.0 mmHg MV Vmax:       1.22 m/s MV Vmean:      58.8 cm/s MV Decel Time: 300  msec MV E velocity: 75.80 cm/s MV A velocity: 112.00 cm/s MV E/A ratio:  0.68 [name] Rogue MD Electronically signed by [name] Rogue MD Signature Date/Time: 07/23/2021/6:08:05 PM    Final         Scheduled Meds:  vitamin C  500 mg Oral Daily   calcium-vitamin D  2 tablet Oral Daily   cholecalciferol  1,000 Units Oral Daily   citalopram  10 mg Oral Daily   diltiazem  60 mg Oral Q6H   enoxaparin (LOVENOX) injection  30 mg Subcutaneous Q24H   famotidine  20 mg Oral Daily   feeding supplement  1 Container Oral TID BM   guaiFENesin-dextromethorphan  10 mL Oral Q6H   melatonin  2.5 mg Oral QHS   multivitamin with minerals  1 tablet Oral Daily   polyethylene glycol  17 g Oral Daily   zinc sulfate  220 mg Oral Daily   Continuous Infusions:   LOS: 14 days    Time spent: 33 mins     Wyvonnia Dusky, MD [facility] Pager [phone]  If 7PM-7AM, please contact night-coverage 07/25/2021, 8:01 AM

## 2021-07-25 NOTE — TOC Initial Note (Signed)
Transition of Care [facility]) - Initial/Assessment Note    Patient Details  Name: [name] [name] MRN: [id number] Date of Birth: 10/11/1929  Transition of Care [facility]) CM/SW Contact:    Alberteen Sam, LCSW Phone Number: 07/25/2021, 2:54 PM  Clinical Narrative:                  CSW has lvm with patient's daughter [name] regarding PT recommendation change from [facility] to now SNF.   Pending call back from [facility] for any facility preference and Ann's permission to fax out for referrals.    Expected Discharge Plan: Skilled Nursing Facility Barriers to Discharge: Continued Medical Work up   Patient Goals and CMS Choice Patient states their goals for this hospitalization and ongoing recovery are:: to go home CMS Medicare.gov Compare Post Acute Care list provided to:: Patient Represenative (must comment) (daughter [name]) Choice offered to / list presented to : Adult Children  Expected Discharge Plan and Services Expected Discharge Plan: [place]   Discharge Planning Services: CM Consult Post Acute Care Choice: [place] Living arrangements for the past 2 months: Single Family Home                           HH Arranged: RN, PT [facility] Agency: [place] ([facility]) Date HH Agency Contacted: 07/13/21   Representative spoke with at [place]: Corene Cornea  Prior Living Arrangements/Services Living arrangements for the past 2 months: [place] Lives with:: Self Patient language and need for interpreter reviewed:: Yes Do you feel safe going back to the place where you live?: Yes      Need for Family Participation in Patient Care: Yes (Comment) Care giver support system in place?: Yes (comment) Current home services: DME Criminal Activity/Legal Involvement Pertinent to Current Situation/Hospitalization: No - Comment as needed  Activities of Daily Living Home Assistive Devices/Equipment: Walker (specify type), Reacher, Bedside commode/3-in-1 ADL Screening  (condition at time of admission) Patient's cognitive ability adequate to safely complete daily activities?: Yes Is the patient deaf or have difficulty hearing?: Yes Does the patient have difficulty seeing, even when wearing glasses/contacts?: No Does the patient have difficulty concentrating, remembering, or making decisions?: No Patient able to express need for assistance with ADLs?: Yes Does the patient have difficulty dressing or bathing?: No Independently performs ADLs?: Yes (appropriate for developmental age) Does the patient have difficulty walking or climbing stairs?: Yes Weakness of Legs: None Weakness of Arms/Hands: None  Permission Sought/Granted                  Emotional Assessment       Orientation: : Oriented to Self, Oriented to Place, Oriented to  Time, Oriented to Situation Alcohol / Substance Use: Not Applicable Psych Involvement: No (comment)  Admission diagnosis:  SOB (shortness of breath) [R06.02] Dyspnea, unspecified type [R06.00] Community acquired pneumonia, unspecified laterality [J18.9] Acute hypoxemic respiratory failure due to COVID-19 ([place]) [U07.1, J96.01] COVID [U07.1] Patient Active Problem List   Diagnosis Date Noted   Major depressive disorder, single episode, moderate ([place]) 07/22/2021   Protein-calorie malnutrition, severe 07/13/2021   Acute hypoxemic respiratory failure due to COVID-19 ([place]) 08/06/2021   Pulmonary emphysema ([place]) 11/16/2015   Chronic obstructive pulmonary emphysema ([place]) 12/24/2014   Allergic rhinitis 12/23/2014   Back pain, chronic 12/23/2014   Barrett's esophagus with low grade dysplasia 12/23/2014   Aneurysm, cerebral 12/23/2014   CAFL (chronic airflow limitation) ([place]) 12/23/2014   Cognitive decline  12/23/2014   Essential (primary) hypertension 12/23/2014   Fatigue 12/23/2014   Acid reflux 12/23/2014   H/O peptic ulcer 12/23/2014   History of biliary disease 12/23/2014   HLD (hyperlipidemia) 12/23/2014    Insomnia related to another mental disorder 12/23/2014   Arthritis, degenerative 12/23/2014   OP (osteoporosis) 12/23/2014   Plantar fasciitis 12/23/2014   Hypercholesterolemia without hypertriglyceridemia 12/23/2014   Peptic ulcer 12/23/2014   Kidney cysts 12/23/2014   Fast heart beat 12/23/2014   Tubular adenoma 12/23/2014   Vertebral compression fracture ([place]) 12/23/2014   Avitaminosis D 12/23/2014   A-fib ([place]) 04/08/2014   Hypertension 08/06/2007   EMPHYSEMA 08/06/2007   ASTHMA 08/06/2007   COPD (chronic obstructive pulmonary disease) ([place]) 08/06/2007   PCP:  Jerrol Banana., MD Pharmacy:   [place], [place] - [address] [place] [address] Phone: [phone] Fax: [phone]  [place], [place] - [address] [place] [address] Phone: [phone] Fax: [phone]     Social Determinants of Health (SDOH) Interventions    Readmission Risk Interventions No flowsheet data found.

## 2021-07-25 NOTE — Progress Notes (Signed)
Patient maintained sats 95-97% the rest of the night with 2L O2 via [place]. Stated the pain improved. Safety measures maintained, Bed in lowest position, call bell within reach. Fluids encouraged.

## 2021-07-25 NOTE — NC FL2 (Signed)
[place] LEVEL OF CARE SCREENING TOOL     IDENTIFICATION  Patient Name: [name] [name] Birthdate: 01-23-1930 Sex: female Admission Date (Current Location): 07/31/2021  [facility] and [place] Number:  Engineering geologist and Address:  [facility], [address] [place], [place] [address]      Provider Number: [id number]  Attending Physician Name and Address:  Wyvonnia Dusky, MD  Relative Name and Phone Number:  [name] (daughter) [phone]    Current Level of Care: Hospital Recommended Level of Care: [place] Prior Approval Number:    Date Approved/Denied:   PASRR Number: [id number]  Discharge Plan: SNF    Current Diagnoses: Patient Active Problem List   Diagnosis Date Noted   Major depressive disorder, single episode, moderate ([place]) 07/22/2021   Protein-calorie malnutrition, severe 07/13/2021   Acute hypoxemic respiratory failure due to COVID-19 ([place]) 07/14/2021   Pulmonary emphysema ([place]) 11/16/2015   Chronic obstructive pulmonary emphysema ([place]) 12/24/2014   Allergic rhinitis 12/23/2014   Back pain, chronic 12/23/2014   Barrett's esophagus with low grade dysplasia 12/23/2014   Aneurysm, cerebral 12/23/2014   CAFL (chronic airflow limitation) ([facility]) 12/23/2014   Cognitive decline 12/23/2014   Essential (primary) hypertension 12/23/2014   Fatigue 12/23/2014   Acid reflux 12/23/2014   H/O peptic ulcer 12/23/2014   History of biliary disease 12/23/2014   HLD (hyperlipidemia) 12/23/2014   Insomnia related to another mental disorder 12/23/2014   Arthritis, degenerative 12/23/2014   OP (osteoporosis) 12/23/2014   Plantar fasciitis 12/23/2014   Hypercholesterolemia without hypertriglyceridemia 12/23/2014   Peptic ulcer 12/23/2014   Kidney cysts 12/23/2014   Fast heart beat 12/23/2014   Tubular adenoma 12/23/2014   Vertebral compression fracture ([place]) 12/23/2014   Avitaminosis D 12/23/2014   A-fib ([place])  04/08/2014   Hypertension 08/06/2007   EMPHYSEMA 08/06/2007   ASTHMA 08/06/2007   COPD (chronic obstructive pulmonary disease) ([place]) 08/06/2007    Orientation RESPIRATION BLADDER Height & Weight     Self, Place, Situation  O2 (3L O2) Continent Weight: 99 lb 4.8 oz (45 kg) Height:  5\' 3"  (160 cm)  BEHAVIORAL SYMPTOMS/MOOD NEUROLOGICAL BOWEL NUTRITION STATUS      Continent Diet (see discharge summary)  AMBULATORY STATUS COMMUNICATION OF NEEDS Skin   Extensive Assist Verbally Other (Comment), Skin abrasions (abrasion right elbow)                       Personal Care Assistance Level of Assistance  Bathing, Feeding, Dressing, Total care Bathing Assistance: Limited assistance Feeding assistance: Limited assistance Dressing Assistance: Limited assistance Total Care Assistance: Maximum assistance   Functional Limitations Info  Sight, Hearing, Speech Sight Info: Adequate Hearing Info: Impaired Speech Info: Adequate    SPECIAL CARE FACTORS FREQUENCY  PT (By licensed PT), OT (By licensed OT)     PT Frequency: min 4x weekly OT Frequency: min 4x weekly            Contractures Contractures Info: Not present    Additional Factors Info  Code Status, Allergies Code Status Info: DNR Allergies Info: Ciprofloxacin   Macrobid  (Nitrofurantoin)   Metoprolol   Sulfa Antibiotics   Sulfonamide Derivatives           Current Medications (07/25/2021):  This is the current hospital active medication list Current Facility-Administered Medications  Medication Dose Route Frequency Provider Last Rate Last Admin   acetaminophen (TYLENOL) tablet 650 mg  650 mg Oral Q6H PRN Mansy, Arvella Merles, MD  650 mg at 07/21/21 2105   ascorbic acid (VITAMIN C) tablet 500 mg  500 mg Oral Daily Mansy, Jan A, MD   500 mg at 07/25/21 2355   benzonatate (TESSALON) capsule 200 mg  200 mg Oral TID Wyvonnia Dusky, MD   200 mg at 07/25/21 1419   calcium-vitamin D (OSCAL WITH D) 500-5 MG-MCG per tablet 2 tablet   2 tablet Oral Daily Mansy, Jan A, MD   2 tablet at 07/25/21 0912   chlorpheniramine-HYDROcodone ([place]) 10-8 MG/5ML suspension 5 mL  5 mL Oral Q12H PRN Mansy, Jan A, MD   5 mL at 07/24/21 2349   cholecalciferol (VITAMIN D3) tablet 1,000 Units  1,000 Units Oral Daily Mansy, Jan A, MD   1,000 Units at 07/25/21 0912   citalopram (CELEXA) tablet 10 mg  10 mg Oral Daily Imagene Sheller S, DO   10 mg at 07/25/21 0912   diltiazem (CARDIZEM) tablet 60 mg  60 mg Oral Q6H Theora Gianotti, NP   60 mg at 07/25/21 1419   enoxaparin (LOVENOX) injection 30 mg  30 mg Subcutaneous Q24H Vira Blanco, RPH   30 mg at 07/24/21 2030   famotidine (PEPCID) tablet 20 mg  20 mg Oral Daily Darrick Penna, RPH   20 mg at 07/25/21 7322   feeding supplement (BOOST / RESOURCE BREEZE) liquid 1 Container  1 Container Oral TID BM Mansy, Arvella Merles, MD   1 Container at 07/24/21 2029   furosemide (LASIX) injection 40 mg  40 mg Intravenous BID Wyvonnia Dusky, MD       guaiFENesin-dextromethorphan (ROBITUSSIN DM) 100-10 MG/5ML syrup 10 mL  10 mL Oral Q6H Adhikari, Amrit, MD   10 mL at 07/25/21 1418   HYDROcodone-acetaminophen (NORCO/VICODIN) 5-325 MG per tablet 1 tablet  1 tablet Oral Q6H PRN Imagene Sheller S, DO   1 tablet at 07/23/21 1813   ipratropium-albuterol (DUONEB) 0.5-2.5 (3) MG/3ML nebulizer solution 3 mL  3 mL Nebulization Q4H PRN Sharion Settler, NP   3 mL at 07/25/21 0039   magnesium hydroxide (MILK OF MAGNESIA) suspension 30 mL  30 mL Oral Daily PRN Mansy, Jan A, MD       melatonin tablet 2.5 mg  2.5 mg Oral QHS Hall, Carole N, DO   2.5 mg at 07/24/21 2029   multivitamin with minerals tablet 1 tablet  1 tablet Oral Daily Mansy, Jan A, MD   1 tablet at 07/25/21 0911   ondansetron (ZOFRAN) tablet 4 mg  4 mg Oral Q6H PRN Mansy, Jan A, MD   4 mg at 07/16/21 1010   Or   ondansetron (ZOFRAN) injection 4 mg  4 mg Intravenous Q6H PRN Mansy, Jan A, MD       polyethylene glycol (MIRALAX / GLYCOLAX) packet 17 g  17  g Oral Daily Gwynne Edinger, MD   17 g at 07/25/21 0912   zinc sulfate capsule 220 mg  220 mg Oral Daily Mansy, Jan A, MD   220 mg at 07/25/21 0911     Discharge Medications: Please see discharge summary for a list of discharge medications.  Relevant Imaging Results:  Relevant Lab Results:   Additional Information GUR:[id number]  Alberteen Sam, LCSW

## 2021-07-26 ENCOUNTER — Ambulatory Visit: Payer: Self-pay

## 2021-07-26 DIAGNOSIS — R627 Adult failure to thrive: Secondary | ICD-10-CM

## 2021-07-26 DIAGNOSIS — U071 COVID-19: Secondary | ICD-10-CM | POA: Diagnosis not present

## 2021-07-26 DIAGNOSIS — E870 Hyperosmolality and hypernatremia: Secondary | ICD-10-CM

## 2021-07-26 DIAGNOSIS — J9601 Acute respiratory failure with hypoxia: Secondary | ICD-10-CM | POA: Diagnosis not present

## 2021-07-26 LAB — BASIC METABOLIC PANEL
Anion gap: 12 (ref 5–15)
BUN: 32 mg/dL — ABNORMAL HIGH (ref 8–23)
CO2: 26 mmol/L (ref 22–32)
Calcium: 9.1 mg/dL (ref 8.9–10.3)
Chloride: 108 mmol/L (ref 98–111)
Creatinine, Ser: 1.36 mg/dL — ABNORMAL HIGH (ref 0.44–1.00)
GFR, Estimated: 37 mL/min — ABNORMAL LOW (ref >60)
Glucose, Bld: 106 mg/dL — ABNORMAL HIGH (ref 70–99)
Potassium: 3.4 mmol/L — ABNORMAL LOW (ref 3.5–5.1)
Sodium: 146 mmol/L — ABNORMAL HIGH (ref 135–145)

## 2021-07-26 LAB — CBC
HCT: 35.2 % — ABNORMAL LOW (ref 36.0–46.0)
Hemoglobin: 11.6 g/dL — ABNORMAL LOW (ref 12.0–15.0)
MCH: 31.1 pg (ref 26.0–34.0)
MCHC: 33 g/dL (ref 30.0–36.0)
MCV: 94.4 fL (ref 80.0–100.0)
Platelets: 149 10*3/uL — ABNORMAL LOW (ref 150–400)
RBC: 3.73 MIL/uL — ABNORMAL LOW (ref 3.87–5.11)
RDW: 13.7 % (ref 11.5–15.5)
WBC: 14.1 10*3/uL — ABNORMAL HIGH (ref 4.0–10.5)
nRBC: 0 % (ref 0.0–0.2)

## 2021-07-26 MED ORDER — DILTIAZEM HCL 25 MG/5ML IV SOLN
5.0000 mg | Freq: Once | INTRAVENOUS | Status: AC
  Administered 2021-07-26: 5 mg via INTRAVENOUS
  Filled 2021-07-26: qty 5

## 2021-07-26 MED ORDER — BLISTEX MEDICATED EX OINT
TOPICAL_OINTMENT | CUTANEOUS | Status: DC | PRN
  Filled 2021-07-26: qty 6.3

## 2021-07-26 MED ORDER — MORPHINE SULFATE (PF) 2 MG/ML IV SOLN
1.0000 mg | INTRAVENOUS | Status: DC | PRN
  Administered 2021-07-26 – 2021-08-01 (×17): 1 mg via INTRAVENOUS
  Filled 2021-07-26 (×17): qty 1

## 2021-07-26 MED ORDER — HALOPERIDOL LACTATE 5 MG/ML IJ SOLN
5.0000 mg | Freq: Once | INTRAMUSCULAR | Status: AC
  Administered 2021-07-26: 5 mg via INTRAVENOUS
  Filled 2021-07-26: qty 1

## 2021-07-26 MED ORDER — DIPHENHYDRAMINE HCL 50 MG/ML IJ SOLN
12.5000 mg | Freq: Once | INTRAMUSCULAR | Status: AC | PRN
  Administered 2021-07-26: 12.5 mg via INTRAVENOUS
  Filled 2021-07-26: qty 1

## 2021-07-26 MED ORDER — METOPROLOL TARTRATE 5 MG/5ML IV SOLN
5.0000 mg | Freq: Four times a day (QID) | INTRAVENOUS | Status: DC | PRN
  Administered 2021-07-26 – 2021-08-01 (×5): 5 mg via INTRAVENOUS
  Filled 2021-07-26 (×6): qty 5

## 2021-07-26 MED ORDER — LORAZEPAM 2 MG/ML IJ SOLN
0.5000 mg | Freq: Once | INTRAMUSCULAR | Status: AC
  Administered 2021-07-26: 0.5 mg via INTRAVENOUS
  Filled 2021-07-26: qty 1

## 2021-07-26 MED ORDER — LEVALBUTEROL HCL 0.63 MG/3ML IN NEBU
0.6300 mg | INHALATION_SOLUTION | Freq: Once | RESPIRATORY_TRACT | Status: AC
  Administered 2021-07-26: 0.63 mg via RESPIRATORY_TRACT
  Filled 2021-07-26: qty 3

## 2021-07-26 MED ORDER — MORPHINE SULFATE (PF) 2 MG/ML IV SOLN
INTRAVENOUS | Status: AC
  Administered 2021-07-26: 1 mg via INTRAVENOUS
  Filled 2021-07-26: qty 1

## 2021-07-26 MED ORDER — LABETALOL HCL 5 MG/ML IV SOLN: 5.0000 mg | Freq: Once | INTRAVENOUS | Status: DC

## 2021-07-26 MED ORDER — ACETAMINOPHEN 650 MG RE SUPP: 650.0000 mg | Freq: Four times a day (QID) | RECTAL | Status: DC | PRN

## 2021-07-26 NOTE — Progress Notes (Signed)
OT Cancellation Note  Patient Details Name: [name] [name] MRN: [id number] DOB: 1930/02/08   Cancelled Treatment:    Reason Eval/Treat Not Completed: Medical issues which prohibited therapy. Chart reviewed. Pt is currently lethargic and on HFNC O2 support. Family is meeting w/ Palliative Care this morning re: pt's overall status including comfort care status, per NSG. Will hold OT services and follow up as appropriate after the family meeting. RN notified.   Ardeth Perfect., MPH, MS, OTR/L ascom [phone] 07/26/21, 10:19 AM

## 2021-07-26 NOTE — Progress Notes (Addendum)
PROGRESS NOTE    [name] [name]  GQQ:[id number] DOB: [birth year] DOA: 08/07/2021 PCP: Jerrol Banana., MD    Assessment & Plan:   Principal Problem:   Acute hypoxemic respiratory failure due to COVID-19 [facility]) Active Problems:   Protein-calorie malnutrition, severe   Major depressive disorder, single episode, moderate (HCC)   Failure to thrive: secondary to all below. Discussed comfort care w/ pt's daughter this morning. Family meeting w/ palliative care today. NPO currently   Acute hypoxic respiratory failure: secondary to COVID-19 infection/COPD exacerbation/malignancy. Poor respiratory status, likely worse from day prior. Completed COVID19 treatment course. Continue on bronchodilators and encourage incentive spirometry. CXR shows worsening pneumonia (likely viral) and possible acute pulmonary edema. Continue on IV lasix. ABG shows pO2 < 31, tried on BiPAP but pt could not tolerate so pt placed on HFNC yesterday afternoon. High risk for further deterioration. Poor prognosis.    Debility: PT/OT recs HH but pt's daughter disagrees w/ therapy evaluation   Left upper lobe lung mass: CT chest done on 07/15/2021 showed 3.4 x 2.9 cm spiculated mass consistent with malignancy.  Medical oncology and PCCM were consulted.  PCCM planning for bronchoscopy/PET scan as an outpatient.  Hypernatremia: free water deficit is 0.9L. Unable to give D5W secondary to significant concern for acute pulmonary edema    PAF: continue on diltiazem. Not on anticoagulation due to advanced age. Cardio recs apprec  Thrombocytopenia: etiology unclear. Will continue to monitor    Hx of COPD: continue on bronchodilators and encourage incentive spirometry    Acute metabolic encephalopathy: likely exacerbated from [place]. CT head showed no acute intracranial findings. Worsening mental status today    CKDII: Cr is trending up today   Hypokalemia: potassium ordered     HTN: continue on cardizem    Severe  protein calorie malnutrition: continue on nutritional supplements     DVT prophylaxis:  lovenox  Code Status: full  Family Communication: discussed pt's care w/ daughter, [name], and answered her questions  Disposition Plan:  unclear   Level of care: Progressive  Status is: Inpatient  Remains inpatient appropriate because: severity of illness, worsening dyspnea. High risk for further deterioration. Poor prognosis   Consultants:  Cardio Palliative care  pulmon  Procedures:  Antimicrobials:    Subjective: Pt is very lethargic and unable to answer questions appropriately   Objective: Vitals:   07/26/21 0300 07/26/21 0400 07/26/21 0425 07/26/21 0524  BP: 121/61 (!) 135/59 127/64   Pulse:    85  Resp: (!) 24 (!) 25 (!) 27   Temp:    98.2 F (36.8 C)  TempSrc:    Oral  SpO2:    92%  Weight:      Height:        Intake/Output Summary (Last 24 hours) at 07/26/2021 0752 Last data filed at 07/25/2021 1800 Gross per 24 hour  Intake 120 ml  Output 225 ml  Net -105 ml   Filed Weights   07/16/2021 1706  Weight: 45 kg    Examination:  General exam: Appears uncomfortable and lethargic  Respiratory system: course breath sounds b/l. Accessory muscle use  Cardiovascular system: S1/S2+. No rubs or gallops  Gastrointestinal system: Abd is soft, NT, ND & hypoactive bowel sounds Central nervous system: Lethargic. Does not follow simple commands Psychiatry: judgement and insight appears not at baseline.     Data Reviewed: I have personally reviewed following labs and imaging studies  CBC: Recent Labs  Lab 07/20/21 0748 07/23/21 0547 07/26/21 6712  WBC 8.0 10.8* 14.1*  NEUTROABS 5.5  --   --   HGB 12.6 11.1* 11.6*  HCT 38.8 33.9* 35.2*  MCV 96.3 96.3 94.4  PLT 357 276 245*   Basic Metabolic Panel: Recent Labs  Lab 07/20/21 0748 07/22/21 0739 07/23/21 0547 07/25/21 0531 07/26/21 0552  NA 141 142 142 142 146*  K 3.8 3.6 3.6 3.1* 3.4*  CL 102 105 105 105 108   CO2 28 31 29 27 26   GLUCOSE 104* 103* 109* 108* 106*  BUN 30* 38* 38* 30* 32*  CREATININE 0.96 1.24* 1.23* 1.10* 1.36*  CALCIUM 9.1 8.7* 9.1 8.9 9.1  MG  --  1.4* 2.3  --   --    GFR: Estimated Creatinine Clearance: 19.9 mL/min (A) (by C-G formula based on SCr of 1.36 mg/dL (H)). Liver Function Tests: Recent Labs  Lab 07/22/21 0739  AST 18  ALT 17  ALKPHOS 48  BILITOT 0.6  PROT 5.5*  ALBUMIN 3.4*   No results for input(s): LIPASE, AMYLASE in the last 168 hours. No results for input(s): AMMONIA in the last 168 hours. Coagulation Profile: No results for input(s): INR, PROTIME in the last 168 hours. Cardiac Enzymes: No results for input(s): CKTOTAL, CKMB, CKMBINDEX, TROPONINI in the last 168 hours. BNP (last 3 results) No results for input(s): PROBNP in the last 8760 hours. HbA1C: No results for input(s): HGBA1C in the last 72 hours. CBG: No results for input(s): GLUCAP in the last 168 hours. Lipid Profile: No results for input(s): CHOL, HDL, LDLCALC, TRIG, CHOLHDL, LDLDIRECT in the last 72 hours. Thyroid Function Tests: No results for input(s): TSH, T4TOTAL, FREET4, T3FREE, THYROIDAB in the last 72 hours. Anemia Panel: No results for input(s): VITAMINB12, FOLATE, FERRITIN, TIBC, IRON, RETICCTPCT in the last 72 hours. Sepsis Labs: No results for input(s): PROCALCITON, LATICACIDVEN in the last 168 hours.  No results found for this or any previous visit (from the past 240 hour(s)).       Radiology Studies: DG Chest Port 1 View  Result Date: 07/25/2021 CLINICAL DATA:  Difficulty breathing EXAM: PORTABLE CHEST 1 VIEW COMPARISON:  Previous studies including chest radiograph done on 07/20/2021 FINDINGS: There is marked elevation of left hemidiaphragm suggesting eventration or paralysis. Transverse diameter of heart is slightly increased. Central pulmonary vessels are more prominent. Increased interstitial and alveolar markings are seen in the right parahilar region and  right lower lung fields. Increased markings are seen in the left lower lung fields. Lateral CP angles are indistinct. There is no pneumothorax. IMPRESSION: There is interval increase in interstitial and alveolar markings in the right parahilar region and right lower lung fields suggesting pneumonia and possibly asymmetric pulmonary edema. Possible small bilateral pleural effusions. Subsegmental atelectasis is seen in the left lower lung fields. Electronically Signed   By: Elmer Picker M.D.   On: 07/25/2021 10:40        Scheduled Meds:  vitamin C  500 mg Oral Daily   benzonatate  200 mg Oral TID   calcium-vitamin D  2 tablet Oral Daily   cholecalciferol  1,000 Units Oral Daily   citalopram  10 mg Oral Daily   diltiazem  60 mg Oral Q6H   enoxaparin (LOVENOX) injection  30 mg Subcutaneous Q24H   famotidine  20 mg Oral Daily   feeding supplement  1 Container Oral TID BM   furosemide  40 mg Intravenous BID   guaiFENesin-dextromethorphan  10 mL Oral Q6H   melatonin  2.5 mg Oral QHS  multivitamin with minerals  1 tablet Oral Daily   polyethylene glycol  17 g Oral Daily   zinc sulfate  220 mg Oral Daily   Continuous Infusions:   LOS: 15 days    Time spent: 33 mins     Wyvonnia Dusky, MD [facility] Pager [phone]  If 7PM-7AM, please contact night-coverage 07/26/2021, 7:52 AM

## 2021-07-26 NOTE — Progress Notes (Signed)
PT Cancellation Note  Patient Details Name: [name] [name] MRN: [id number] DOB: Feb 24, 1930   Cancelled Treatment:    Reason Eval/Treat Not Completed: Fatigue/lethargy limiting ability to participate;Medical issues which prohibited therapy   Chesley Noon 07/26/2021, 1:20 PM

## 2021-07-26 NOTE — Progress Notes (Signed)
SLP Cancellation Note  Patient Details Name: [name] [name] MRN: [id number] DOB: 02-28-1930   Cancelled treatment:       Reason Eval/Treat Not Completed: Patient not medically ready;Medical issues which prohibited therapy (received order, reviewed chart notes.) Pt is currently lethargic and on HFNC O2 support. Family is meeting w/ Palliative Care this morning re: pt's overall status including comfort care status, per NSG. ST services will hold on assessment at this time until after the Family meeting. NSG agreed. ST will continue to monitor for any needs.     Orinda Kenner, MS, CCC-SLP Speech Language Pathologist Rehab Services [phone] [facility] 07/26/2021, 9:43 AM

## 2021-07-26 NOTE — Telephone Encounter (Signed)
°  Pt is in hospital and pt's son wanted to let her PCP know. Also added that he would like to know if there is anything else that he could advise.  Advised pt's son that he needs to make sure he voices his concerns to her hospital provider.  Please review and notify son with any suggestions       Reason for Disposition  [1] Caller requesting NON-URGENT health information AND [2] PCP's office is the best resource  Answer Assessment - Initial Assessment Questions 1. REASON FOR CALL or QUESTION: "What is your reason for calling today?" or "How can I best help you?" or "What question do you have that I can help answer?"     To inform pt's PCP of pt status and any recommendations he could offer.  Protocols used: Information Only Call - No Triage-A-AH

## 2021-07-26 NOTE — Progress Notes (Signed)
Patient too confused and lethargic to safely take oral medications today. Dr. Ree Kida.

## 2021-07-27 ENCOUNTER — Inpatient Hospital Stay: Payer: Medicare Other

## 2021-07-27 DIAGNOSIS — Z66 Do not resuscitate: Secondary | ICD-10-CM

## 2021-07-27 LAB — BASIC METABOLIC PANEL
Anion gap: 14 (ref 5–15)
BUN: 53 mg/dL — ABNORMAL HIGH (ref 8–23)
CO2: 28 mmol/L (ref 22–32)
Calcium: 8.8 mg/dL — ABNORMAL LOW (ref 8.9–10.3)
Chloride: 105 mmol/L (ref 98–111)
Creatinine, Ser: 2.12 mg/dL — ABNORMAL HIGH (ref 0.44–1.00)
GFR, Estimated: 22 mL/min — ABNORMAL LOW (ref >60)
Glucose, Bld: 98 mg/dL (ref 70–99)
Potassium: 3.2 mmol/L — ABNORMAL LOW (ref 3.5–5.1)
Sodium: 147 mmol/L — ABNORMAL HIGH (ref 135–145)

## 2021-07-27 LAB — CBC
HCT: 34.1 % — ABNORMAL LOW (ref 36.0–46.0)
Hemoglobin: 11.1 g/dL — ABNORMAL LOW (ref 12.0–15.0)
MCH: 30.7 pg (ref 26.0–34.0)
MCHC: 32.6 g/dL (ref 30.0–36.0)
MCV: 94.5 fL (ref 80.0–100.0)
Platelets: 144 10*3/uL — ABNORMAL LOW (ref 150–400)
RBC: 3.61 MIL/uL — ABNORMAL LOW (ref 3.87–5.11)
RDW: 13.7 % (ref 11.5–15.5)
WBC: 9.5 10*3/uL (ref 4.0–10.5)
nRBC: 0 % (ref 0.0–0.2)

## 2021-07-27 MED ORDER — HEPARIN SODIUM (PORCINE) 5000 UNIT/ML IJ SOLN
5000.0000 [IU] | Freq: Three times a day (TID) | INTRAMUSCULAR | Status: DC
  Administered 2021-07-28 – 2021-08-01 (×14): 5000 [IU] via SUBCUTANEOUS
  Filled 2021-07-27 (×14): qty 1

## 2021-07-27 MED ORDER — LORAZEPAM 2 MG/ML IJ SOLN
1.0000 mg | Freq: Three times a day (TID) | INTRAMUSCULAR | Status: DC | PRN
  Administered 2021-07-27 – 2021-07-29 (×4): 1 mg via INTRAVENOUS
  Filled 2021-07-27 (×4): qty 1

## 2021-07-27 MED ORDER — DILTIAZEM HCL 25 MG/5ML IV SOLN
5.0000 mg | Freq: Once | INTRAVENOUS | Status: AC
  Administered 2021-07-27: 5 mg via INTRAVENOUS
  Filled 2021-07-27: qty 5

## 2021-07-27 MED ORDER — POTASSIUM CHLORIDE 10 MEQ/100ML IV SOLN
10.0000 meq | INTRAVENOUS | Status: AC
  Administered 2021-07-27 (×2): 10 meq via INTRAVENOUS
  Filled 2021-07-27 (×2): qty 100

## 2021-07-27 NOTE — Progress Notes (Signed)
Palliative Care Progress Note, Assessment & Plan   Patient Name: [name] [name]       Date: 07/27/2021 DOB: 02-Jun-1930  Age: 88 y.o. MRN#: [id number] Attending Physician: Wyvonnia Dusky, MD Primary Care Physician: Jerrol Banana., MD Admit Date: 07/25/2021  Reason for Consultation/Follow-up: Establishing goals of care  Subjective: Patient is lying in bed in no apparent distress with nasal cannula in place.  She acknowledges my presence.  She is able to make her wishes known.  Daughter and is at bedside.  HPI: 88 y.o. female  with past medical history of COPD, asthma, HTN, paroxysmal A. fib admitted on 08/01/2021 with worsening dyspnea, poor oral intake, cough and confusion.  Patient found to be COVID-positive.  Family endorses first positive home test occurred 07/25/2021.  1/5, CT of chest revealed a spiculated mass in the left upper lobe.  1/5, ultrasound of lower extremities revealed no evidence of a DVT.  1/6, CT of head revealed no intracranial abnormalities.   1/9, Afib with RVR - cardizem drip.   1/10 Covid precautions discontinued. Pt still requiring oxygen and waxing/waning participation with PT/OT.  Plan of Care: I have reviewed medical records including EPIC notes, labs and imaging, assessed the patient and then met with patient and her daughter [name] [name] at bedside to discuss diagnosis prognosis, GOC, EOL wishes, disposition, and options.  Discussed with patient/family the importance of continued conversation with family and the medical providers regarding overall plan of care and treatment options, ensuring decisions are within the context of the patients values and GOCs. [name] [name] shared concerns of receiving mixed messages regarding her mother's health. SHe has be given contact info and  has been in touch with patient experience to voice these concerns.  Medical update given.  Discussed patient's nutritional, cognitive, and functional decline over the last week.  [name] [name] appreciates that her mother seems to be declining rather than improving.  She would like to move towards comfort but does not want to make any adjustments until her brother is in agreement.  Therapeutic silence and active listening provided for [name] [name] to share her thoughts and emotions regarding her mother's current health status.  Anncontinues to reference that she is getting mixed messages and does not have a clear understanding of why the patient developed pneumonia.    I outlined that COVID pneumonia can linger beyond the 10 quarantine days.  Also discussed that long COVID includes cognitive and respiratory issues beyond the 10-day quarantine time.  Educated daughter that patient received full COVID-19 medical interventions to appropriately address respiratory status.  Hospice services outpatient were explained. [name] [name] shares that she did not want to make any changes to her mother's plan of care.  She would like for the chest x-ray to be performed today so that it can be compared to previous x-rays to better outline if her mother is improving or not.  [name] [name] shared she is rational and wants to come to a happy place of compromise with her brother.  I offered to speak with patient's son if and when appropriate.  I am familiar with him as we spoke in person last week.  Questions and concerns were addressed. The family was encouraged to call  with questions or concerns.   Palliative medicine team will continue to follow the patient throughout her hospitalization.  Care plan was discussed with patient, patient's daughter [name] [name], [place] therapist  Code Status: DNR  Prognosis:  Unable to determine  Discharge Planning: To Be Determined  Recommendations/Plan: Time for outcomes Continue GOC discussion with patient's daughter and  son - rehab vs comfort  Physical Exam Vitals and nursing note reviewed.  Constitutional:      Appearance: She is ill-appearing.  HENT:     Head: Normocephalic.  Cardiovascular:     Rate and Rhythm: Normal rate.  Pulmonary:     Effort: Pulmonary effort is normal.  Abdominal:     Palpations: Abdomen is soft.  Musculoskeletal:     Comments: Generalized weakness  Skin:    General: Skin is warm and dry.  Neurological:     Mental Status: She is alert.     Comments: Non linear remarks, oriented to self  Psychiatric:        Mood and Affect: Mood is not anxious.        Behavior: Behavior is not agitated.                Palliative Assessment/Data: 40%       Total Time 25 minutes  Greater than 50%  of this time was spent counseling and coordinating care related to the above assessment and plan.  Thank you for allowing the Palliative Medicine Team to assist in the care of this patient.  Altha Ilsa Iha, FNP-BC Palliative Medicine Team Team Phone # [phone]

## 2021-07-27 NOTE — Progress Notes (Addendum)
PROGRESS NOTE   HPI was taken from Dr. Sidney Ace: [name] [name] is a 88 y.o. Caucasian female with medical history significant for asthma/COPD and hypertension, who presented to the ER with acute onset of worsening dyspnea over the last 3 days.  The patient had a positive COVID-19 test on Thursday.  She has not been eating or drinking for the last few days.  She was noted to have mild confusion in the ER.  No loss of taste or smell.  She did have nausea, vomiting and diarrhea per her daughter today.  No chest pain or palpitations.  Per EMS her pulse oximetry was 81% on room air.  She was placed on 10 L with nonrebreather and pulse oximetry was in the mid 90s.  She was also given 1 DuoNeb and 1 albuterol nebulizer on route to the hospital.  She admitted to dry cough without expectoration.  She denies any nausea or vomiting however admits to lower abdominal pain.  No chest pain or palpitations.     ED Course: Upon presentation to the emergency room respiratory rate was 34 and pulse oximetry 87% on room air with a pulse oximetry of 144/59.  Labs revealed a VBG with pH 7.4 and HCO3 31.  BMP was remarkable for hypokalemia hyponatremia and hypochloremia and CBC showed leukopenia of 2.1 and thrombocytopenia with otherwise normal levels.  Influenza antigens came back negative.  COVID-19 PCR came back positive. EKG as reviewed by me : EKG showed sinus tachycardia with a rate of 1 1 with LAD and LVH. Imaging: Chest x-ray showed opacity in the left midlung concerning for pneumonia, emphysema, aortic atherosclerosis and severe elevation of the left hemidiaphragm.  The patient was given 40 mg of IV Lasix, DuoNeb and albuterol.  She will be admitted to a medical telemetry bed for further evaluation and management.   As per Dr. Rowe Pavy: This patient is a 88 year old female with history of asthma/COPD, hypertension, paroxysmal A. fib who presented here from home with complaints of acute onset of worsening dyspnea,  poor oral intake, cough, confusion.  On presentation, she was hypoxic on room air and had to be placed on 10 L of oxygen.  Chest x-ray showed opacity in the left midlung concerning for pneumonia, emphysema.  COVID screen test came out  to be positive.  Hospital course remarkable for worsening confusion, hypoxia.  Overall status was improving, nearing to discharge.  But in the morning of 07/19/2021, she went into A. fib with RVR requiring Cardizem drip.     As per Dr. Jimmye Norman 1/15-1/17/23: Pt has made a turn for the worse. Pt's mental status has declined as well as respiratory status. CXR showed pulmonary edema. Pt was placed on IV lasix. ABG was done also on 07/25/21 which showed O2 of < 31. Pt was initially placed on BiPAP but pt could not tolerate it as a result pt was placed on HFNC. The following day the pt's respiratory status did not improve but pt refused to keep HFNC on and pt was placed on [place]. Comfort care has been discussed w/ the pt's daughter and son but for now they want to continue w/ treatment but still are agreeable to DNR/DNI. Palliative care is also following, please see their note. Poor prognosis          [name] [name]  JHE:[id number] DOB: [birth year] DOA: 07/17/2021 PCP: Jerrol Banana., MD    Assessment & Plan:   Principal Problem:   Acute hypoxemic respiratory failure due  to COVID-19 [facility]) Active Problems:   Protein-calorie malnutrition, severe   Major depressive disorder, single episode, moderate (HCC)   Failure to thrive: secondary to all below. Palliative care is following and recs apprec.   Acute hypoxic respiratory failure: secondary to COVID-19 infection/COPD exacerbation/malignancy. Poor respiratory status, likely worse from day prior. Completed COVID19 treatment course. Continue on bronchodilators and encourage incentive spirometry. ABG shows pO2 < 31, tried on BiPAP but pt could not tolerate so pt placed on HFNC. Pt would not keep on HFNC so pt was placed  on [place] yesterday afternoon. Repeat CXR shows COPD changes & left lower lung mass again seen.  Hold lasix today for elevated Cr. High risk for further deterioration. Poor prognosis.   Acute pulmonary edema: as per CXR. Hold lasix today secondary to worsening Cr. Monitor I/Os   Debility: PT/OT initially recs HH   Left upper lobe lung mass: CT chest done on 07/15/2021 showed 3.4 x 2.9 cm spiculated mass consistent with malignancy.  Medical oncology and PCCM were consulted.  PCCM planning for bronchoscopy/PET scan as an outpatient.  Hypernatremia: free water deficit is 1.0L. Encourage free water intake     PAF: continue on CCB. Not on anticoagulation secondary to advanced age. Cardio recs apprec  Thrombocytopenia: labile, etiology unclear    Hx of COPD: continue treatment as stated above.    Acute metabolic encephalopathy: likely exacerbated from [employer]. CT head showed no acute intracranial findings. Poor mental status today    CKDII: Cr is trending up again today. Hold lasix   Hypokalemia: KCl repleated   HTN: continue on CCB   Severe protein calorie malnutrition: continue on nutritional supplements     DVT prophylaxis:  lovenox  Code Status: full  Family Communication: discussed pt's care w/ daughter, [name], and answered her questions  Disposition Plan:  unclear   Level of care: Progressive  Status is: Inpatient  Remains inpatient appropriate because: severity of illness, worsening dyspnea. High risk for further deterioration. Poor prognosis   Consultants:  Cardio Palliative care  pulmon  Procedures:  Antimicrobials:    Subjective: Pt is very confused and short of breath. Unable to answer question appropriately   Objective: Vitals:   07/27/21 0900 07/27/21 1000 07/27/21 1100 07/27/21 1500  BP: (!) 98/52 105/74 111/74 (!) 132/55  Pulse:   83 86  Resp: 20 (!) 22 20   Temp: 97.8 F (36.6 C)     TempSrc: Oral     SpO2: 96%  95% (!) 86%  Weight:      Height:         Intake/Output Summary (Last 24 hours) at 07/27/2021 1549 Last data filed at 07/26/2021 1942 Gross per 24 hour  Intake --  Output 0 ml  Net 0 ml   Filed Weights   07/22/2021 1706  Weight: 45 kg    Examination:  General exam: Appears uncomfortable. Frail appearing  Respiratory system: diminished breath sounds b/l. Accessory muscle use  Cardiovascular system: S1 & S2+. No rubs or gallops   Gastrointestinal system: Abd is soft, NT, ND & hypoactive bowel sounds Central nervous system: Lethargic. Does not follow simple commands  Psychiatry: judgement and insight appears not at baseline     Data Reviewed: I have personally reviewed following labs and imaging studies  CBC: Recent Labs  Lab 07/23/21 0547 07/26/21 0552 07/27/21 0727  WBC 10.8* 14.1* 9.5  HGB 11.1* 11.6* 11.1*  HCT 33.9* 35.2* 34.1*  MCV 96.3 94.4 94.5  PLT 276 149* 144*  Basic Metabolic Panel: Recent Labs  Lab 07/22/21 0739 07/23/21 0547 07/25/21 0531 07/26/21 0552 07/27/21 0727  NA 142 142 142 146* 147*  K 3.6 3.6 3.1* 3.4* 3.2*  CL 105 105 105 108 105  CO2 31 29 27 26 28   GLUCOSE 103* 109* 108* 106* 98  BUN 38* 38* 30* 32* 53*  CREATININE 1.24* 1.23* 1.10* 1.36* 2.12*  CALCIUM 8.7* 9.1 8.9 9.1 8.8*  MG 1.4* 2.3  --   --   --    GFR: Estimated Creatinine Clearance: 12.8 mL/min (A) (by C-G formula based on SCr of 2.12 mg/dL (H)). Liver Function Tests: Recent Labs  Lab 07/22/21 0739  AST 18  ALT 17  ALKPHOS 48  BILITOT 0.6  PROT 5.5*  ALBUMIN 3.4*   No results for input(s): LIPASE, AMYLASE in the last 168 hours. No results for input(s): AMMONIA in the last 168 hours. Coagulation Profile: No results for input(s): INR, PROTIME in the last 168 hours. Cardiac Enzymes: No results for input(s): CKTOTAL, CKMB, CKMBINDEX, TROPONINI in the last 168 hours. BNP (last 3 results) No results for input(s): PROBNP in the last 8760 hours. HbA1C: No results for input(s): HGBA1C in the last 72  hours. CBG: No results for input(s): GLUCAP in the last 168 hours. Lipid Profile: No results for input(s): CHOL, HDL, LDLCALC, TRIG, CHOLHDL, LDLDIRECT in the last 72 hours. Thyroid Function Tests: No results for input(s): TSH, T4TOTAL, FREET4, T3FREE, THYROIDAB in the last 72 hours. Anemia Panel: No results for input(s): VITAMINB12, FOLATE, FERRITIN, TIBC, IRON, RETICCTPCT in the last 72 hours. Sepsis Labs: No results for input(s): PROCALCITON, LATICACIDVEN in the last 168 hours.  No results found for this or any previous visit (from the past 240 hour(s)).       Radiology Studies: DG Chest Port 1 View  Result Date: 07/27/2021 CLINICAL DATA:  Acute renal failure EXAM: PORTABLE CHEST 1 VIEW COMPARISON:  Portable exam 1230 hours compared to 07/25/2021 FINDINGS: Persistent marked elevation of LEFT diaphragm. Normal heart size and mediastinal contours. Atherosclerotic calcification aorta. Calcified LEFT hilar lymph nodes. Mass can dental fide at lateral aspect of the inferior LEFT hemithorax 4.2 x 3.0 cm unchanged since CT. Bibasilar atelectasis and suspect underlying emphysematous changes. No definite acute infiltrate, pleural effusion or pneumothorax. Osseous demineralization with marked dextroconvex thoracolumbar scoliosis. IMPRESSION: Suspected COPD changes with bibasilar atelectasis. LEFT lower lung mass again seen. Chronic elevation of LEFT diaphragm. Aortic Atherosclerosis (ICD10-I70.0) and Emphysema (ICD10-J43.9). Electronically Signed   By: Lavonia Dana M.D.   On: 07/27/2021 12:43        Scheduled Meds:  vitamin C  500 mg Oral Daily   benzonatate  200 mg Oral TID   calcium-vitamin D  2 tablet Oral Daily   cholecalciferol  1,000 Units Oral Daily   citalopram  10 mg Oral Daily   diltiazem  60 mg Oral Q6H   famotidine  20 mg Oral Daily   feeding supplement  1 Container Oral TID BM   guaiFENesin-dextromethorphan  10 mL Oral Q6H   heparin injection (subcutaneous)  5,000 Units  Subcutaneous Q8H   melatonin  2.5 mg Oral QHS   multivitamin with minerals  1 tablet Oral Daily   polyethylene glycol  17 g Oral Daily   zinc sulfate  220 mg Oral Daily   Continuous Infusions:  potassium chloride 10 mEq (07/27/21 1519)     LOS: 16 days    Time spent: 25 mins     Wyvonnia Dusky, MD  [facility] Pager [phone]  If 7PM-7AM, please contact night-coverage 07/27/2021, 3:49 PM

## 2021-07-27 NOTE — Evaluation (Addendum)
Clinical/Bedside Swallow Evaluation Patient Details  Name: [name] [name] MRN: [id number] Date of Birth: 1929-11-05  Today's Date: 07/27/2021 Time: SLP Start Time (ACUTE ONLY): 1000 SLP Stop Time (ACUTE ONLY): 1100 SLP Time Calculation (min) (ACUTE ONLY): 60 min  Past Medical History:  Past Medical History:  Diagnosis Date   Asthma    Cataract    Chest pain    a. 11/2006 Cath: Nl cors, nl filling pressures.   COPD (chronic obstructive pulmonary disease) (HCC)    -FeV1 74% DLCO 01%   Diastolic dysfunction    a. 12/2006 Echo: EF 70-75%, Gr1 DD; b. 11/2013 Echo: EF 55-60%, mild MR/TR.   Emphysema    HTN (hypertension)    PAF (paroxysmal atrial fibrillation) (HCC)    a. CHA2DS2VASc = 4.  Not felt to be suitable OAC candidate 2/2 falls/unsteady gait.   Past Surgical History:  Past Surgical History:  Procedure Laterality Date   ankle surgery with implants Left 1994   Bening Tumor Removal from Thyroid   1950's   EYE SURGERY  2014   catarac   HERNIA REPAIR  1960's   Lacrimal gland surgery  2000   lumbar spine fracture and ankle fracture     MASS EXCISION N/A 02/15/2021   Procedure: TRANSANAL MASS EXCISION;  Surgeon: Robert Bellow, MD;  Location: [facility] ORS;  Service: General;  Laterality: N/A;  transanal   Right hip surgery with implants   1996   and LEFT Hip   status post nasal miacalcin     TONSILLECTOMY AND ADENOIDECTOMY  1950   TOTAL HIP ARTHROPLASTY Bilateral    HPI:  Pt is an 88 y.o. female  with past medical history of Multiple medical issues including Barrett's Esophagus w/ dysplasia, Emphysema, COPD, asthma, HTN, paroxysmal A. fib admitted on 08/09/2021 with worsening dyspnea, poor oral intake, cough and confusion.  Patient found to be COVID-positive on 07/07/2021 per home test w/ family per Chart Notes -- "Congested, upset stomach , diarrhea, nausea, coughing phlegm" TC to PCP office.       CT of Chest revealed a spiculated mass in the left upper lobe. CXR 07/21/2021:  "Unchanged peripheral left upper lobe mass.".  Per Dietician report: "Severe malnutrition in context of chronic illness".    Assessment / Plan / Recommendation  Clinical Impression   Pt was seen today for repeat assessment of swallowing per MD and Palliative Care request. Pt had a decline in medical status since previous assessment.  Pt appears to present w/ pharyngoesophageal phase dysphagia at Baseline(h/o Barrett's Esophagus and ongoing c/o Esophageal phase dysmotility per Family); but also concern for pharyngeal phase dysphagia in light of declined Cognitive status at this presentation/hospitalization. Unsure of pt's Baseline Cognitive status. ANY Cognitive decline can impact overall awareness/timing of swallow and safety during po tasks which increases risk for aspiration, choking.   Pt's risk for aspiration of oral intake is increased secondary to declined medical/Pulmonary status' and co-morbidities including lung mass, lengthy illness w/ deconditioning, reduced desire of oral intake, and Cognitive decline.   Pt has had a decline since initial evaluation on 07/21/2021 by ST services. This included Pulmonary decline requiring increased O2 needs. She has weaned to  O2 of 4L today. She required MOD verbal/visual cues during po tasks at this re-assessment today.   Pt consumed only few trials of ice chip, purees w/ Crushed Pills, and thin liquids(her frantic request for "water" as she took the medicine) w/ overt clinical s/s of aspiration noted w/ post sips of  thin liquids via Cup. No overt clinical s/s of aspiration noted w/ ice chip and puree; Dtr reported pt tolerated a Nectar consistency juice last night "well" w/ no coughing(NSG present -- no reports of poor tolerance of such in chart notes). Due to pt's overall confusion and increased movements/reactions, respiratory status during/post trials was min increased -- suspect more d/t exertion and her confusion. No coughing or decline in vocal  quality was noted w/ ice chip, puree trials. OF NOTE: Pt exhibited gagging behavior w/ Pills Crushed in puree -- suspect d/t taste. Oral phase was adequate for bolus management and oral clearing of the boluses given. Pt held own Cup during drinking which improves safety of swallowing. OM Exam function appeared wfl w/ No unilateral weakness noted.     D/t pt's declined Cognitive status w/ Parkinson's Dis. and her risk for aspiration in current setting, recommend initiation of an oral diet b/c pt does demonstrate intent for oral intake in her gestures and verbalizations. Recommend a dysphagia level 1(puree) w/ Nectar liquids via Cup; aspiration precautions; reduce Distractions during meals and engage pt during po's at meal for self-feeding. Rest Breaks during meals. Pills Crushed in Puree for safer swallowing. Support w/ feeding at meals as needed. MD/NSG updated. ST services recommends continued follow w/ Palliative Care for [place] and education re: impact of Cognitive decline/Dementia and Esophageal dysmotility on swallowing. ST services will continue to monitor pt's status while admitted for appropriateness for diet upgrade. Recommend monitoring for any negative sequelae from aspiration.    OF NOTE: Pt has a Baseline Dx of Barrett's Esophagus w/ Dysplasia. ANY Dysmotility or Regurgitation of Reflux material can increase risk for aspiration of the Reflux material during Retrograde backflow thus impact Pulmonary status as well as reduce desire for oral intake. Pt described ongoing issues w/ "it's stuck in my chest" pointing to mid sternum area then described the discomfort moves superiorly pointing to her below her Sternal Notch area. She feels this impacts her ability/desire to take full meals(many solid foods). ALSO, her preference to eat SWEETS and Tea(thin liquid). Any Cognitive decline can also impact desire for oral intake; preferences for foods and a gravitation towards sweets for her diet. Family reported  she is no longer eating some of her "favorite foods" such as spaghetti but eating more cookies, ice cream, and poundcake at home. This has been ongoing for "some time". FTT? SLP Visit Diagnosis: Dysphagia, pharyngoesophageal phase (R13.14) (Barrett's Esophagus baseline)    Aspiration Risk  Mild aspiration risk;Moderate aspiration risk;Risk for inadequate nutrition/hydration (from an Esophageal phase standpoint as well -- regurgitation)    Diet Recommendation   Dysphagia level 1(puree) w/ Nectar liquids via Cup; aspiration precautions; reduce Distractions during meals and engage pt during po's at meal for self-feeding. Support w/ feeding at meals as needed. Rest Breaks during meals to allow for clearing.   Medication Administration: Crushed with puree    Other  Recommendations Recommended Consults: Consider GI evaluation;Consider esophageal assessment (for pt/family education) Oral Care Recommendations: Oral care BID;Oral care before and after PO;Staff/trained caregiver to provide oral care Other Recommendations: Order thickener from pharmacy;Prohibited food (jello, ice cream, thin soups);Remove water pitcher;Have oral suction available    Recommendations for follow up therapy are one component of a multi-disciplinary discharge planning process, led by the attending physician.  Recommendations may be updated based on patient status, additional functional criteria and insurance authorization.  Follow up Recommendations Skilled nursing-short term rehab (<3 hours/day)      Assistance Recommended at Discharge  Intermittent Supervision/Assistance  Functional Status Assessment Patient has had a recent decline in their functional status and/or demonstrates limited ability to make significant improvements in function in a reasonable and predictable amount of time  Frequency and Duration min 3x week  2 weeks       Prognosis Prognosis for Safe Diet Advancement: Fair Barriers to Reach Goals: Cognitive  deficits;Time post onset;Severity of deficits;Behavior (Barrett's Esophagus)      Swallow Study   General Date of Onset: 08/03/2021 HPI: Pt is an 88 y.o. female  with past medical history of Multiple medical issues including Barrett's Esophagus w/ dysplasia, Emphysema, COPD, asthma, HTN, paroxysmal A. fib admitted on 07/17/2021 with worsening dyspnea, poor oral intake, cough and confusion.  Patient found to be COVID-positive on 07/07/2021 per home test w/ family per Chart Notes -- "Congested, upset stomach , diarrhea, nausea, coughing phlegm" TC to PCP office.      CT of Chest revealed a spiculated mass in the left upper lobe. CXR 07/21/2021: "Unchanged peripheral left upper lobe mass.". Type of Study: Bedside Swallow Evaluation (repeat assessment requested) Previous Swallow Assessment: 07/21/2021 -- this admit Diet Prior to this Study: Dysphagia 3 (soft);Thin liquids Temperature Spikes Noted: No (wbc 9.5) Respiratory Status: Nasal cannula (4L) History of Recent Intubation: No Behavior/Cognition: Alert;Cooperative;Pleasant mood;Confused;Distractible;Requires cueing (suspect Baseline Cognitive decline per her engagement) Oral Cavity Assessment: Within Functional Limits Oral Care Completed by SLP: Recent completion by staff Vision: Functional for self-feeding Self-Feeding Abilities: Able to feed self;Needs set up;Needs assist (Supervision) Patient Positioning: Upright in bed (needed positioning support) Baseline Vocal Quality: Normal Volitional Cough: Strong;Congested Volitional Swallow: Able to elicit    Oral/Motor/Sensory Function Overall Oral Motor/Sensory Function: Within functional limits   Ice Chips Ice chips: Within functional limits Presentation: Spoon (fed; 1 trial)   Thin Liquid Thin Liquid: Impaired Presentation: Cup;Self Fed (~4-5 sip trials) Oral Phase Impairments:  (adequate) Pharyngeal  Phase Impairments: Throat Clearing - Delayed;Cough - Immediate;Suspected delayed Swallow;Change  in Vital Signs (x1 each; O2 desat w/ and post coughing noted)    Nectar Thick Nectar Thick Liquid: Not tested   Honey Thick Honey Thick Liquid: Not tested   Puree Puree: Within functional limits Presentation: Spoon (fed; 3 trials) Other Comments: pt swallowed but exhibited gagging behavior -- suspect d/t dislike of taste of crushed meds in puree.   Solid     Solid: Not tested        Orinda Kenner, MS, CCC-SLP Speech Language Pathologist Rehab Services; [place] [phone] (ascom) Tiquan Bouch 07/27/2021,3:16 PM

## 2021-07-27 NOTE — Progress Notes (Signed)
Nutrition Follow-up  DOCUMENTATION CODES:   Severe malnutrition in context of chronic illness  INTERVENTION:   -RD will follow for goals of care and make recommendations as appropriate  NUTRITION DIAGNOSIS:   Severe Malnutrition related to chronic illness (COPD) as evidenced by estimated needs.  Ongoing  GOAL:   Patient will meet greater than or equal to 90% of their needs  Unmet  MONITOR:   PO intake, Supplement acceptance, Labs, Weight trends, Skin, I & O's  REASON FOR ASSESSMENT:   Consult Assessment of nutrition requirement/status  ASSESSMENT:   88 y/o female with h/o COPD, HTN, Barrett's esophagus, HLD and Afib who is admitted with COVID 19.  Reviewed I/O's: 0 ml x 24 hours and +1.2 L since 07/13/21  Pt currently NPO and is too lethargic to take meds and PO. Palliative care consulted to discuss goals of care, including potential transition to comfort care.   No wt readings available since admission.    Medications reviewed and include vitamin C, calcium-vitamin D, vitamin D3, cardizem, and lasix.   Labs reviewed: Na: 147, K: 3.2.     Diet Order:   Diet Order             DIET - DYS 1 Room service appropriate? Yes with Assist; Fluid consistency: Nectar Thick  Diet effective now                   EDUCATION NEEDS:   Education needs have been addressed  Skin:  Skin Assessment: Reviewed RN Assessment  Last BM:  07/24/21  Height:   Ht Readings from Last 1 Encounters:  08/03/2021 5\' 3"  (1.6 m)    Weight:   Wt Readings from Last 1 Encounters:  07/28/2021 45 kg    Ideal Body Weight:  52.3 kg  BMI:  Body mass index is 17.59 kg/m.  Estimated Nutritional Needs:   Kcal:  1400-1600  Protein:  60-75 grams  Fluid:  > 1.4 L    Loistine Chance, RD, LDN, [place] Registered Dietitian II Certified Diabetes Care and Education Specialist Please refer to [facility] for RD and/or RD on-call/weekend/after hours pager

## 2021-07-27 NOTE — Progress Notes (Signed)
° °      CROSS COVER NOTE  NAME: [name] [name] MRN: [id number] DOB : 08-Aug-1929   Contacted by nursing to discuss discomfort giving metoprolol since it was listed on the patient's allergy list.  Educated nursing metoprolol was likely ordered because the listed allergy is foot pain and swelling. Offered to order a one-time PRN dose of Benadryl to give in the event of an allergic reaction to make nurse more comfortable giving metoprolol.  As needed dose of Benadryl was administered and on follow-up nurse reported that patient did not have an allergic reaction or foot pain/swelling and the dose was given prophylactically.  Neomia Glass MHA, MSN, FNP-BC Nurse Practitioner [facility] Pager [phone]

## 2021-07-28 ENCOUNTER — Inpatient Hospital Stay: Payer: Medicare Other

## 2021-07-28 DIAGNOSIS — N179 Acute kidney failure, unspecified: Secondary | ICD-10-CM

## 2021-07-28 LAB — PHOSPHORUS: Phosphorus: 4.5 mg/dL (ref 2.5–4.6)

## 2021-07-28 LAB — BASIC METABOLIC PANEL
Anion gap: 11 (ref 5–15)
BUN: 69 mg/dL — ABNORMAL HIGH (ref 8–23)
CO2: 28 mmol/L (ref 22–32)
Calcium: 8.4 mg/dL — ABNORMAL LOW (ref 8.9–10.3)
Chloride: 105 mmol/L (ref 98–111)
Creatinine, Ser: 2.83 mg/dL — ABNORMAL HIGH (ref 0.44–1.00)
GFR, Estimated: 15 mL/min — ABNORMAL LOW (ref >60)
Glucose, Bld: 95 mg/dL (ref 70–99)
Potassium: 3.3 mmol/L — ABNORMAL LOW (ref 3.5–5.1)
Sodium: 144 mmol/L (ref 135–145)

## 2021-07-28 LAB — CBC
HCT: 31.8 % — ABNORMAL LOW (ref 36.0–46.0)
Hemoglobin: 10.4 g/dL — ABNORMAL LOW (ref 12.0–15.0)
MCH: 31.3 pg (ref 26.0–34.0)
MCHC: 32.7 g/dL (ref 30.0–36.0)
MCV: 95.8 fL (ref 80.0–100.0)
Platelets: 119 10*3/uL — ABNORMAL LOW (ref 150–400)
RBC: 3.32 MIL/uL — ABNORMAL LOW (ref 3.87–5.11)
RDW: 13.9 % (ref 11.5–15.5)
WBC: 8.6 10*3/uL (ref 4.0–10.5)
nRBC: 0 % (ref 0.0–0.2)

## 2021-07-28 LAB — VITAMIN B12: Vitamin B-12: 137 pg/mL — ABNORMAL LOW (ref 180–914)

## 2021-07-28 LAB — IRON AND TIBC
Iron: 47 ug/dL (ref 28–170)
Saturation Ratios: 31 % (ref 10.4–31.8)
TIBC: 151 ug/dL — ABNORMAL LOW (ref 250–450)
UIBC: 104 ug/dL

## 2021-07-28 LAB — MAGNESIUM: Magnesium: 1.6 mg/dL — ABNORMAL LOW (ref 1.7–2.4)

## 2021-07-28 LAB — VITAMIN D 25 HYDROXY (VIT D DEFICIENCY, FRACTURES): Vit D, 25-Hydroxy: 36.79 ng/mL (ref 30–100)

## 2021-07-28 LAB — FOLATE: Folate: 13.4 ng/mL (ref >5.9)

## 2021-07-28 MED ORDER — NEPRO/CARBSTEADY PO LIQD
237.0000 mL | Freq: Three times a day (TID) | ORAL | Status: DC
  Administered 2021-07-29: 237 mL via ORAL

## 2021-07-28 MED ORDER — CYANOCOBALAMIN 1000 MCG/ML IJ SOLN
1000.0000 ug | Freq: Every day | INTRAMUSCULAR | Status: DC
  Administered 2021-07-28 – 2021-08-01 (×5): 1000 ug via INTRAMUSCULAR
  Filled 2021-07-28 (×5): qty 1

## 2021-07-28 MED ORDER — HALOPERIDOL LACTATE 5 MG/ML IJ SOLN
1.0000 mg | Freq: Four times a day (QID) | INTRAMUSCULAR | Status: AC | PRN
  Administered 2021-07-28 – 2021-07-29 (×2): 1 mg via INTRAVENOUS
  Filled 2021-07-28 (×2): qty 1

## 2021-07-28 MED ORDER — THIAMINE HCL 100 MG/ML IJ SOLN
100.0000 mg | Freq: Once | INTRAMUSCULAR | Status: AC
Start: 2021-07-28 — End: 2021-07-28
  Administered 2021-07-28: 100 mg via INTRAVENOUS
  Filled 2021-07-28: qty 2

## 2021-07-28 MED ORDER — VITAMIN B-12 1000 MCG PO TABS: 500.0000 ug | ORAL_TABLET | Freq: Every day | ORAL | Status: DC

## 2021-07-28 MED ORDER — MAGNESIUM SULFATE 2 GM/50ML IV SOLN
2.0000 g | Freq: Once | INTRAVENOUS | Status: AC
  Administered 2021-07-28: 2 g via INTRAVENOUS
  Filled 2021-07-28: qty 50

## 2021-07-28 MED ORDER — POTASSIUM CHLORIDE CRYS ER 20 MEQ PO TBCR
40.0000 meq | EXTENDED_RELEASE_TABLET | Freq: Once | ORAL | Status: AC
  Administered 2021-07-28: 40 meq via ORAL
  Filled 2021-07-28: qty 2

## 2021-07-28 NOTE — Progress Notes (Signed)
OT Cancellation Note  Patient Details Name: [name] [name] MRN: [id number] DOB: October 12, 1929   Cancelled Treatment:    Reason Eval/Treat Not Completed: Patient's level of consciousness;Medical issues which prohibited therapy Pt confused, not following commands well.  Family waiting to meet with MD; family still undecided about comfort measures.   Leta Speller, MS, OTR/L  [name] 07/28/2021, 1:38 PM

## 2021-07-28 NOTE — Progress Notes (Signed)
Nutrition Follow-up  DOCUMENTATION CODES:   Severe malnutrition in context of chronic illness  INTERVENTION:   -Continue Magic cup TID with meals, each supplement provides 290 kcal and 9 grams of protein  -Continue MVI with minerals daily -D/c Boost Breeze -Nepro Shake po TID, each supplement provides 425 kcal and 19 grams protein   NUTRITION DIAGNOSIS:   Severe Malnutrition related to chronic illness (COPD) as evidenced by estimated needs.  Ongoing  GOAL:   Patient will meet greater than or equal to 90% of their needs  Unmet  MONITOR:   PO intake, Supplement acceptance, Labs, Weight trends, Skin, I & O's  REASON FOR ASSESSMENT:   Consult Assessment of nutrition requirement/status  ASSESSMENT:   88 y/o female with h/o COPD, HTN, Barrett's esophagus, HLD and Afib who is admitted with COVID 19.  1/17- s/p BSE- advanced to dysphagia 1 diet with nectar thick liquids  Reviewed I/O's: +240 ml x 24 hours and +2.3 L since 07/14/21   Pt with waxing and waning mental status. Palliative care following; would like to continue current care for the next few days and reassess. Pt is currently on nectar thick liquids, so will switch supplement to Nepro. Noted meal completions 0%.  Medications reviewed and include vitamin C, vitamin D3, vitamin B-12, cardizem, miralax, vitamin B-12, and zinc sulfate.  Labs reviewed: K: 3.3. Mg: 1.6.    Diet Order:   Diet Order             DIET - DYS 1 Room service appropriate? Yes with Assist; Fluid consistency: Nectar Thick  Diet effective now                   EDUCATION NEEDS:   Education needs have been addressed  Skin:  Skin Assessment: Reviewed RN Assessment  Last BM:  07/24/21  Height:   Ht Readings from Last 1 Encounters:  08/07/2021 5\' 3"  (1.6 m)    Weight:   Wt Readings from Last 1 Encounters:  07/26/2021 45 kg    Ideal Body Weight:  52.3 kg  BMI:  Body mass index is 17.59 kg/m.  Estimated Nutritional Needs:    Kcal:  1400-1600  Protein:  60-75 grams  Fluid:  > 1.4 L    Loistine Chance, RD, LDN, [place] Registered Dietitian II Certified Diabetes Care and Education Specialist Please refer to [facility] for RD and/or RD on-call/weekend/after hours pager

## 2021-07-28 NOTE — Progress Notes (Signed)
Palliative Care Progress Note, Assessment & Plan   Patient Name: [name] [name]       Date: 07/28/2021 DOB: Jul 11, 1930  Age: 88 y.o. MRN#: [id number] Attending Physician: Val Riles, MD Primary Care Physician: Jerrol Banana., MD Admit Date: 07/27/2021  Reason for Consultation/Follow-up: Establishing goals of care  Subjective: Upon entering the room patient was not present as she was taken for renal ultrasound.  Daughter and son present in room.  After a few minutes of our discussion, patient returned to the room.  Patient was somnolent but arousable.  She acknowledged she was awake and was able to make her needs known.  However, she appeared confused started reaching for things and talking about things that were not present in the room.  HPI: 88 y.o. female  with past medical history of COPD, asthma, HTN, paroxysmal A. fib admitted on 07/28/2021 with worsening dyspnea, poor oral intake, cough and confusion.  Patient found to be COVID-positive.  Family endorses first positive home test occurred 08/05/2021.  1/5, CT of chest revealed a spiculated mass in the left upper lobe.  1/5, ultrasound of lower extremities revealed no evidence of a DVT.  1/6, CT of head revealed no intracranial abnormalities.   1/9, Afib with RVR - cardizem drip.    1/10 Covid precautions discontinued. Pt still requiring oxygen and waxing/waning participation with PT/OT.   1/18 - pt continues to be confused, somnolent, and waxing/waning mental status.  Summary of counseling/coordination of care: After reviewing the patient's chart I spoke with the patient's daughter and son in the patient's room.  They shared many concerns that the medications were contributing to the patient's confusion.  Reviewed medication list and  discussed multifactorial reasons for acute encephalopathy. Education provided when the body begins to fail to thrive, acute kidney injury, advanced age, long COVID "brain fog", and human mortality.  Patient returned to room from ultrasound.  Patient appeared more confused and somnolent than yesterday.  She was able to open her eyes and confirm that she felt okay and did not need anything at this time.  Therapeutic silence and active listening provided for both daughter and son to share their concerns regarding patient's current health status.  Son shared he is looking for something to "hold onto and hope for".  Family is eager to know results of renal ultrasound as well as compared chest x-ray of from yesterday to previous chest x-rays.  Family continues to be concerned that medication is creating patient's overall poor prognosis.  Dr. Dwyane Dee plans to round on patient.  If available I plan to be part of this meeting as well.   Discussed with patient/family the importance of continued conversation with family and the medical providers regarding overall plan of care and treatment options, ensuring decisions are within the context of the patients values and GOCs.   Questions and concerns were addressed.  Palliative medicine team will continue to be available to the patient and her family throughout her hospitalization.  Code Status: DNR  Prognosis: Unable to determine  Discharge Planning: To Be Determined  Recommendations/Plan: [place] goals of care discussions with patient/family DNR remains Treat the treatable  Care plan was discussed with patient, patient's  son and daughter, nursing, Dr. Dwyane Dee  Physical Exam Vitals and nursing note reviewed.  Constitutional:      General: She is not in acute distress.    Appearance: She is ill-appearing. She is not toxic-appearing.  HENT:     Head: Normocephalic and atraumatic.  Cardiovascular:     Rate and Rhythm: Normal rate.  Pulmonary:      Effort: Pulmonary effort is normal.  Abdominal:     Palpations: Abdomen is soft.  Musculoskeletal:     Comments: Generalized weakness  Skin:    General: Skin is warm and dry.  Neurological:     Mental Status: She is disoriented.     Motor: Weakness present.  Psychiatric:        Mood and Affect: Mood is not anxious.        Behavior: Behavior is not agitated.            Palliative Assessment/Data: 30%    Total Time 20 minutes  Greater than 50%  of this time was spent counseling and coordinating care related to the above assessment and plan.  Thank you for allowing the Palliative Medicine Team to assist in the care of this patient.  [place] Ilsa Iha, FNP-BC Palliative Medicine Team Team Phone # [phone]

## 2021-07-28 NOTE — Progress Notes (Signed)
PT Cancellation Note  Patient Details Name: [name] [name] MRN: [id number] DOB: 1929-11-07   Cancelled Treatment:     PT attempt, Pt continues to be severely confused and per family." She can't do it today. She wasn't like this yesterday." PT will continue to follow per current POC. Daughter requesting to speak to MD about her concerns. He arrived shortly after I left room.    Willette Pa 07/28/2021, 3:59 PM

## 2021-07-28 NOTE — Progress Notes (Signed)
Speech Language Pathology Treatment: Dysphagia  Patient Details Name: [name] [name] MRN: [id number] DOB: 1929/11/18 Today's Date: 07/28/2021 Time: 2706-2376 SLP Time Calculation (min) (ACUTE ONLY): 45 min  Assessment / Plan / Recommendation Clinical Impression  Pt seen for ongoing assessment of toleration of dysphagia diet; ongoing education w/ both pt and Family members re: pt's swallowing presentation/dysphagia w/ impact of illness and Cognitive decline. Pt was given medications(per report?) last evening d/t agitation(noted restlessness this morning) and this morning appears too lethargic for safe participation w/ oral intake/breakfast tray. Daughter present in room.  Education given to Dtr(then Son later in morning) re: impact of Cognitive decline and extended illness on swallowing, Dysphagia. Discussed s/s of Dysphagia. Also discussed the impact of Barrett's Esophagus/Esophageal phase dysmotility on desire for oral intake -- ongoing reduced oral intake PTA. Pt had also moved to less oral intake and more SWEETS for oral intake PTA per the Family. Discussed strict aspiration precautions as posted in room including NO oral intake given unless fully awake/alert to safely participate in oral intake -- pt has required sedating medications recently d/t behaviors moreso at night. Instruction given to NSG on need to assess pt's State and alertness prior to all meals, meds given. NSG agreed.  Reviewed aspiration precautions as posted including supporting feeding pt and monitoring tolerance w/ po's. Recommended remaining on current Dysphagia diet w/ Nectar liquids to reduce risk for aspiration. Pills Crushed in puree w/ NSG.  ST services will continue to monitor pt's status while admitted and can follow pt/Family for any further education needed while admitted. Largely suspect that pt's Cognitive decline and illness could hamper any upgrade of diet back to thin liquids, solids. Precautions posted in room. Pt  remains at High risk for aspiration secondary to acute and chronic medical conditions; Cognitive decline.     HPI HPI: Pt is an 88 y.o. female  with past medical history of Multiple medical issues including Barrett's Esophagus w/ dysplasia, Emphysema, COPD, asthma, HTN, paroxysmal A. fib admitted on 07/23/2021 with worsening dyspnea, poor oral intake, cough and confusion.  Patient found to be COVID-positive on 07/07/2021 per home test w/ family per Chart Notes -- "Congested, upset stomach , diarrhea, nausea, coughing phlegm" TC to PCP office.      CT of Chest revealed a spiculated mass in the left upper lobe. CXR 07/21/2021: "Unchanged peripheral left upper lobe mass.".      SLP Plan  Continue with current plan of care (montior status next 1-2 days)      Recommendations for follow up therapy are one component of a multi-disciplinary discharge planning process, led by the attending physician.  Recommendations may be updated based on patient status, additional functional criteria and insurance authorization.    Recommendations  Diet recommendations: Dysphagia 1 (puree);Nectar-thick liquid (hold if not fully awake/alert) Liquids provided via: Cup;No straw Medication Administration: Crushed with puree Supervision: Staff to assist with self feeding;Full supervision/cueing for compensatory strategies Compensations: Minimize environmental distractions;Slow rate;Small sips/bites;Lingual sweep for clearance of pocketing;Multiple dry swallows after each bite/sip;Follow solids with liquid Postural Changes and/or Swallow Maneuvers: Out of bed for meals;Seated upright 90 degrees;Upright 30-60 min after meal                General recommendations:  (palliative care f/u; dietician f/u) Oral Care Recommendations: Oral care BID;Oral care before and after PO;Staff/trained caregiver to provide oral care Follow Up Recommendations: Skilled nursing-short term rehab (<3 hours/day) Assistance recommended at  discharge: Frequent or constant Supervision/Assistance SLP Visit Diagnosis: Dysphagia,  oropharyngeal phase (R13.12);Dysphagia, pharyngoesophageal phase (R13.14) (Barrett's esophagus; Cognitive decline) Plan: Continue with current plan of care (montior status next 1-2 days)            Orinda Kenner, MS, CCC-SLP Speech Language Pathologist Rehab Services; [place] [phone] (ascom) Murry Khiev  07/28/2021, 4:36 PM

## 2021-07-28 NOTE — Progress Notes (Signed)
[facility] Progress Note  Patient: [name] [name]    RAQ:[id number]  DOA: 08/09/2021     Date of Service: the patient was seen and examined on 07/28/2021  Chief Complaint  Patient presents with   Shortness of Breath   Brief hospital course: HPI was taken from Dr. Sidney [name]: [name] [name] is a 88 y.o. Caucasian female with medical history significant for asthma/COPD and hypertension, who presented to the ER with acute onset of worsening dyspnea over the last 3 days.  The patient had a positive COVID-19 test on Thursday.  She has not been eating or drinking for the last few days.  She was noted to have mild confusion in the ER.  No loss of taste or smell.  She did have nausea, vomiting and diarrhea per her daughter today.  No chest pain or palpitations.  Per EMS her pulse oximetry was 81% on room air.  She was placed on 10 L with nonrebreather and pulse oximetry was in the mid 90s.  She was also given 1 DuoNeb and 1 albuterol nebulizer on route to the hospital.  She admitted to dry cough without expectoration.  She denies any nausea or vomiting however admits to lower abdominal pain.  No chest pain or palpitations.     ED Course: Upon presentation to the emergency room respiratory rate was 34 and pulse oximetry 87% on room air with a pulse oximetry of 144/59.  Labs revealed a VBG with pH 7.4 and HCO3 31.  BMP was remarkable for hypokalemia hyponatremia and hypochloremia and CBC showed leukopenia of 2.1 and thrombocytopenia with otherwise normal levels.  Influenza antigens came back negative.  COVID-19 PCR came back positive. EKG as reviewed by me : EKG showed sinus tachycardia with a rate of 1 1 with LAD and LVH. Imaging: Chest x-ray showed opacity in the left midlung concerning for pneumonia, emphysema, aortic atherosclerosis and severe elevation of the left hemidiaphragm.  The patient was given 40 mg of IV Lasix, DuoNeb and albuterol.  She will be admitted to a medical telemetry bed for  further evaluation and management.     As per Dr. Rowe Pavy: This patient is a 88 year old female with history of asthma/COPD, hypertension, paroxysmal A. fib who presented here from home with complaints of acute onset of worsening dyspnea, poor oral intake, cough, confusion.  On presentation, she was hypoxic on room air and had to be placed on 10 L of oxygen.  Chest x-ray showed opacity in the left midlung concerning for pneumonia, emphysema.  COVID screen test came out  to be positive.  Hospital course remarkable for worsening confusion, hypoxia.  Overall status was improving, nearing to discharge.  But in the morning of 07/19/2021, she went into A. fib with RVR requiring Cardizem drip.       As per Dr. Jimmye Norman 1/15-1/17/23: Pt has made a turn for the worse. Pt's mental status has declined as well as respiratory status. CXR showed pulmonary edema. Pt was placed on IV lasix. ABG was done also on 07/25/21 which showed O2 of < 31. Pt was initially placed on BiPAP but pt could not tolerate it as a result pt was placed on HFNC. The following day the pt's respiratory status did not improve but pt refused to keep HFNC on and pt was placed on [place]. [name] care has been discussed w/ the pt's daughter and son but for now they want to continue w/ treatment but still are agreeable to DNR/DNI. Palliative care is also  following, please see their note. Poor prognosis   Please review prior detailed notes.  I started taking care of this patient on 07/28/2021  Assessment and Plan: Principal Problem:   Acute hypoxemic respiratory failure due to COVID-19 [facility]) Active Problems:   Protein-calorie malnutrition, severe   Major depressive disorder, single episode, moderate (HCC)      Acute hypoxic respiratory failure: secondary to COVID-19 infection/COPD exacerbation/malignancy.  Completed COVID19 treatment course.  Continue on bronchodilators and encourage incentive spirometry.  ABG shows pO2 < 31, tried on BiPAP but pt  could not tolerate so pt placed on HFNC.  Pt would not keep on HFNC so pt was placed on [place]  Repeat CXR shows COPD changes & left lower lung mass again seen.   Hold lasix today for elevated Cr. High risk for further deterioration. Poor prognosis.  Acute pulmonary edema: as per CXR. Hold lasix today secondary to worsening Cr. Monitor I/Os   Acute metabolic encephalopathy: likely exacerbated from [place]. CT head showed no acute intracranial findings. Poor mental status  1/18 patient was restless, Haldol as needed ordered    Left upper lobe lung mass: CT chest done on 07/15/2021 showed 3.4 x 2.9 cm spiculated mass consistent with malignancy.  Medical oncology and PCCM were consulted.  PCCM planning for bronchoscopy/PET scan as an outpatient.   Hypernatremia: free water deficit is 1.0L. Encourage free water intake     PAF: continue on CCB. Not on anticoagulation secondary to advanced age. Cardio recs apprec   Thrombocytopenia: labile, etiology unclear    Hx of COPD: continue treatment as stated above.     CKDII: Cr is trending up again today. Hold lasix  US renal negative for any obstruction Urine retention, Foley catheter was inserted on 1/18   Hypokalemia: KCl repleated Hypomagnesemia, mag repleted.  HTN: continue on CCB   Vitamin B12 deficiency, vitamin B12 level 137, target >400, started vitamin B12 1000 mcg IM injection daily followed by oral supplement on discharge.  Follow with PCP to repeat vitamin B12 level after 3 months.  Failure to thrive: secondary to all below. Palliative care is following and recs apprec.  Severe protein calorie malnutrition: continue on nutritional supplements  Body mass index is 17.59 kg/m.  Nutrition Problem: Severe Malnutrition Etiology: chronic illness (COPD) Interventions:   Debility: PT/OT initially recs HH     Diet: Dysphagia 1 nectar thick liquids DVT Prophylaxis: Subcutaneous Heparin    Advance goals of care discussion: DNR  Family  Communication: family was present at bedside, at the time of interview.  The pt provided permission to discuss medical plan with the family. Opportunity was given to ask question and all questions were answered satisfactorily.   Disposition:  Pt is from Home, admitted with respiratory failure due to COVID viral pneumonia, still has respiratory failure, AMS, which precludes a safe discharge. Discharge to TBD PT and OT eval pending, still clinically not stable to discharge, may require 3 to 5 days more..  Subjective: No significant events overnight, the morning time patient was agitated and restless, AAO x0.  Family was at bedside, patient was having increased work of breathing, family does not wanted BiPAP so ABG was not done, we will give Haldol to calm down the patient and patient was retaining urine, Foley catheter was inserted. Goals of care discussed with family.  Physical Exam: General:  obtunded not oriented to time, place, and person.  Appear in moderate distress, affect anxious Eyes: Mostly closed, briefly opened, AMS  ENT: Oral  Mucosa dry  Neck: no JVD,  Cardiovascular: S1 and S2 Present, no Murmur,  Respiratory: increased respiratory effort, Bilateral Air entry equal and Decreased, mild Crackles, no wheezes Abdomen: Bowel Sound present, Soft and no tenderness,  Skin: no rashes Extremities: no Pedal edema, no calf tenderness Neurologic: without any new focal findings Gait not checked due to patient safety concerns  Vitals:   07/27/21 1956 07/28/21 0452 07/28/21 0705 07/28/21 1536  BP:  (!) 132/52 114/69 (!) 148/65  Pulse:  76 77 89  Resp:  (!) 21 20 18   Temp: 98.1 F (36.7 C) 97.7 F (36.5 C) 98.3 F (36.8 C) 98.2 F (36.8 C)  TempSrc:  Axillary Axillary Axillary  SpO2:  97% 97% 97%  Weight:      Height:        Intake/Output Summary (Last 24 hours) at 07/28/2021 1714 Last data filed at 07/28/2021 1300 Gross per 24 hour  Intake 240 ml  Output --  Net 240 ml    Filed Weights   07/29/2021 1706  Weight: 45 kg    Data Reviewed: I have personally reviewed and interpreted daily labs, tele strips, imagings as discussed above. I reviewed all nursing notes, pharmacy notes, vitals, pertinent old records I have discussed plan of care as described above with RN and patient/family.  CBC: Recent Labs  Lab 07/23/21 0547 07/26/21 0552 07/27/21 0727 07/28/21 0651  WBC 10.8* 14.1* 9.5 8.6  HGB 11.1* 11.6* 11.1* 10.4*  HCT 33.9* 35.2* 34.1* 31.8*  MCV 96.3 94.4 94.5 95.8  PLT 276 149* 144* 250*   Basic Metabolic Panel: Recent Labs  Lab 07/22/21 0739 07/23/21 0547 07/25/21 0531 07/26/21 0552 07/27/21 0727 07/28/21 0651 07/28/21 1010  NA 142 142 142 146* 147* 144  --   K 3.6 3.6 3.1* 3.4* 3.2* 3.3*  --   CL 105 105 105 108 105 105  --   CO2 31 29 27 26 28 28   --   GLUCOSE 103* 109* 108* 106* 98 95  --   BUN 38* 38* 30* 32* 53* 69*  --   CREATININE 1.24* 1.23* 1.10* 1.36* 2.12* 2.83*  --   CALCIUM 8.7* 9.1 8.9 9.1 8.8* 8.4*  --   MG 1.4* 2.3  --   --   --   --  1.6*  PHOS  --   --   --   --   --   --  4.5    Studies: US RENAL  Result Date: 07/28/2021 CLINICAL DATA:  AK I EXAM: RENAL / URINARY TRACT ULTRASOUND COMPLETE COMPARISON:  Prior sonogram dated 11/23/2013. FINDINGS: Right Kidney: Renal measurements: 8.3 x 3.1 x 3.4 = volume: 45.7 mL. Echogenic renal cortex. No mass or hydronephrosis visualized. Left Kidney: Not visualized. Bladder: Prevoid urinary bladder volume was thigh 72 mL. Other: None. IMPRESSION: 1. Echogenic right renal cortex concerning for medical renal disease. No evidence of nephrolithiasis or hydronephrosis. 2. Left kidney not visualized, which could be secondary to advanced atrophy as seen on prior examination, exam is somewhat limited due to patient's inability to cooperate. 3.  Urinary bladder is unremarkable. Electronically Signed   By: Keane Police D.O.   On: 07/28/2021 13:10    Scheduled Meds:  vitamin C  500 mg Oral  Daily   benzonatate  200 mg Oral TID   calcium-vitamin D  2 tablet Oral Daily   cholecalciferol  1,000 Units Oral Daily   citalopram  10 mg Oral Daily   diltiazem  60 mg  Oral Q6H   famotidine  20 mg Oral Daily   feeding supplement  1 Container Oral TID BM   guaiFENesin-dextromethorphan  10 mL Oral Q6H   heparin injection (subcutaneous)  5,000 Units Subcutaneous Q8H   melatonin  2.5 mg Oral QHS   multivitamin with minerals  1 tablet Oral Daily   polyethylene glycol  17 g Oral Daily   zinc sulfate  220 mg Oral Daily   Continuous Infusions: PRN Meds: acetaminophen, acetaminophen, chlorpheniramine-HYDROcodone, haloperidol lactate, HYDROcodone-acetaminophen, ipratropium-albuterol, lip balm, LORazepam, magnesium hydroxide, metoprolol tartrate, morphine injection, ondansetron **OR** ondansetron (ZOFRAN) IV  Time spent: 35 minutes  Author: Val Riles. MD [facility] 07/28/2021 5:14 PM  To reach On-call, see care teams to locate the attending and reach out to them via [email or web address]. If 7PM-7AM, please contact night-coverage If you still have difficulty reaching the attending provider, please page the [facility] (Director on Call) for [facility] on amion for assistance.

## 2021-07-28 NOTE — Progress Notes (Signed)
OT Cancellation Note  Patient Details Name: [name] [name] MRN: [id number] DOB: 08-20-1929   Cancelled Treatment:    Reason Eval/Treat Not Completed: Patient at procedure or test/ unavailable Pt OTF for [place] at this time. Will f/u at later date/time as able/pt available for OT tx. Thank you.  Gerrianne Scale, [place], OTR/L ascom [phone] 07/28/21, 11:33 AM

## 2021-07-28 NOTE — Progress Notes (Signed)
PT Cancellation Note  Patient Details Name: [name] [name] MRN: [id number] DOB: 09-29-1929   Cancelled Treatment:     PT attempt earlier this date. Pt very confused, lethargic (one eye open) fidgeting, and attempting to climb out of bed at time. She is mumbling and stating that things are in the room which were not.  Daughter and RN were at bedside. Pt is not appropriate to participate at this time. Will return later per daughters request. Ongoing discussion about GOC going forward. Will monitor closely.    Willette Pa 07/28/2021, 11:07 AM

## 2021-07-28 NOTE — Plan of Care (Signed)
Patient confused given Ativan  and Morphine once. Unable to take Cardizem PO. Maintained bellow 100. Monitored closely.    Problem: Education: Goal: Knowledge of General Education information will improve Description: Including pain rating scale, medication(s)/side effects and non-pharmacologic comfort measures Outcome: Progressing   Problem: Health Behavior/Discharge Planning: Goal: Ability to manage health-related needs will improve Outcome: Progressing   Problem: Clinical Measurements: Goal: Ability to maintain clinical measurements within normal limits will improve Outcome: Progressing Goal: Will remain free from infection Outcome: Progressing Goal: Diagnostic test results will improve Outcome: Progressing Goal: Respiratory complications will improve Outcome: Progressing Goal: Cardiovascular complication will be avoided Outcome: Progressing

## 2021-07-29 ENCOUNTER — Inpatient Hospital Stay: Payer: Medicare Other

## 2021-07-29 LAB — CBC
HCT: 35.5 % — ABNORMAL LOW (ref 36.0–46.0)
Hemoglobin: 11.4 g/dL — ABNORMAL LOW (ref 12.0–15.0)
MCH: 30.5 pg (ref 26.0–34.0)
MCHC: 32.1 g/dL (ref 30.0–36.0)
MCV: 94.9 fL (ref 80.0–100.0)
Platelets: 138 10*3/uL — ABNORMAL LOW (ref 150–400)
RBC: 3.74 MIL/uL — ABNORMAL LOW (ref 3.87–5.11)
RDW: 14.2 % (ref 11.5–15.5)
WBC: 15.6 10*3/uL — ABNORMAL HIGH (ref 4.0–10.5)
nRBC: 0 % (ref 0.0–0.2)

## 2021-07-29 LAB — BASIC METABOLIC PANEL
Anion gap: 18 — ABNORMAL HIGH (ref 5–15)
Anion gap: 12 (ref 5–15)
BUN: 83 mg/dL — ABNORMAL HIGH (ref 8–23)
BUN: 85 mg/dL — ABNORMAL HIGH (ref 8–23)
CO2: 25 mmol/L (ref 22–32)
CO2: 24 mmol/L (ref 22–32)
Calcium: 8.9 mg/dL (ref 8.9–10.3)
Calcium: 8.2 mg/dL — ABNORMAL LOW (ref 8.9–10.3)
Chloride: 107 mmol/L (ref 98–111)
Chloride: 113 mmol/L — ABNORMAL HIGH (ref 98–111)
Creatinine, Ser: 3.53 mg/dL — ABNORMAL HIGH (ref 0.44–1.00)
Creatinine, Ser: 3.86 mg/dL — ABNORMAL HIGH (ref 0.44–1.00)
GFR, Estimated: 12 mL/min — ABNORMAL LOW (ref >60)
GFR, Estimated: 11 mL/min — ABNORMAL LOW (ref >60)
Glucose, Bld: 103 mg/dL — ABNORMAL HIGH (ref 70–99)
Glucose, Bld: 154 mg/dL — ABNORMAL HIGH (ref 70–99)
Potassium: 4 mmol/L (ref 3.5–5.1)
Potassium: 3.8 mmol/L (ref 3.5–5.1)
Sodium: 150 mmol/L — ABNORMAL HIGH (ref 135–145)
Sodium: 149 mmol/L — ABNORMAL HIGH (ref 135–145)

## 2021-07-29 LAB — MAGNESIUM: Magnesium: 2.6 mg/dL — ABNORMAL HIGH (ref 1.7–2.4)

## 2021-07-29 LAB — OSMOLALITY: Osmolality: 337 mOsm/kg (ref 275–295)

## 2021-07-29 LAB — PHOSPHORUS: Phosphorus: 5 mg/dL — ABNORMAL HIGH (ref 2.5–4.6)

## 2021-07-29 LAB — PROCALCITONIN: Procalcitonin: 1.17 ng/mL

## 2021-07-29 MED ORDER — SODIUM CHLORIDE 0.9 % IV SOLN
500.0000 mg | INTRAVENOUS | Status: DC
  Administered 2021-07-29 – 2021-08-01 (×4): 500 mg via INTRAVENOUS
  Filled 2021-07-29: qty 5
  Filled 2021-07-29: qty 500
  Filled 2021-07-29 (×3): qty 5

## 2021-07-29 MED ORDER — FLUTICASONE FUROATE-VILANTEROL 200-25 MCG/ACT IN AEPB
1.0000 | INHALATION_SPRAY | Freq: Every day | RESPIRATORY_TRACT | Status: DC
  Filled 2021-07-29: qty 28

## 2021-07-29 MED ORDER — DEXTROSE 5 % IV SOLN: INTRAVENOUS | Status: DC

## 2021-07-29 MED ORDER — HALOPERIDOL LACTATE 5 MG/ML IJ SOLN: 5.0000 mg | Freq: Once | INTRAMUSCULAR | Status: DC

## 2021-07-29 MED ORDER — DEXTROSE-NACL 5-0.45 % IV SOLN: INTRAVENOUS | Status: DC

## 2021-07-29 MED ORDER — SODIUM CHLORIDE 0.9 % IV SOLN
2.0000 g | INTRAVENOUS | Status: DC
  Administered 2021-07-29 – 2021-08-01 (×4): 2 g via INTRAVENOUS
  Filled 2021-07-29: qty 2
  Filled 2021-07-29 (×4): qty 20

## 2021-07-29 MED ORDER — IPRATROPIUM-ALBUTEROL 0.5-2.5 (3) MG/3ML IN SOLN
3.0000 mL | Freq: Four times a day (QID) | RESPIRATORY_TRACT | Status: DC
  Administered 2021-07-29 – 2021-08-01 (×14): 3 mL via RESPIRATORY_TRACT
  Filled 2021-07-29 (×14): qty 3

## 2021-07-29 NOTE — Progress Notes (Signed)
Occupational Therapy Treatment Patient Details Name: [name] [name] MRN: [id number] DOB: 1930/06/07 Today's Date: 07/29/2021   History of present illness [name] [name] is an 53yoF PMH: asthma, COPD, HOH, HTN, comes to ED with acute onset of worsening dyspnea over the last 3 days. COVID-19 + on 07/08/21.  Now off COVID precautions   OT comments  Pt seen for OT tx this date to f/u re: safety with ADLs/ADL mobility. Pt requires increased processing time and extended time to physically complete any aspect of this session including bed mobility. Pt requires MOD A for rolling in bed. Her mentation this date is not safe for further mobilization as she is only able to follow ~20% commands with multimodal cueing. Family, however, report that this is somewhat better than yesterday and RN okays light OT session. Pt engaged in peri care/LB bathing with MOD A and contributes to rolling with use of bed rails with tactile cues. Pt repositioned in bed with MAX A and pt's family educated on stimulating pt to engage in ROM tasks (demonstrated as well) to prevent skin breakdown and atrophy. Pt family with good understanding. Limited session this date, but overall tolerated well. However, significant decline versus baseline and versus even earlier therapy performance this hospital stay. AS a result, OT changes d/c recommendation to STR.    Recommendations for follow up therapy are one component of a multi-disciplinary discharge planning process, led by the attending physician.  Recommendations may be updated based on patient status, additional functional criteria and insurance authorization.    Follow Up Recommendations  Skilled nursing-short term rehab (<3 hours/day)    Assistance Recommended at Discharge Frequent or constant Supervision/Assistance  Patient can return home with the following  A little help with walking and/or transfers;A little help with bathing/dressing/bathroom;Assistance with  cooking/housework;Direct supervision/assist for medications management;Direct supervision/assist for financial management;Assist for transportation;Help with stairs or ramp for entrance   Equipment Recommendations  Other (comment) ([place] oximeter)    Recommendations for Other Services      Precautions / Restrictions Precautions Precautions: Fall Precaution Comments: monitor O2 Restrictions Weight Bearing Restrictions: No       Mobility Bed Mobility Overal bed mobility: Needs Assistance Bed Mobility: Rolling Rolling: Mod assist         General bed mobility comments: Pt still relatively confused, lethargic, unable to sustain attention this. date. Slightly improved mentation/cue following than yesterday, but still requiring moderate mutlimodal cueing to follow ~20% commands including to reach and use bed rails to assist with bed mobility.    Transfers                         Balance                                           ADL either performed or assessed with clinical judgement   ADL Overall ADL's : Needs assistance/impaired             Lower Body Bathing: Moderate assistance;Bed level Lower Body Bathing Details (indicate cue type and reason): rolling in bed                            Extremity/Trunk Assessment              Vision       Perception  Praxis      Cognition Arousal/Alertness: Awake/alert Behavior During Therapy: WFL for tasks assessed/performed Overall Cognitive Status: History of cognitive impairments - at baseline                                 General Comments: HOH, able to follow ~20% commands with increased processing time and tactile cues.        Exercises Other Exercises Other Exercises: OT ed with pt family re: stilmulating and engageing in even just small, bed-level use of extremities/mobility to reduce risk of skin breakdown or atrophy    Shoulder Instructions        General Comments      Pertinent Vitals/ Pain       Pain Assessment Pain Assessment: PAINAD Breathing: occasional labored breathing, short period of hyperventilation Negative Vocalization: none Facial Expression: sad, frightened, frown Body Language: tense, distressed pacing, fidgeting Consolability: no need to console PAINAD Score: 3 Pain Location: R Hip pain Pain Descriptors / Indicators: Aching, Discomfort, Sore Pain Intervention(s): Limited activity within patient's tolerance, Monitored during session, Repositioned  Home Living                                          Prior Functioning/Environment              Frequency  Min 2X/week        Progress Toward Goals  OT Goals(current goals can now be found in the care plan section)  Progress towards OT goals: OT to reassess next treatment  Acute Rehab OT Goals Patient Stated Goal: improve ability to participate like she was a week or so ago OT Goal Formulation: With patient/family Time For Goal Achievement: 08/04/21 Potential to Achieve Goals: [place] Frequency remains appropriate;Discharge plan needs to be updated    Co-evaluation                 AM-PAC OT "6 Clicks" Daily Activity     Outcome Measure   Help from another person eating meals?: None Help from another person taking care of personal grooming?: None Help from another person toileting, which includes using toliet, bedpan, or urinal?: A Little Help from another person bathing (including washing, rinsing, drying)?: A Little Help from another person to put on and taking off regular upper body clothing?: None Help from another person to put on and taking off regular lower body clothing?: A Little 6 Click Score: 21    End of Session Equipment Utilized During Treatment: Oxygen;Rolling walker (2 wheels)  OT Visit Diagnosis: Other abnormalities of gait and mobility (R26.89)   Activity Tolerance Patient tolerated  treatment well   Patient Left in bed;with call bell/phone within reach;with bed alarm set;with nursing/sitter in room   Nurse Communication Mobility status        Time: 7591-6384 OT Time Calculation (min): 16 min  Charges: OT General Charges $OT Visit: 1 Visit OT Treatments $Therapeutic Activity: 8-22 mins  Gerrianne Scale, MS, OTR/L ascom [phone] 07/29/21, 3:31 PM

## 2021-07-29 NOTE — Progress Notes (Signed)
PT Cancellation Note  Patient Details Name: [name] [name] MRN: [id number] DOB: [birth year]   Cancelled Treatment:    Reason Eval/Treat Not Completed:  Nurse requesting to hold PT at this time. PT will continue to follow as appropriate.   Minna Merritts, PT, MPT   Percell Locus 07/29/2021, 3:29 PM

## 2021-07-29 NOTE — Progress Notes (Signed)
Pt has calmed down. SPO2 95-99% on HHFNC and NRB. Spoke with NP Randol Kern at pt bedside. Pt to remain on current settings for now as long as SPO2 is maintaining. Bipap is at bedside with a PRN order.

## 2021-07-29 NOTE — Progress Notes (Signed)
[facility] Progress Note  Patient: [name] [name]    SPQ:[id number]  DOA: 07/15/2021     Date of Service: the patient was seen and examined on 07/29/2021  Chief Complaint  Patient presents with   Shortness of Breath   Brief hospital course: HPI was taken from Dr. Sidney Ace: [name] [name] is a 88 y.o. Caucasian female with medical history significant for asthma/COPD and hypertension, who presented to the ER with acute onset of worsening dyspnea over the last 3 days.  The patient had a positive COVID-19 test on Thursday.  She has not been eating or drinking for the last few days.  She was noted to have mild confusion in the ER.  No loss of taste or smell.  She did have nausea, vomiting and diarrhea per her daughter today.  No chest pain or palpitations.  Per EMS her pulse oximetry was 81% on room air.  She was placed on 10 L with nonrebreather and pulse oximetry was in the mid 90s.  She was also given 1 DuoNeb and 1 albuterol nebulizer on route to the hospital.  She admitted to dry cough without expectoration.  She denies any nausea or vomiting however admits to lower abdominal pain.  No chest pain or palpitations.     ED Course: Upon presentation to the emergency room respiratory rate was 34 and pulse oximetry 87% on room air with a pulse oximetry of 144/59.  Labs revealed a VBG with pH 7.4 and HCO3 31.  BMP was remarkable for hypokalemia hyponatremia and hypochloremia and CBC showed leukopenia of 2.1 and thrombocytopenia with otherwise normal levels.  Influenza antigens came back negative.  COVID-19 PCR came back positive. EKG as reviewed by me : EKG showed sinus tachycardia with a rate of 1 1 with LAD and LVH. Imaging: Chest x-ray showed opacity in the left midlung concerning for pneumonia, emphysema, aortic atherosclerosis and severe elevation of the left hemidiaphragm.  The patient was given 40 mg of IV Lasix, DuoNeb and albuterol.  She will be admitted to a medical telemetry bed for  further evaluation and management.     As per Dr. Rowe Pavy: This patient is a 88 year old female with history of asthma/COPD, hypertension, paroxysmal A. fib who presented here from home with complaints of acute onset of worsening dyspnea, poor oral intake, cough, confusion.  On presentation, she was hypoxic on room air and had to be placed on 10 L of oxygen.  Chest x-ray showed opacity in the left midlung concerning for pneumonia, emphysema.  COVID screen test came out  to be positive.  Hospital course remarkable for worsening confusion, hypoxia.  Overall status was improving, nearing to discharge.  But in the morning of 07/19/2021, she went into A. fib with RVR requiring Cardizem drip.       As per Dr. Jimmye Norman 1/15-1/17/23: Pt has made a turn for the worse. Pt's mental status has declined as well as respiratory status. CXR showed pulmonary edema. Pt was placed on IV lasix. ABG was done also on 07/25/21 which showed O2 of < 31. Pt was initially placed on BiPAP but pt could not tolerate it as a result pt was placed on HFNC. The following day the pt's respiratory status did not improve but pt refused to keep HFNC on and pt was placed on [place]. Comfort care has been discussed w/ the pt's daughter and son but for now they want to continue w/ treatment but still are agreeable to DNR/DNI. Palliative care is also  following, please see their note. Poor prognosis   Please review prior detailed notes.  I started taking care of this patient on 07/28/2021  Assessment and Plan: Principal Problem:   Acute hypoxemic respiratory failure due to COVID-19 [facility]) Active Problems:   Protein-calorie malnutrition, severe   Major depressive disorder, single episode, moderate (HCC)      Acute hypoxic respiratory failure: secondary to COVID-19 infection/COPD exacerbation/malignancy.  Completed COVID19 treatment course.  Continue on bronchodilators and encourage incentive spirometry.  ABG shows pO2 < 31, tried on BiPAP but pt  could not tolerate so pt placed on HFNC.  Pt would not keep on HFNC so pt was placed on [place]  Repeat CXR shows COPD changes & left lower lung mass again seen.   Hold lasix today for elevated Cr. High risk for further deterioration. Poor prognosis.  Acute pulmonary edema: as per CXR. Hold lasix today secondary to worsening Cr. Monitor I/Os 1/19 WBC count elevated, patient was having cough and shortness of breath, possibility of bacterial infection So started ceftriaxone and azithromycin Started Breo Ellipta inhaler and DuoNeb every 6 hourly scheduled   Dehydration secondary to decreased oral intake due to AMS Serum osmolarity 337 Started D5 half NS IV fluid for hydration   Acute metabolic encephalopathy: likely exacerbated from [place]. CT head showed no acute intracranial findings. Poor mental status  1/18 patient was restless, Haldol as needed ordered    Left upper lobe lung mass: CT chest done on 07/15/2021 showed 3.4 x 2.9 cm spiculated mass consistent with malignancy.  Medical oncology and PCCM were consulted.  PCCM planning for bronchoscopy/PET scan as an outpatient.   Hypernatremia: free water deficit is 1.0L. Encourage free water intake     PAF: continue on CCB. Not on anticoagulation secondary to advanced age. Cardio recs apprec   Thrombocytopenia: labile, etiology unclear    Hx of COPD: continue treatment as stated above.     AKI on CKDII: Cr is trending up again today.  Hold lasix  US renal negative for any obstruction Urine retention, Foley catheter was inserted on 1/18 Started IV fluid for hydration Creatinine 3.53 today   Hypokalemia: KCl repleated Hypomagnesemia, mag repleted.  HTN: continue on CCB   Vitamin B12 deficiency, vitamin B12 level 137, target >400, started vitamin B12 1000 mcg IM injection daily followed by oral supplement on discharge.  Follow with PCP to repeat vitamin B12 level after 3 months.  Failure to thrive: secondary to all below. Palliative  care is following and recs apprec.  Severe protein calorie malnutrition: continue on nutritional supplements  Body mass index is 17.59 kg/m.  Nutrition Problem: Severe Malnutrition Etiology: chronic illness (COPD) Interventions:   Debility: PT/OT initially recs HH     Diet: Dysphagia 1 nectar thick liquids DVT Prophylaxis: Subcutaneous Heparin    Advance goals of care discussion: DNR  Family Communication: family was present at bedside, at the time of interview.  The pt provided permission to discuss medical plan with the family. Opportunity was given to ask question and all questions were answered satisfactorily.   Disposition:  Pt is from Home, admitted with respiratory failure due to COVID viral pneumonia, still has respiratory failure, AMS, which precludes a safe discharge. Discharge to TBD PT and OT eval pending, still clinically not stable to discharge, may require 3 to 5 days more..  Subjective: No significant events overnight, still in the morning patient was confused and disoriented, AO x0.  Patient's family was at bedside, above management plan discussed.  Physical Exam: General: Confused, not oriented to time, place, and person.  Appear in moderate distress, affect anxious Eyes: Mostly closed, briefly opened, AMS  ENT: Oral Mucosa dry  Neck: no JVD,  Cardiovascular: S1 and S2 Present, no Murmur,  Respiratory: increased respiratory effort, Bilateral Air entry equal and Decreased, mild Crackles, no wheezes Abdomen: Bowel Sound present, Soft and no tenderness,  Skin: no rashes Extremities: no Pedal edema, no calf tenderness Neurologic: without any new focal findings Gait not checked due to patient safety concerns  Vitals:   07/29/21 1200 07/29/21 1300 07/29/21 1416 07/29/21 1520  BP: (!) 115/54   103/60  Pulse: 84 80  82  Resp: 18 (!) 26  (!) 24  Temp: 97.8 F (36.6 C)   97.8 F (36.6 C)  TempSrc: Axillary   Axillary  SpO2: 93% 98% 90% 91%  Weight:       Height:        Intake/Output Summary (Last 24 hours) at 07/29/2021 1653 Last data filed at 07/29/2021 1523 Gross per 24 hour  Intake 240 ml  Output 850 ml  Net -610 ml   Filed Weights   07/12/2021 1706  Weight: 45 kg    Data Reviewed: I have personally reviewed and interpreted daily labs, tele strips, imagings as discussed above. I reviewed all nursing notes, pharmacy notes, vitals, pertinent old records I have discussed plan of care as described above with RN and patient/family.  CBC: Recent Labs  Lab 07/23/21 0547 07/26/21 0552 07/27/21 0727 07/28/21 0651 07/29/21 0805  WBC 10.8* 14.1* 9.5 8.6 15.6*  HGB 11.1* 11.6* 11.1* 10.4* 11.4*  HCT 33.9* 35.2* 34.1* 31.8* 35.5*  MCV 96.3 94.4 94.5 95.8 94.9  PLT 276 149* 144* 119* 010*   Basic Metabolic Panel: Recent Labs  Lab 07/23/21 0547 07/25/21 0531 07/26/21 0552 07/27/21 0727 07/28/21 0651 07/28/21 1010 07/29/21 0805  NA 142 142 146* 147* 144  --  150*  K 3.6 3.1* 3.4* 3.2* 3.3*  --  4.0  CL 105 105 108 105 105  --  107  CO2 29 27 26 28 28   --  25  GLUCOSE 109* 108* 106* 98 95  --  103*  BUN 38* 30* 32* 53* 69*  --  83*  CREATININE 1.23* 1.10* 1.36* 2.12* 2.83*  --  3.53*  CALCIUM 9.1 8.9 9.1 8.8* 8.4*  --  8.9  MG 2.3  --   --   --   --  1.6* 2.6*  PHOS  --   --   --   --   --  4.5 5.0*    Studies: No results found.  Scheduled Meds:  vitamin C  500 mg Oral Daily   benzonatate  200 mg Oral TID   calcium-vitamin D  2 tablet Oral Daily   cholecalciferol  1,000 Units Oral Daily   citalopram  10 mg Oral Daily   cyanocobalamin  1,000 mcg Intramuscular Daily   Followed by   Derrill Memo ON 08/04/2021] vitamin B-12  500 mcg Oral Daily   diltiazem  60 mg Oral Q6H   famotidine  20 mg Oral Daily   feeding supplement (NEPRO CARB STEADY)  237 mL Oral TID BM   fluticasone furoate-vilanterol  1 puff Inhalation Daily   guaiFENesin-dextromethorphan  10 mL Oral Q6H   heparin injection (subcutaneous)  5,000 Units  Subcutaneous Q8H   ipratropium-albuterol  3 mL Nebulization Q6H   melatonin  2.5 mg Oral QHS   multivitamin with minerals  1  tablet Oral Daily   polyethylene glycol  17 g Oral Daily   zinc sulfate  220 mg Oral Daily   Continuous Infusions:  azithromycin 500 mg (07/29/21 1140)   cefTRIAXone (ROCEPHIN)  IV 2 g (07/29/21 1020)   dextrose 5 % and 0.45% NaCl 75 mL/hr at 07/29/21 1013   PRN Meds: acetaminophen, acetaminophen, chlorpheniramine-HYDROcodone, haloperidol lactate, HYDROcodone-acetaminophen, lip balm, magnesium hydroxide, metoprolol tartrate, morphine injection, ondansetron **OR** ondansetron (ZOFRAN) IV  Time spent: 35 minutes  Author: Val Riles. MD [facility] 07/29/2021 4:53 PM  To reach On-call, see care teams to locate the attending and reach out to them via [email or web address]. If 7PM-7AM, please contact night-coverage If you still have difficulty reaching the attending provider, please page the [facility] (Director on Call) for [facility] on amion for assistance.

## 2021-07-29 NOTE — Progress Notes (Signed)
Called by RN for pt desats. Found pt to be in low 80's with NRB. Pt agitated and confused upon arrival. Placed pt on [place] @ 100% with NRB mask. Pt has mittens on RN at bedside.

## 2021-07-29 NOTE — Progress Notes (Signed)
Palliative Care Progress Note, Assessment & Plan   Patient Name: [name] [name]       Date: 07/29/2021 DOB: 1929-12-28  Age: 88 y.o. MRN#: [id number] Attending Physician: Val Riles, MD Primary Care Physician: [name] [name]., MD Admit Date: 07/12/2021  Reason for Consultation/Follow-up: Establishing goals of care  Subjective: Patient is lying in bed and does not acknowledge my presence.  Mitts are in place.  Patient attempts to remove mittens.  When daughter presses her patient opens her eyes and says that she is awake and not in pain.  Patient mumbles incoherent words and reaches for things not present in the room.  HPI: 88 y.o. female  with past medical history of COPD, asthma, HTN, paroxysmal A. fib admitted on 07/28/2021 with worsening dyspnea, poor oral intake, cough and confusion.  Patient found to be COVID-positive.  Family endorses first positive home test occurred 08/08/2021.  1/5, CT of chest revealed a spiculated mass in the left upper lobe.  1/5, ultrasound of lower extremities revealed no evidence of a DVT.  1/6, CT of head revealed no intracranial abnormalities.   1/9, Afib with RVR - cardizem drip.    1/10 Covid precautions discontinued. Pt still requiring oxygen and waxing/waning participation with PT/OT.    1/18 - pt continues to be confused, somnolent, and waxing/waning mental status.Foley placed for urinary retention.  1/19 - IVF and IV abx started.  Summary of counseling/coordination of care: After reviewing the patient's chart and assessing the patient, I met with the patient and her son and daughter at bedside.  Patient continues to appear more dusky in color, weaker, and all looks more unsettled to me than yesterday.  Daughter shares the patient is in a dreamlike state  and just wishes the dreams were not nightmares.  Daughter and son are hopeful that IV fluids and antibiotics will help "bring her out of this dreamlike state".  Therapeutic silence and active listening provided for daughter and son to share their concerns regarding the patient's health status.  Both remain hopeful that they will be able to find a medication that is causing the patient's confusion.  I again reiterated that her age, recovering from [place], and kidney issues could be contributing to patient's current confusion and delirium.  Palliative medicine team will continue to follow patient throughout her hospitalization.  We will monitor peripherally and intervene if family asked, if medical team would like for [place] to intervene again, or if patient status declines.  Code Status: DNR  Prognosis: Unable to determine  Discharge Planning: To Be Determined  Recommendations/Plan: Treat the treatable  Care plan was discussed with patient, patient's son and daughter, RN Vickie  Physical Exam Vitals and nursing note reviewed.  Constitutional:      Appearance: She is ill-appearing.  HENT:     Head: Normocephalic and atraumatic.  Cardiovascular:     Rate and Rhythm: Normal rate.  Pulmonary:     Effort: Pulmonary effort is normal.     Comments: [place] in place Abdominal:     Palpations: Abdomen is soft.  Musculoskeletal:     Comments: Generalized weakness  Skin:    General: Skin is warm and dry.  Neurological:  Mental Status: She is disoriented.  Psychiatric:        Mood and Affect: Mood is not anxious.        Behavior: Behavior is agitated.            Palliative Assessment/Data: 30%    Total Time 15 minutes  Greater than 50%  of this time was spent counseling and coordinating care related to the above assessment and plan.  Thank you for allowing the Palliative Medicine Team to assist in the care of this patient.  [place] Ilsa Iha, FNP-BC Palliative Medicine  Team Team Phone # [phone]

## 2021-07-30 LAB — BASIC METABOLIC PANEL
Anion gap: 12 (ref 5–15)
BUN: 86 mg/dL — ABNORMAL HIGH (ref 8–23)
CO2: 26 mmol/L (ref 22–32)
Calcium: 8.2 mg/dL — ABNORMAL LOW (ref 8.9–10.3)
Chloride: 110 mmol/L (ref 98–111)
Creatinine, Ser: 4.07 mg/dL — ABNORMAL HIGH (ref 0.44–1.00)
GFR, Estimated: 10 mL/min — ABNORMAL LOW (ref >60)
Glucose, Bld: 161 mg/dL — ABNORMAL HIGH (ref 70–99)
Potassium: 4 mmol/L (ref 3.5–5.1)
Sodium: 148 mmol/L — ABNORMAL HIGH (ref 135–145)

## 2021-07-30 LAB — MAGNESIUM: Magnesium: 2.2 mg/dL (ref 1.7–2.4)

## 2021-07-30 LAB — CBC
HCT: 30.2 % — ABNORMAL LOW (ref 36.0–46.0)
Hemoglobin: 9.7 g/dL — ABNORMAL LOW (ref 12.0–15.0)
MCH: 30.6 pg (ref 26.0–34.0)
MCHC: 32.1 g/dL (ref 30.0–36.0)
MCV: 95.3 fL (ref 80.0–100.0)
Platelets: 121 10*3/uL — ABNORMAL LOW (ref 150–400)
RBC: 3.17 MIL/uL — ABNORMAL LOW (ref 3.87–5.11)
RDW: 14.6 % (ref 11.5–15.5)
WBC: 15.8 10*3/uL — ABNORMAL HIGH (ref 4.0–10.5)
nRBC: 0 % (ref 0.0–0.2)

## 2021-07-30 LAB — PHOSPHORUS: Phosphorus: 5.2 mg/dL — ABNORMAL HIGH (ref 2.5–4.6)

## 2021-07-30 MED ORDER — SODIUM CHLORIDE 0.9 % IV BOLUS: 500.0000 mL | Freq: Once | INTRAVENOUS | Status: DC

## 2021-07-30 MED ORDER — CHLORHEXIDINE GLUCONATE CLOTH 2 % EX PADS
6.0000 | MEDICATED_PAD | Freq: Every day | CUTANEOUS | Status: DC
  Administered 2021-07-31 – 2021-08-01 (×2): 6 via TOPICAL

## 2021-07-30 MED ORDER — SODIUM CHLORIDE 0.9 % IV BOLUS
250.0000 mL | Freq: Once | INTRAVENOUS | Status: AC
  Administered 2021-07-30: 250 mL via INTRAVENOUS

## 2021-07-30 NOTE — Progress Notes (Signed)
@  approx. 2130, B. Randol Kern, on-call for attending, text-paged regarding pt's continued inability to take PO medications and to inquire if any PO meds should be converted to alternative routes. Page promptly returned and no orders received to change the route of any current medications.

## 2021-07-30 NOTE — Progress Notes (Signed)
[facility] Progress Note  Patient: [name] [name]    XBM:[id number]  DOA: 07/23/2021     Date of Service: the patient was seen and examined on 07/30/2021  Chief Complaint  Patient presents with   Shortness of Breath   Brief hospital course: HPI was taken from Dr. Sidney [name]: [name] [name] is a 88 y.o. Caucasian female with medical history significant for asthma/COPD and hypertension, who presented to the ER with acute onset of worsening dyspnea over the last 3 days.  The patient had a positive COVID-19 test on Thursday.  She has not been eating or drinking for the last few days.  She was noted to have mild confusion in the ER.  No loss of taste or smell.  She did have nausea, vomiting and diarrhea per her daughter today.  No chest pain or palpitations.  Per EMS her pulse oximetry was 81% on room air.  She was placed on 10 L with nonrebreather and pulse oximetry was in the mid 90s.  She was also given 1 DuoNeb and 1 albuterol nebulizer on route to the hospital.  She admitted to dry cough without expectoration.  She denies any nausea or vomiting however admits to lower abdominal pain.  No chest pain or palpitations.     ED Course: Upon presentation to the emergency room respiratory rate was 34 and pulse oximetry 87% on room air with a pulse oximetry of 144/59.  Labs revealed a VBG with pH 7.4 and HCO3 31.  BMP was remarkable for hypokalemia hyponatremia and hypochloremia and CBC showed leukopenia of 2.1 and thrombocytopenia with otherwise normal levels.  Influenza antigens came back negative.  COVID-19 PCR came back positive. EKG as reviewed by me : EKG showed sinus tachycardia with a rate of 1 1 with LAD and LVH. Imaging: Chest x-ray showed opacity in the left midlung concerning for pneumonia, emphysema, aortic atherosclerosis and severe elevation of the left hemidiaphragm.  The patient was given 40 mg of IV Lasix, DuoNeb and albuterol.  She will be admitted to a medical telemetry bed for  further evaluation and management.     As per Dr. Rowe [name]: This patient is a 88 year old female with history of asthma/COPD, hypertension, paroxysmal A. fib who presented here from home with complaints of acute onset of worsening dyspnea, poor oral intake, cough, confusion.  On presentation, she was hypoxic on room air and had to be placed on 10 L of oxygen.  Chest x-ray showed opacity in the left midlung concerning for pneumonia, emphysema.  COVID screen test came out  to be positive.  Hospital course remarkable for worsening confusion, hypoxia.  Overall status was improving, nearing to discharge.  But in the morning of 07/19/2021, she went into A. fib with RVR requiring Cardizem drip.       As per Dr. Jimmye [name] 1/15-1/17/23: Pt has made a turn for the worse. Pt's mental status has declined as well as respiratory status. CXR showed pulmonary edema. Pt was placed on IV lasix. ABG was done also on 07/25/21 which showed O2 of < 31. Pt was initially placed on BiPAP but pt could not tolerate it as a result pt was placed on HFNC. The following day the pt's respiratory status did not improve but pt refused to keep HFNC on and pt was placed on [place]. Comfort care has been discussed w/ the pt's daughter and son but for now they want to continue w/ treatment but still are agreeable to DNR/DNI. Palliative care is also  following, please see their note. Poor prognosis   Please review prior detailed notes.  I started taking care of this patient on 07/28/2021  Assessment and Plan: Principal Problem:   Acute hypoxemic respiratory failure due to COVID-19 [facility]) Active Problems:   Protein-calorie malnutrition, severe   Major depressive disorder, single episode, moderate (HCC)      Acute hypoxic respiratory failure: secondary to COVID-19 infection/COPD exacerbation/malignancy.  Completed COVID19 treatment course.  Continue on bronchodilators and encourage incentive spirometry.  ABG shows pO2 < 31, tried on BiPAP but pt  could not tolerate so pt placed on HFNC.  Pt would not keep on HFNC so pt was placed on [place]  Repeat CXR shows COPD changes & left lower lung mass again seen.   Hold lasix today for elevated Cr. High risk for further deterioration. Poor prognosis.  Acute pulmonary edema: as per CXR. Hold lasix today secondary to worsening Cr. Monitor I/Os 1/19 WBC count elevated, patient was having cough and shortness of breath, possibility of bacterial infection So started ceftriaxone and azithromycin Started Breo Ellipta inhaler and DuoNeb every 6 hourly scheduled   Dehydration secondary to decreased oral intake due to AMS Serum osmolarity 337 Started D5 IV fluid for hydration 1/20 blood pressure was low, 250 ml NS bolus was given  Acute metabolic encephalopathy: likely exacerbated from [place]. CT head showed no acute intracranial findings. Poor mental status  1/18 patient was restless, Haldol as needed ordered    Left upper lobe lung mass: CT chest done on 07/15/2021 showed 3.4 x 2.9 cm spiculated mass consistent with malignancy.  Medical oncology and PCCM were consulted.  PCCM planning for bronchoscopy/PET scan as an outpatient.   Hypernatremia: free water deficit is 1.0L. Encourage free water intake     PAF: continue on CCB. Not on anticoagulation secondary to advanced age. Cardio recs apprec   Thrombocytopenia: labile, etiology unclear    Hx of COPD: continue treatment as stated above.     AKI on CKDII: Cr is trending up again today.  Hold lasix  US renal negative for any obstruction Urine retention, Foley catheter was inserted on 1/18 Started IV fluid for hydration Creatinine 3.53 --4.07 today   Hypokalemia: KCl repleated Hypomagnesemia, mag repleted.  HTN: continue on CCB   Vitamin B12 deficiency, vitamin B12 level 137, target >400, started vitamin B12 1000 mcg IM injection daily followed by oral supplement on discharge.  Follow with PCP to repeat vitamin B12 level after 3  months.  Failure to thrive: secondary to all below. Palliative care is following and recs apprec.  Severe protein calorie malnutrition: continue on nutritional supplements  Body mass index is 17.59 kg/m.  Nutrition Problem: Severe Malnutrition Etiology: chronic illness (COPD) Interventions:   Debility: PT/OT   1/20 patient's condition is gradually declining, very poor prognosis.  Goals of care discussed with patient's family at bedside.  Patient's condition can return at any time, patient is DNR/DNI, we will continue current treatment and if patient does not improve over the weekend then possible need hospice placement.     Diet: Keep n.p.o. currently AMS, risk of aspiration DVT Prophylaxis: Subcutaneous Heparin    Advance goals of care discussion: DNR  Family Communication: family was present at bedside, at the time of interview.  The pt provided permission to discuss medical plan with the family. Opportunity was given to ask question and all questions were answered satisfactorily.   Disposition:  Pt is from Home, admitted with respiratory failure due to COVID viral  pneumonia, still has respiratory failure, AMS, which precludes a safe discharge. Discharge to TBD PT and OT eval pending, still clinically not stable to discharge, may require 3 to 5 days more.. If no improvement over the weekend, plan is for hospice placement on Monday.  Subjective: No significant events overnight, still in the morning patient was confused and disoriented, AO x0.  Patient's family was at bedside, above management plan discussed.   Physical Exam: General: Obtunded, not oriented to time, place, and person.  Appear in moderate distress, affect anxious Eyes: Mostly closed, briefly opened, AMS  ENT: Oral Mucosa dry  Neck: no JVD,  Cardiovascular: S1 and S2 Present, no Murmur,  Respiratory: increased respiratory effort, Bilateral Air entry equal and Decreased, mild Crackles, no wheezes Abdomen: Bowel  Sound present, Soft and no tenderness,  Skin: no rashes Extremities: no Pedal edema, no calf tenderness Neurologic: without any new focal findings Gait not checked due to patient safety concerns  Vitals:   07/30/21 0722 07/30/21 0729 07/30/21 0800 07/30/21 1339  BP:  (!) 111/56    Pulse:  83    Resp:  20    Temp:  98.9 F (37.2 C)    TempSrc:  Axillary    SpO2: 99% 93% 93% 97%  Weight:      Height:        Intake/Output Summary (Last 24 hours) at 07/30/2021 1703 Last data filed at 07/30/2021 1300 Gross per 24 hour  Intake 997.01 ml  Output --  Net 997.01 ml   Filed Weights   07/21/2021 1706  Weight: 45 kg    Data Reviewed: I have personally reviewed and interpreted daily labs, tele strips, imagings as discussed above. I reviewed all nursing notes, pharmacy notes, vitals, pertinent old records I have discussed plan of care as described above with RN and patient/family.  CBC: Recent Labs  Lab 07/26/21 0552 07/27/21 0727 07/28/21 0651 07/29/21 0805 07/30/21 0755  WBC 14.1* 9.5 8.6 15.6* 15.8*  HGB 11.6* 11.1* 10.4* 11.4* 9.7*  HCT 35.2* 34.1* 31.8* 35.5* 30.2*  MCV 94.4 94.5 95.8 94.9 95.3  PLT 149* 144* 119* 138* 672*   Basic Metabolic Panel: Recent Labs  Lab 07/27/21 0727 07/28/21 0651 07/28/21 1010 07/29/21 0805 07/29/21 2211 07/30/21 0755  NA 147* 144  --  150* 149* 148*  K 3.2* 3.3*  --  4.0 3.8 4.0  CL 105 105  --  107 113* 110  CO2 28 28  --  25 24 26   GLUCOSE 98 95  --  103* 154* 161*  BUN 53* 69*  --  83* 85* 86*  CREATININE 2.12* 2.83*  --  3.53* 3.86* 4.07*  CALCIUM 8.8* 8.4*  --  8.9 8.2* 8.2*  MG  --   --  1.6* 2.6*  --  2.2  PHOS  --   --  4.5 5.0*  --  5.2*    Studies: DG Chest Port 1 View  Result Date: 07/29/2021 CLINICAL DATA:  Hypoxia EXAM: PORTABLE CHEST 1 VIEW COMPARISON:  07/27/2021 FINDINGS: Cardiac shadow is within normal limits. Aortic calcifications are again seen. Pleural base mass lesion is again noted on the left stable in  appearance from the prior exam. No pneumothorax is seen. Small pleural effusions are noted. Right basilar airspace opacity is noted new from the prior exam. No bony abnormality is seen. IMPRESSION: Stable left pleural base mass lesion laterally. Increasing right basilar atelectasis. Small effusions. Electronically Signed   By: Linus Mako.D.  On: 07/29/2021 22:00    Scheduled Meds:  benzonatate  200 mg Oral TID   calcium-vitamin D  2 tablet Oral Daily   cholecalciferol  1,000 Units Oral Daily   citalopram  10 mg Oral Daily   cyanocobalamin  1,000 mcg Intramuscular Daily   Followed by   Derrill Memo ON 08/04/2021] vitamin B-12  500 mcg Oral Daily   diltiazem  60 mg Oral Q6H   famotidine  20 mg Oral Daily   feeding supplement (NEPRO CARB STEADY)  237 mL Oral TID BM   fluticasone furoate-vilanterol  1 puff Inhalation Daily   guaiFENesin-dextromethorphan  10 mL Oral Q6H   heparin injection (subcutaneous)  5,000 Units Subcutaneous Q8H   ipratropium-albuterol  3 mL Nebulization Q6H   melatonin  2.5 mg Oral QHS   multivitamin with minerals  1 tablet Oral Daily   polyethylene glycol  17 g Oral Daily   Continuous Infusions:  azithromycin 500 mg (07/30/21 1108)   cefTRIAXone (ROCEPHIN)  IV 2 g (07/30/21 1018)   dextrose 75 mL/hr at 07/30/21 1232   PRN Meds: acetaminophen, acetaminophen, chlorpheniramine-HYDROcodone, HYDROcodone-acetaminophen, lip balm, magnesium hydroxide, metoprolol tartrate, morphine injection, ondansetron **OR** ondansetron (ZOFRAN) IV  Time spent: 35 minutes  Author: Val Riles. MD [facility] 07/30/2021 5:03 PM  To reach On-call, see care teams to locate the attending and reach out to them via [email or web address]. If 7PM-7AM, please contact night-coverage If you still have difficulty reaching the attending provider, please page the [facility] (Director on Call) for [facility] on amion for assistance.

## 2021-07-30 NOTE — Progress Notes (Addendum)
Please note PAINAD score. Patient appears uncomfortable; calling out and moaning frequently. Patient is inconsolable at this time. Patient turns head to voice but does not give appropriate verbal response nor does she open her eyes. Per EPIC chat with Dr. Dwyane Dee, will hold PO medications at this time.   07/30/21 0729  Vitals  Temp 98.9 F (37.2 C)  Temp Source Axillary  BP (!) 111/56  MAP (mmHg) 73  BP Location Right Arm  BP Method Automatic  Patient Position (if appropriate) Lying  Pulse Rate 83  Pulse Rate Source Monitor  ECG Heart Rate 81  Resp 20  Level of Consciousness  Level of Consciousness Responds to Voice  MEWS COLOR  MEWS Score Color Green  Oxygen Therapy  SpO2 93 %  O2 Device HFNC  PAINAD (Pain Assessment in Advanced Dementia)  Breathing 2  Negative Vocalization 2  Facial Expression 1  Body Language 2  Consolability 2  PAINAD Score 9  MEWS Score  MEWS Temp 0  MEWS Systolic 0  MEWS Pulse 0  MEWS RR 0  MEWS LOC 1  MEWS Score 1

## 2021-07-30 NOTE — Progress Notes (Signed)
Cross Cover Significant increase in oxygen requirement to maintain sats.  BIPAP p;anned but not tolerated. Sats improved after treatment for agitation on HHFNC and NRB Daughter informed of change over phone and severity of condition

## 2021-07-30 NOTE — Progress Notes (Addendum)
SLP Cancellation Note  Patient Details Name: [name] [name] MRN: [id number] DOB: Feb 22, 1930   Cancelled treatment:       Reason Eval/Treat Not Completed:  (chart reviewed; met w/ family and discussed w/ Palliative Care NP).  Pt continues to present w/ declined medical and Cognitive status'. She again requires HFNC O2 support currently; noted elevated WBC. Per chart notes this morning, she presented as uncomfortable to NSG; calling out and moaning frequently. She turns head to voice but does not give appropriate verbal response nor does she open her eyes. Per MD/NSG, PO medications on Hold at this time.  Pt is not currently appropriate for consideration of any diet upgrade or skilled tx d/t her declined State and need for increased o2 support. She is at [place] risk for aspiration of any oral intake. She is currently on a Dysphagia level 1 (puree) diet w/ Nectar liquids when appropriate to take oral intake -- per NSG assessment/determination. Precautions posted in room. This was discussed w/ pt's Son and Dtr in room today, and w/ Palliative Care, NSG. Recommended to family to continue oral care for comfort vs focus on po's. Further skilled ST services are not recommended currently. ST services to sign off, and MD to reconsult if any new needs arise during this admit.  Pt is being followed by Palliative Care w/ discussion of Comfort Care ongoing w/ MD/Palliative Care.        Orinda Kenner, MS, CCC-SLP Speech Language Pathologist Rehab Services; [place] [phone] (ascom) [name] [name] 07/30/2021, 5:22 PM

## 2021-07-30 NOTE — Progress Notes (Signed)
PT Cancellation Note  Patient Details Name: [name] [name] MRN: [id number] DOB: Oct 03, 1929   Cancelled Treatment:    Reason Eval/Treat Not Completed: Other (comment) Nurse reports pt is not appropriate for PT intervention today. Will re-attempt at later date when pt is appropriate.  Lavone Nian, PT, DPT 07/30/21, 12:47 PM   [name] 07/30/2021, 12:46 PM

## 2021-07-31 LAB — CBC
HCT: 30 % — ABNORMAL LOW (ref 36.0–46.0)
Hemoglobin: 9.3 g/dL — ABNORMAL LOW (ref 12.0–15.0)
MCH: 30.6 pg (ref 26.0–34.0)
MCHC: 31 g/dL (ref 30.0–36.0)
MCV: 98.7 fL (ref 80.0–100.0)
Platelets: 99 10*3/uL — ABNORMAL LOW (ref 150–400)
RBC: 3.04 MIL/uL — ABNORMAL LOW (ref 3.87–5.11)
RDW: 14.6 % (ref 11.5–15.5)
WBC: 10.9 10*3/uL — ABNORMAL HIGH (ref 4.0–10.5)
nRBC: 0 % (ref 0.0–0.2)

## 2021-07-31 LAB — MAGNESIUM: Magnesium: 2.2 mg/dL (ref 1.7–2.4)

## 2021-07-31 LAB — BASIC METABOLIC PANEL
Anion gap: 14 (ref 5–15)
BUN: 83 mg/dL — ABNORMAL HIGH (ref 8–23)
CO2: 26 mmol/L (ref 22–32)
Calcium: 8 mg/dL — ABNORMAL LOW (ref 8.9–10.3)
Chloride: 102 mmol/L (ref 98–111)
Creatinine, Ser: 3.95 mg/dL — ABNORMAL HIGH (ref 0.44–1.00)
GFR, Estimated: 10 mL/min — ABNORMAL LOW (ref >60)
Glucose, Bld: 109 mg/dL — ABNORMAL HIGH (ref 70–99)
Potassium: 3.6 mmol/L (ref 3.5–5.1)
Sodium: 142 mmol/L (ref 135–145)

## 2021-07-31 LAB — PHOSPHORUS: Phosphorus: 4.1 mg/dL (ref 2.5–4.6)

## 2021-07-31 MED ORDER — SODIUM CHLORIDE 0.9 % IV BOLUS
500.0000 mL | Freq: Once | INTRAVENOUS | Status: AC
  Administered 2021-07-31: 500 mL via INTRAVENOUS

## 2021-07-31 MED ORDER — LORAZEPAM 2 MG/ML IJ SOLN
2.0000 mg | Freq: Four times a day (QID) | INTRAMUSCULAR | Status: DC | PRN
  Administered 2021-07-31 – 2021-08-02 (×3): 2 mg via INTRAVENOUS
  Filled 2021-07-31 (×3): qty 1

## 2021-07-31 MED ORDER — DILTIAZEM HCL 25 MG/5ML IV SOLN
10.0000 mg | Freq: Four times a day (QID) | INTRAVENOUS | Status: DC | PRN
  Administered 2021-07-31 – 2021-08-01 (×2): 10 mg via INTRAVENOUS
  Filled 2021-07-31 (×3): qty 5

## 2021-07-31 MED ORDER — DILTIAZEM HCL 25 MG/5ML IV SOLN
5.0000 mg | Freq: Once | INTRAVENOUS | Status: AC
  Administered 2021-07-31: 5 mg via INTRAVENOUS
  Filled 2021-07-31: qty 5

## 2021-07-31 MED ORDER — LORAZEPAM 2 MG/ML IJ SOLN
INTRAMUSCULAR | Status: AC
  Filled 2021-07-31: qty 1

## 2021-07-31 NOTE — Progress Notes (Signed)
Pt continues to have desats into 80's. FIO2 increased to maximum of 96% with O2 sat of 92.

## 2021-07-31 NOTE — Progress Notes (Signed)
[facility] Progress Note  Patient: [name] [name]    VPX:[id number]  DOA: 07/14/2021     Date of Service: the patient was seen and examined on 07/31/2021  Chief Complaint  Patient presents with   Shortness of Breath   Brief hospital course: HPI was taken from Dr. Sidney [name]: [name] [name] is a 88 y.o. Caucasian female with medical history significant for asthma/COPD and hypertension, who presented to the ER with acute onset of worsening dyspnea over the last 3 days.  The patient had a positive COVID-19 test on Thursday.  She has not been eating or drinking for the last few days.  She was noted to have mild confusion in the ER.  No loss of taste or smell.  She did have nausea, vomiting and diarrhea per her daughter today.  No chest pain or palpitations.  Per EMS her pulse oximetry was 81% on room air.  She was placed on 10 L with nonrebreather and pulse oximetry was in the mid 90s.  She was also given 1 DuoNeb and 1 albuterol nebulizer on route to the hospital.  She admitted to dry cough without expectoration.  She denies any nausea or vomiting however admits to lower abdominal pain.  No chest pain or palpitations.     ED Course: Upon presentation to the emergency room respiratory rate was 34 and pulse oximetry 87% on room air with a pulse oximetry of 144/59.  Labs revealed a VBG with pH 7.4 and HCO3 31.  BMP was remarkable for hypokalemia hyponatremia and hypochloremia and CBC showed leukopenia of 2.1 and thrombocytopenia with otherwise normal levels.  Influenza antigens came back negative.  COVID-19 PCR came back positive. EKG as reviewed by me : EKG showed sinus tachycardia with a rate of 1 1 with LAD and LVH. Imaging: Chest x-ray showed opacity in the left midlung concerning for pneumonia, emphysema, aortic atherosclerosis and severe elevation of the left hemidiaphragm.  The patient was given 40 mg of IV Lasix, DuoNeb and albuterol.  She will be admitted to a medical telemetry bed for  further evaluation and management.     As per Dr. Rowe [name]: This patient is a 88 year old female with history of asthma/COPD, hypertension, paroxysmal A. fib who presented here from home with complaints of acute onset of worsening dyspnea, poor oral intake, cough, confusion.  On presentation, she was hypoxic on room air and had to be placed on 10 L of oxygen.  Chest x-ray showed opacity in the left midlung concerning for pneumonia, emphysema.  COVID screen test came out  to be positive.  Hospital course remarkable for worsening confusion, hypoxia.  Overall status was improving, nearing to discharge.  But in the morning of 07/19/2021, she went into A. fib with RVR requiring Cardizem drip.       As per Dr. Jimmye [name] 1/15-1/17/23: Pt has made a turn for the worse. Pt's mental status has declined as well as respiratory status. CXR showed pulmonary edema. Pt was placed on IV lasix. ABG was done also on 07/25/21 which showed O2 of < 31. Pt was initially placed on BiPAP but pt could not tolerate it as a result pt was placed on HFNC. The following day the pt's respiratory status did not improve but pt refused to keep HFNC on and pt was placed on [place]. Comfort care has been discussed w/ the pt's daughter and son but for now they want to continue w/ treatment but still are agreeable to DNR/DNI. Palliative care is also  following, please see their note. Poor prognosis   Please review prior detailed notes.  I started taking care of this patient on 07/28/2021  Assessment and Plan: Principal Problem:   Acute hypoxemic respiratory failure due to COVID-19 [facility]) Active Problems:   Protein-calorie malnutrition, severe   Major depressive disorder, single episode, moderate (HCC)      Acute hypoxic respiratory failure: secondary to COVID-19 infection/COPD exacerbation/malignancy.  Completed COVID19 treatment course.  Continue on bronchodilators and encourage incentive spirometry.  ABG shows pO2 < 31, tried on BiPAP but pt  could not tolerate so pt placed on HFNC.  Pt would not keep on HFNC so pt was placed on [place]  Repeat CXR shows COPD changes & left lower lung mass again seen.   Hold lasix today for elevated Cr. High risk for further deterioration. Poor prognosis.  Acute pulmonary edema: as per CXR. Hold lasix today secondary to worsening Cr. Monitor I/Os 1/19 WBC count elevated, patient was having cough and shortness of breath, possibility of bacterial infection So started ceftriaxone and azithromycin Started Breo Ellipta inhaler and DuoNeb every 6 hourly scheduled   Dehydration secondary to decreased oral intake due to AMS Serum osmolarity 337 Started D5 IV fluid for hydration 1/20 blood pressure was low, 250 ml NS bolus was given  Acute metabolic encephalopathy: likely exacerbated from [place]. CT head showed no acute intracranial findings. Poor mental status  1/18 patient was restless, Haldol as needed ordered    Left upper lobe lung mass: CT chest done on 07/15/2021 showed 3.4 x 2.9 cm spiculated mass consistent with malignancy.  Medical oncology and PCCM were consulted.  PCCM planning for bronchoscopy/PET scan as an outpatient.   Hypernatremia: free water deficit is 1.0L. Encourage free water intake     PAF: continue on CCB. Not on anticoagulation secondary to advanced age. Cardio recs apprec   Thrombocytopenia: labile, etiology unclear    Hx of COPD: continue treatment as stated above.     AKI on CKDII: Cr is trending up again today.  Hold lasix  US renal negative for any obstruction Urine retention, Foley catheter was inserted on 1/18 Started IV fluid for hydration Creatinine 3.53 --4.07 today   Hypokalemia: KCl repleated Hypomagnesemia, mag repleted.  HTN: continue on CCB   Vitamin B12 deficiency, vitamin B12 level 137, target >400, started vitamin B12 1000 mcg IM injection daily followed by oral supplement on discharge.  Follow with PCP to repeat vitamin B12 level after 3  months.  Failure to thrive: secondary to all below. Palliative care is following and recs apprec.  Severe protein calorie malnutrition: continue on nutritional supplements  Body mass index is 17.59 kg/m.  Nutrition Problem: Severe Malnutrition Etiology: chronic illness (COPD) Interventions:   Debility: PT/OT      1/21 patient's condition is gradually getting worse, patient was hypotensive and tachycardic, medical management done, IV fluid was given and Cardizem was given. Discussed with family at bedside, we will continue current treatment for now but no escalation of care.  Family is aware that patient may pass away today or tomorrow.  RN may pronounce death and please text me the exact time of death.  Diet: Keep n.p.o. currently AMS, risk of aspiration DVT Prophylaxis: Subcutaneous Heparin    Advance goals of care discussion: DNR  Family Communication: family was present at bedside, at the time of interview.  The pt provided permission to discuss medical plan with the family. Opportunity was given to ask question and all questions were answered  satisfactorily.   Disposition:  Pt is from Home, admitted with respiratory failure due to COVID viral pneumonia, still has respiratory failure, AMS, which precludes a safe discharge. Discharge to TBD PT and OT eval pending, still clinically not stable to discharge, may require 3 to 5 days more.. If no improvement over the weekend, plan is for hospice placement on Monday.  Subjective: No significant events overnight, patient remained obtunded and unresponsive.    Physical Exam: General: Obtunded, not oriented to time, place, and person.  Appear in moderate distress, a Eyes: closed, unresponsive ENT: Oral Mucosa dry  Neck: no JVD,  Cardiovascular: S1 and S2 Present, no Murmur,  Respiratory: increased respiratory effort, Bilateral Air entry equal and Decreased, mild Crackles, no wheezes Abdomen: Bowel Sound present, Soft and no  tenderness,  Skin: no rashes Extremities: no Pedal edema, no calf tenderness Neurologic: without any new focal findings Gait not checked due to patient safety concerns  Vitals:   07/31/21 1346 07/31/21 1400 07/31/21 1500 07/31/21 1600  BP:  (!) 91/40 (!) 101/43 (!) 88/42  Pulse:  81 79 84  Resp:  16 (!) 24 20  Temp:   98 F (36.7 C)   TempSrc:   Axillary   SpO2: 98% 98% 97% 97%  Weight:      Height:        Intake/Output Summary (Last 24 hours) at 07/31/2021 1618 Last data filed at 07/31/2021 0600 Gross per 24 hour  Intake 1548.67 ml  Output 200 ml  Net 1348.67 ml   Filed Weights   07/26/2021 1706  Weight: 45 kg    Data Reviewed: I have personally reviewed and interpreted daily labs, tele strips, imagings as discussed above. I reviewed all nursing notes, pharmacy notes, vitals, pertinent old records I have discussed plan of care as described above with RN and patient/family.  CBC: Recent Labs  Lab 07/27/21 0727 07/28/21 0651 07/29/21 0805 07/30/21 0755 07/31/21 0522  WBC 9.5 8.6 15.6* 15.8* 10.9*  HGB 11.1* 10.4* 11.4* 9.7* 9.3*  HCT 34.1* 31.8* 35.5* 30.2* 30.0*  MCV 94.5 95.8 94.9 95.3 98.7  PLT 144* 119* 138* 121* 99*   Basic Metabolic Panel: Recent Labs  Lab 07/28/21 0651 07/28/21 1010 07/29/21 0805 07/29/21 2211 07/30/21 0755 07/31/21 0522  NA 144  --  150* 149* 148* 142  K 3.3*  --  4.0 3.8 4.0 3.6  CL 105  --  107 113* 110 102  CO2 28  --  25 24 26 26   GLUCOSE 95  --  103* 154* 161* 109*  BUN 69*  --  83* 85* 86* 83*  CREATININE 2.83*  --  3.53* 3.86* 4.07* 3.95*  CALCIUM 8.4*  --  8.9 8.2* 8.2* 8.0*  MG  --  1.6* 2.6*  --  2.2 2.2  PHOS  --  4.5 5.0*  --  5.2* 4.1    Studies: No results found.  Scheduled Meds:  benzonatate  200 mg Oral TID   calcium-vitamin D  2 tablet Oral Daily   Chlorhexidine Gluconate Cloth  6 each Topical Daily   cholecalciferol  1,000 Units Oral Daily   citalopram  10 mg Oral Daily   cyanocobalamin  1,000 mcg  Intramuscular Daily   Followed by   Derrill Memo ON 08/04/2021] vitamin B-12  500 mcg Oral Daily   diltiazem  60 mg Oral Q6H   famotidine  20 mg Oral Daily   feeding supplement (NEPRO CARB STEADY)  237 mL Oral TID BM  fluticasone furoate-vilanterol  1 puff Inhalation Daily   guaiFENesin-dextromethorphan  10 mL Oral Q6H   heparin injection (subcutaneous)  5,000 Units Subcutaneous Q8H   ipratropium-albuterol  3 mL Nebulization Q6H   LORazepam       melatonin  2.5 mg Oral QHS   multivitamin with minerals  1 tablet Oral Daily   polyethylene glycol  17 g Oral Daily   Continuous Infusions:  azithromycin 500 mg (07/31/21 1109)   cefTRIAXone (ROCEPHIN)  IV 2 g (07/31/21 1034)   dextrose 75 mL/hr at 07/31/21 0158   PRN Meds: acetaminophen, acetaminophen, chlorpheniramine-HYDROcodone, diltiazem, HYDROcodone-acetaminophen, lip balm, LORazepam, magnesium hydroxide, metoprolol tartrate, morphine injection, ondansetron **OR** ondansetron (ZOFRAN) IV  Time spent: 35 minutes  Author: Val Riles. MD [facility] 07/31/2021 4:18 PM  To reach On-call, see care teams to locate the attending and reach out to them via [email or web address]. If 7PM-7AM, please contact night-coverage If you still have difficulty reaching the attending provider, please page the [facility] (Director on Call) for [facility] on amion for assistance.

## 2021-07-31 NOTE — Progress Notes (Signed)
Family of patient at bedside, patient had a 5.8 second period of asystole.  MD notified, fluid bolus and 5mg  of Cardizem given as ordered. Asked family if they would like a chaplin called, they declined.  MD arrived at bedside.

## 2021-07-31 NOTE — Progress Notes (Signed)
°   07/31/21 0800  Assess: MEWS Score  BP (!) 115/58  Pulse Rate (!) 130  ECG Heart Rate (!) 137  Resp (!) 24  SpO2 (!) 87 %  Assess: MEWS Score  MEWS Temp 0  MEWS Systolic 0  MEWS Pulse 3  MEWS RR 1  MEWS LOC 2  MEWS Score 6  MEWS Score Color Red  Assess: if the MEWS score is Yellow or Red  Were vital signs taken at a resting state? Yes  Focused Assessment Change from prior assessment (see assessment flowsheet)  Does the patient meet 2 or more of the SIRS criteria? Yes  Does the patient have a confirmed or suspected source of infection? Yes  Provider and Rapid Response Notified? Yes  MEWS guidelines implemented *See Row Information* Yes  Treat  MEWS Interventions Administered prn meds/treatments;Administered scheduled meds/treatments  Pain Scale Faces  Pain Score 6  Faces Pain Scale 6  Pain Type Acute pain  Pain Location Leg  Take Vital Signs  Increase Vital Sign Frequency  Red: Q 1hr X 4 then Q 4hr X 4, if remains red, continue Q 4hrs  Escalate  MEWS: Escalate Red: discuss with charge nurse/RN and provider, consider discussing with RRT  Notify: Charge Nurse/RN  Name of Charge Nurse/RN Notified Janett Billow  Date Charge Nurse/RN Notified 07/31/21  Time Charge Nurse/RN Notified 0800  Notify: Provider  Provider Name/Title Dr Dwyane Dee  Date Provider Notified 07/31/21  Time Provider Notified [phone]  Notification Type Page  Notification Reason Change in status  Provider response En route  Date of Provider Response 07/31/21  Time of Provider Response 0810  Notify: Rapid Response  Name of Rapid Response RN Notified Katie  Date Rapid Response Notified 07/31/21  Time Rapid Response Notified 0907  Document  Patient Outcome Not stable and remains on department  Progress note created (see row info) Yes  Assess: SIRS CRITERIA  SIRS Temperature  0  SIRS Pulse 1  SIRS Respirations  1  SIRS WBC 0  SIRS Score Sum  2   MD ordered prn's which were given as ordered, bolus which was  started, stated he would be at bedside soon, awaiting his arrival. Will continue to monitor.

## 2021-07-31 NOTE — Progress Notes (Signed)
@  approx. 1955, B. Randol Kern, provider on-call for attending, text-paged regarding pt's HR sustaining in 150s with SBPs persisting in the 90s. Provider quickly returned page and endorsed to give IV Lopressor instead of IV Cardizem to an effort to reduce pt's rate. Provider endorsed to give slowly over 10-15 minutes. Medication administered. @approx . 2032 pt converted to NSR 60s-70s. Will continue to monitor.

## 2021-07-31 NOTE — Progress Notes (Signed)
FIO2 increased to .60 due to desats

## 2021-08-01 LAB — BASIC METABOLIC PANEL
Anion gap: 12 (ref 5–15)
BUN: 78 mg/dL — ABNORMAL HIGH (ref 8–23)
CO2: 23 mmol/L (ref 22–32)
Calcium: 7.8 mg/dL — ABNORMAL LOW (ref 8.9–10.3)
Chloride: 102 mmol/L (ref 98–111)
Creatinine, Ser: 3.85 mg/dL — ABNORMAL HIGH (ref 0.44–1.00)
GFR, Estimated: 11 mL/min — ABNORMAL LOW (ref >60)
Glucose, Bld: 116 mg/dL — ABNORMAL HIGH (ref 70–99)
Potassium: 4.1 mmol/L (ref 3.5–5.1)
Sodium: 137 mmol/L (ref 135–145)

## 2021-08-01 LAB — CBC
HCT: 31.8 % — ABNORMAL LOW (ref 36.0–46.0)
Hemoglobin: 9.9 g/dL — ABNORMAL LOW (ref 12.0–15.0)
MCH: 31.1 pg (ref 26.0–34.0)
MCHC: 31.1 g/dL (ref 30.0–36.0)
MCV: 100 fL (ref 80.0–100.0)
Platelets: 108 10*3/uL — ABNORMAL LOW (ref 150–400)
RBC: 3.18 MIL/uL — ABNORMAL LOW (ref 3.87–5.11)
RDW: 14.6 % (ref 11.5–15.5)
WBC: 9.4 10*3/uL (ref 4.0–10.5)
nRBC: 0 % (ref 0.0–0.2)

## 2021-08-01 LAB — MAGNESIUM: Magnesium: 1.9 mg/dL (ref 1.7–2.4)

## 2021-08-01 LAB — PHOSPHORUS: Phosphorus: 5 mg/dL — ABNORMAL HIGH (ref 2.5–4.6)

## 2021-08-01 MED ORDER — IPRATROPIUM-ALBUTEROL 0.5-2.5 (3) MG/3ML IN SOLN: 3.0000 mL | Freq: Four times a day (QID) | RESPIRATORY_TRACT | Status: DC | PRN

## 2021-08-01 MED ORDER — MORPHINE SULFATE (PF) 2 MG/ML IV SOLN
2.0000 mg | INTRAVENOUS | Status: DC | PRN
  Administered 2021-08-01 – 2021-08-02 (×2): 2 mg via INTRAVENOUS
  Filled 2021-08-01 (×3): qty 1

## 2021-08-01 NOTE — Progress Notes (Signed)
Patient's son Jeral Fruit, and daughter [name] at bedside requesting status change to comfort care.  Dr Dwyane Dee notified, agreed with family wishes and ordered changed dose of morphine.

## 2021-08-01 NOTE — Progress Notes (Signed)
°   08/01/21 1500  Assess: MEWS Score  BP (!) 83/50  Pulse Rate (!) 144  ECG Heart Rate (!) 144  Resp 17  SpO2 91 %  Assess: MEWS Score  MEWS Temp 0  MEWS Systolic 1  MEWS Pulse 3  MEWS RR 0  MEWS LOC 2  MEWS Score 6  MEWS Score Color Red  Assess: if the MEWS score is Yellow or Red  Were vital signs taken at a resting state? Yes  Focused Assessment No change from prior assessment  Does the patient meet 2 or more of the SIRS criteria? Yes  Does the patient have a confirmed or suspected source of infection? Yes  Provider and Rapid Response Notified? No  MEWS guidelines implemented *See Row Information* Yes  Treat  MEWS Interventions Administered scheduled meds/treatments;Administered prn meds/treatments  Pain Scale Faces  Pain Score 2  Faces Pain Scale 2  Pain Type Chronic pain  Escalate  MEWS: Escalate Red: discuss with charge nurse/RN and provider, consider discussing with RRT  Notify: Charge Nurse/RN  Name of Charge Nurse/RN Notified Amy  Date Charge Nurse/RN Notified 08/01/21  Time Charge Nurse/RN Notified 1503  Notify: Provider  Provider Name/Title Dwyane Dee  Date Provider Notified 08/01/21  Time Provider Notified 1504  Notification Type Page  Provider response No new orders  Date of Provider Response 08/01/21  Time of Provider Response 1514  Document  Patient Outcome Not stable and remains on department  Progress note created (see row info) Yes  Assess: SIRS CRITERIA  SIRS Temperature  0  SIRS Pulse 1  SIRS Respirations  0  SIRS WBC 0  SIRS Score Sum  1

## 2021-08-01 NOTE — Progress Notes (Signed)
At request of pt's son and daughter, NRB removed from pt but at this time they would like to continue Heated HFNC. At their request, telemetry monitor switched to comfort mode to prevent alarming in pt's room.

## 2021-08-01 NOTE — Progress Notes (Signed)
[facility] Progress Note  Patient: [name] [name]    OBS:[id number]  DOA: 08/06/2021     Date of Service: the patient was seen and examined on 08/01/2021  Chief Complaint  Patient presents with   Shortness of Breath   Brief hospital course: HPI was taken from Dr. Sidney Ace: [name] [name] is a 88 y.o. Caucasian female with medical history significant for asthma/COPD and hypertension, who presented to the ER with acute onset of worsening dyspnea over the last 3 days.  The patient had a positive COVID-19 test on Thursday.  She has not been eating or drinking for the last few days.  She was noted to have mild confusion in the ER.  No loss of taste or smell.  She did have nausea, vomiting and diarrhea per her daughter today.  No chest pain or palpitations.  Per EMS her pulse oximetry was 81% on room air.  She was placed on 10 L with nonrebreather and pulse oximetry was in the mid 90s.  She was also given 1 DuoNeb and 1 albuterol nebulizer on route to the hospital.  She admitted to dry cough without expectoration.  She denies any nausea or vomiting however admits to lower abdominal pain.  No chest pain or palpitations.     ED Course: Upon presentation to the emergency room respiratory rate was 34 and pulse oximetry 87% on room air with a pulse oximetry of 144/59.  Labs revealed a VBG with pH 7.4 and HCO3 31.  BMP was remarkable for hypokalemia hyponatremia and hypochloremia and CBC showed leukopenia of 2.1 and thrombocytopenia with otherwise normal levels.  Influenza antigens came back negative.  COVID-19 PCR came back positive. EKG as reviewed by me : EKG showed sinus tachycardia with a rate of 1 1 with LAD and LVH. Imaging: Chest x-ray showed opacity in the left midlung concerning for pneumonia, emphysema, aortic atherosclerosis and severe elevation of the left hemidiaphragm.  The patient was given 40 mg of IV Lasix, DuoNeb and albuterol.  She will be admitted to a medical telemetry bed for  further evaluation and management.     As per Dr. Rowe Pavy: This patient is a 88 year old female with history of asthma/COPD, hypertension, paroxysmal A. fib who presented here from home with complaints of acute onset of worsening dyspnea, poor oral intake, cough, confusion.  On presentation, she was hypoxic on room air and had to be placed on 10 L of oxygen.  Chest x-ray showed opacity in the left midlung concerning for pneumonia, emphysema.  COVID screen test came out  to be positive.  Hospital course remarkable for worsening confusion, hypoxia.  Overall status was improving, nearing to discharge.  But in the morning of 07/19/2021, she went into A. fib with RVR requiring Cardizem drip.       As per Dr. Jimmye Norman 1/15-1/17/23: Pt has made a turn for the worse. Pt's mental status has declined as well as respiratory status. CXR showed pulmonary edema. Pt was placed on IV lasix. ABG was done also on 07/25/21 which showed O2 of < 31. Pt was initially placed on BiPAP but pt could not tolerate it as a result pt was placed on HFNC. The following day the pt's respiratory status did not improve but pt refused to keep HFNC on and pt was placed on [place]. Comfort care has been discussed w/ the pt's daughter and son but for now they want to continue w/ treatment but still are agreeable to DNR/DNI. Palliative care is also  following, please see their note. Poor prognosis   Please review prior detailed notes.  I started taking care of this patient on 07/28/2021  Assessment and Plan: Principal Problem:   Acute hypoxemic respiratory failure due to COVID-19 [facility]) Active Problems:   Protein-calorie malnutrition, severe   Major depressive disorder, single episode, moderate (HCC)      Acute hypoxic respiratory failure: secondary to COVID-19 infection/COPD exacerbation/malignancy.  Completed COVID19 treatment course.  Continue on bronchodilators and encourage incentive spirometry.  ABG shows pO2 < 31, tried on BiPAP but pt  could not tolerate so pt placed on HFNC.  Pt would not keep on HFNC so pt was placed on [place]  Repeat CXR shows COPD changes & left lower lung mass again seen.   Hold lasix today for elevated Cr. High risk for further deterioration. Poor prognosis.  Acute pulmonary edema: as per CXR. Hold lasix today secondary to worsening Cr. Monitor I/Os 1/19 WBC count elevated, patient was having cough and shortness of breath, possibility of bacterial infection So started ceftriaxone and azithromycin Started Breo Ellipta inhaler and DuoNeb every 6 hourly scheduled   Dehydration secondary to decreased oral intake due to AMS Serum osmolarity 337 Started D5 IV fluid for hydration 1/20 blood pressure was low, 250 ml NS bolus was given  Acute metabolic encephalopathy: likely exacerbated from [place]. CT head showed no acute intracranial findings. Poor mental status  1/18 patient was restless, Haldol as needed ordered    Left upper lobe lung mass: CT chest done on 07/15/2021 showed 3.4 x 2.9 cm spiculated mass consistent with malignancy.  Medical oncology and PCCM were consulted.  PCCM planning for bronchoscopy/PET scan as an outpatient.   Hypernatremia: free water deficit is 1.0L. Encourage free water intake     PAF: continue on CCB. Not on anticoagulation secondary to advanced age. Cardio recs apprec   Thrombocytopenia: labile, etiology unclear    Hx of COPD: continue treatment as stated above.     AKI on CKDII: Cr is trending up again today.  Hold lasix  US renal negative for any obstruction Urine retention, Foley catheter was inserted on 1/18 Started IV fluid for hydration Creatinine 3.53 --4.07 today   Hypokalemia: KCl repleated Hypomagnesemia, mag repleted.  HTN: continue on CCB   Vitamin B12 deficiency, vitamin B12 level 137, target >400, started vitamin B12 1000 mcg IM injection daily followed by oral supplement on discharge.  Follow with PCP to repeat vitamin B12 level after 3  months.  Failure to thrive: secondary to all below. Palliative care is following and recs apprec.  Severe protein calorie malnutrition: continue on nutritional supplements  Body mass index is 17.59 kg/m.  Nutrition Problem: Severe Malnutrition Etiology: chronic illness (COPD) Interventions:   Debility: PT/OT      1/21 patient's condition is gradually getting worse, patient was hypotensive and tachycardic, medical management done, IV fluid was given and Cardizem was given. Discussed with family at bedside, we will continue current treatment for now but no escalation of care.  Family is aware that patient may pass away today or tomorrow.  1/22 no improvement in patient's condition, she is in the process of passing away.  It may happen today or tomorrow.  We will continue above treatment and no aggressive management  RN may pronounce death and please text me the exact time of death.  Diet: Keep n.p.o. currently AMS, risk of aspiration DVT Prophylaxis: Subcutaneous Heparin    Advance goals of care discussion: DNR  Family Communication: family  was NOT present at bedside, at the time of interview.  Goals of care discussed with family on 1/21, no escalation of care, we will continue current treatment and keep her comfortable.  Disposition:  Pt is from Home, admitted with respiratory failure due to COVID viral pneumonia, still has respiratory failure, AMS, which precludes a safe discharge. Discharge to hospice if survives, patient may pass away in 1 to 2 days, currently in very critical condition.     Subjective: No significant events overnight, patient remained obtunded and unresponsive.    Physical Exam: General: Obtunded, not oriented to time, place, and person.  Appear in moderate distress, a Eyes: closed, unresponsive ENT: Oral Mucosa dry  Neck: no JVD,  Cardiovascular: S1 and S2 Present, no Murmur,  Respiratory: increased respiratory effort, Bilateral Air entry equal and  Decreased, mild Crackles, no wheezes Abdomen: Bowel Sound present, Soft and no tenderness,  Skin: no rashes Extremities: no Pedal edema, no calf tenderness Neurologic: without any new focal findings Gait not checked due to patient safety concerns  Vitals:   08/01/21 0807 08/01/21 1000 08/01/21 1107 08/01/21 1330  BP: (!) 104/46 (!) 91/44 (!) 87/43   Pulse: 74 77 82   Resp: (!) 22 16 15    Temp: 99 F (37.2 C)  97.6 F (36.4 C)   TempSrc: Axillary  Oral   SpO2: 97% 90% 93% (!) 87%  Weight:      Height:        Intake/Output Summary (Last 24 hours) at 08/01/2021 1525 Last data filed at 08/01/2021 1428 Gross per 24 hour  Intake 2078.15 ml  Output 225 ml  Net 1853.15 ml   Filed Weights   08/03/2021 1706  Weight: 45 kg    Data Reviewed: I have personally reviewed and interpreted daily labs, tele strips, imagings as discussed above. I reviewed all nursing notes, pharmacy notes, vitals, pertinent old records I have discussed plan of care as described above with RN and patient/family.  CBC: Recent Labs  Lab 07/28/21 0651 07/29/21 0805 07/30/21 0755 07/31/21 0522 08/01/21 0719  WBC 8.6 15.6* 15.8* 10.9* 9.4  HGB 10.4* 11.4* 9.7* 9.3* 9.9*  HCT 31.8* 35.5* 30.2* 30.0* 31.8*  MCV 95.8 94.9 95.3 98.7 100.0  PLT 119* 138* 121* 99* 127*   Basic Metabolic Panel: Recent Labs  Lab 07/28/21 1010 07/29/21 0805 07/29/21 2211 07/30/21 0755 07/31/21 0522 08/01/21 0719  NA  --  150* 149* 148* 142 137  K  --  4.0 3.8 4.0 3.6 4.1  CL  --  107 113* 110 102 102  CO2  --  25 24 26 26 23   GLUCOSE  --  103* 154* 161* 109* 116*  BUN  --  83* 85* 86* 83* 78*  CREATININE  --  3.53* 3.86* 4.07* 3.95* 3.85*  CALCIUM  --  8.9 8.2* 8.2* 8.0* 7.8*  MG 1.6* 2.6*  --  2.2 2.2 1.9  PHOS 4.5 5.0*  --  5.2* 4.1 5.0*    Studies: No results found.  Scheduled Meds:  benzonatate  200 mg Oral TID   calcium-vitamin D  2 tablet Oral Daily   Chlorhexidine Gluconate Cloth  6 each Topical Daily    cholecalciferol  1,000 Units Oral Daily   citalopram  10 mg Oral Daily   cyanocobalamin  1,000 mcg Intramuscular Daily   Followed by   Derrill Memo ON 08/04/2021] vitamin B-12  500 mcg Oral Daily   diltiazem  60 mg Oral Q6H   famotidine  20  mg Oral Daily   feeding supplement (NEPRO CARB STEADY)  237 mL Oral TID BM   fluticasone furoate-vilanterol  1 puff Inhalation Daily   guaiFENesin-dextromethorphan  10 mL Oral Q6H   heparin injection (subcutaneous)  5,000 Units Subcutaneous Q8H   ipratropium-albuterol  3 mL Nebulization Q6H   melatonin  2.5 mg Oral QHS   multivitamin with minerals  1 tablet Oral Daily   polyethylene glycol  17 g Oral Daily   Continuous Infusions:  azithromycin 500 mg (08/01/21 0908)   cefTRIAXone (ROCEPHIN)  IV 2 g (08/01/21 0954)   dextrose 75 mL/hr at 08/01/21 0600   PRN Meds: acetaminophen, acetaminophen, chlorpheniramine-HYDROcodone, diltiazem, HYDROcodone-acetaminophen, lip balm, LORazepam, magnesium hydroxide, metoprolol tartrate, morphine injection, ondansetron **OR** ondansetron (ZOFRAN) IV  Time spent: 35 minutes  Author: Val Riles. MD [facility] 08/01/2021 3:25 PM  To reach On-call, see care teams to locate the attending and reach out to them via [email or web address]. If 7PM-7AM, please contact night-coverage If you still have difficulty reaching the attending provider, please page the [facility] (Director on Call) for [facility] on amion for assistance.

## 2021-08-02 DIAGNOSIS — Z7189 Other specified counseling: Secondary | ICD-10-CM

## 2021-08-02 MED ORDER — MORPHINE SULFATE (PF) 2 MG/ML IV SOLN: 2.0000 mg | INTRAVENOUS | Status: DC | PRN

## 2021-08-02 MED ORDER — MORPHINE SULFATE (PF) 2 MG/ML IV SOLN
2.0000 mg | INTRAVENOUS | Status: DC | PRN
  Administered 2021-08-02: 4 mg via INTRAVENOUS
  Filled 2021-08-02: qty 2

## 2021-08-11 NOTE — Progress Notes (Signed)
PT Cancellation Note  Patient Details Name: [name] [name] MRN: [id number] DOB: 1930-02-17   Cancelled Treatment:    Reason Eval/Treat Not Completed: Other (comment). Comfort care status confirmed with MD, PT to sign off.    Lieutenant Diego PT, DPT 8:37 AM,08-17-21

## 2021-08-11 NOTE — Progress Notes (Signed)
Pt fully comfort care now. Per Crystal NP okay to take pt off heated high flow per family request.

## 2021-08-11 NOTE — Progress Notes (Signed)
Daily Progress Note   Patient Name: [name] [name]       Date: August 03, 2021 DOB: June 11, 1930  Age: 88 y.o. MRN#: [id number] Attending Physician: Val Riles, MD Primary Care Physician: Jerrol Banana., MD Admit Date: 07/20/2021  Reason for Consultation/Follow-up: Establishing goals of care and Terminal Care  Subjective: Patient is resting in bed. Monitor is on and HR is 170's-200. Patient appears to have work of breathing. Daughter and son are at bedside. They state last night they made the decision to transition to comfort care and the mask was replaced with high flow.  At present, with high glow cannula, O2 sat is 50's-60's. D5% NS is in place.  They state she is a woman of faith. They state she had been seeing heaven, and people who are already deceased. They state they thought she would die last night but she has not. Discussed comfort care and a peaceful and natural transition out of this world and into heaven.  They state they do not want the high flow in place, that she had been trying to remove it. Discussed the D5 infusion and they state they want this discontinued. They would like a peaceful and natural death for their mother.   Stepped out to speak with nurse and place orders. Returned to bedside and daughter states patient has been moaning.   Medication provided by RN and patient appears more comfortable. High flow cannula discontinued. D5 infusion discontinued. Remained at bedside to be sure no other medications or interventions were needed. Patient continues to appear comfortable. Stepped out to give family time with patient. Family understands her prognosis is minutes to hours.   I completed a MOST form today with daughter and son at bedside, and the signed original was  placed in the chart. A photocopy was placed in the chart to be scanned into EMR. The patient outlined their wishes for the following treatment decisions:  Cardiopulmonary Resuscitation: Do Not Attempt Resuscitation (DNR/No CPR)  Medical Interventions: Comfort Measures: Keep clean, warm, and dry. Use medication by any route, positioning, wound care, and other measures to relieve pain and suffering. Use oxygen, suction and manual treatment of airway obstruction as needed for comfort. Do not transfer to the hospital unless comfort needs cannot be met in current location.  Antibiotics: No antibiotics (use other measures  to relieve symptoms)  IV Fluids: No IV fluids (provide other measures to ensure comfort)  Feeding Tube: No feeding tube     Length of Stay: 22  Current Medications: Scheduled Meds:   Chlorhexidine Gluconate Cloth  6 each Topical Daily   fluticasone furoate-vilanterol  1 puff Inhalation Daily    Continuous Infusions:   PRN Meds: acetaminophen, acetaminophen, diltiazem, ipratropium-albuterol, lip balm, LORazepam, metoprolol tartrate, morphine injection, ondansetron **OR** ondansetron (ZOFRAN) IV  Physical Exam Constitutional:      Comments: Eyes closed.   Pulmonary:     Comments: Work of breathing noted Skin:    General: Skin is warm.     Comments: Diaphoretic behind head on pillow.             Vital Signs: BP (!) 86/40 (BP Location: Right Arm)    Pulse (!) 110    Temp 98.4 F (36.9 C) (Axillary)    Resp 19    Ht _0  (1.6 m)    Wt 45 kg    SpO2 (!) 83%    BMI 17.59 kg/m  SpO2: SpO2: (!) 83 % O2 Device: O2 Device: High Flow Nasal Cannula O2 Flow Rate: O2 Flow Rate (L/min): 50 L/min  Intake/output summary:  Intake/Output Summary (Last 24 hours) at 08/22/2021 1147 Last data filed at 08/22/21 0700 Gross per 24 hour  Intake 601.87 ml  Output 0 ml  Net 601.87 ml   LBM: Last BM Date: 07/24/21 Baseline Weight: Weight: 45 kg Most recent weight: Weight: 45 kg         Patient Active Problem List   Diagnosis Date Noted   Major depressive disorder, single episode, moderate (HCC) 07/22/2021   Protein-calorie malnutrition, severe 07/13/2021   Acute hypoxemic respiratory failure due to COVID-19 ([place]) 07/27/2021   Pulmonary emphysema (HCC) 11/16/2015   Chronic obstructive pulmonary emphysema (HCC) 12/24/2014   Allergic rhinitis 12/23/2014   Back pain, chronic 12/23/2014   Barrett's esophagus with low grade dysplasia 12/23/2014   Aneurysm, cerebral 12/23/2014   CAFL (chronic airflow limitation) (HCC) 12/23/2014   Cognitive decline 12/23/2014   Essential (primary) hypertension 12/23/2014   Fatigue 12/23/2014   Acid reflux 12/23/2014   H/O peptic ulcer 12/23/2014   History of biliary disease 12/23/2014   HLD (hyperlipidemia) 12/23/2014   Insomnia related to another mental disorder 12/23/2014   Arthritis, degenerative 12/23/2014   OP (osteoporosis) 12/23/2014   Plantar fasciitis 12/23/2014   Hypercholesterolemia without hypertriglyceridemia 12/23/2014   Peptic ulcer 12/23/2014   Kidney cysts 12/23/2014   Fast heart beat 12/23/2014   Tubular adenoma 12/23/2014   Vertebral compression fracture ([place]) 12/23/2014   Avitaminosis D 12/23/2014   A-fib ([place]) 04/08/2014   Hypertension 08/06/2007   EMPHYSEMA 08/06/2007   ASTHMA 08/06/2007   COPD (chronic obstructive pulmonary disease) ([place]) 08/06/2007    Palliative Care Assessment & Plan    Recommendations/Plan: Patient is actively dying. Full comfort care initiated.   Code Status:    Code Status Orders  (From admission, onward)           Start     Ordered   07/24/21 0907  Do not attempt resuscitation (DNR)  Continuous       Question Answer Comment  In the event of cardiac or respiratory ARREST Do not call a code blue   In the event of cardiac or respiratory ARREST Do not perform Intubation, CPR, defibrillation or ACLS   In the event of cardiac or respiratory ARREST Use medication by  any route,  position, wound care, and other measures to relive pain and suffering. May use oxygen, suction and manual treatment of airway obstruction as needed for comfort.      07/24/21 0906           Code Status History     Date Active Date Inactive Code Status Order ID Comments User Context   07/12/2021 2226 07/24/2021 0906 Partial Code [id number]  Sidney Ace Arvella Merles, MD ED   07/17/2021 1957 07/17/2021 2226 Full Code [id number]  Mansy, Arvella Merles, MD ED       Prognosis: Minutes to hours   Care plan was discussed with RN  Thank you for allowing the Palliative Medicine Team to assist in the care of this patient.    10:50 11:55  Total Time 65 min Prolonged Time Billed  yes      Greater than 50%  of this time was spent counseling and coordinating care related to the above assessment and plan.  Asencion Gowda, NP  Please contact Palliative Medicine Team phone at [phone] for questions and concerns.

## 2021-08-11 NOTE — Discharge Summary (Signed)
[facility] Discharge Summary   Death summary Date certificate done, case ID [id number]   Patient: [name] [name] JYN:[id number]  PCP: Jerrol Banana., MD  Date of admission: 08/04/2021   Date of discharge:  08/29/21     Discharge Diagnoses:  Principal Problem:   Acute hypoxemic respiratory failure due to COVID-19 [facility]) Active Problems:   Protein-calorie malnutrition, severe   Major depressive disorder, single episode, moderate ([place])   Admitted From: Home Disposition: Deceased  Recommendations for Outpatient Follow-up:  PCP:  Deceased Follow up LABS/TEST:   Deceased   Diet recommendation:  Deceased Activity:  Deceased Discharge Condition:  Deceased Code Status: DNR   History of present illness: As per the H and P dictated on admission Hospital Course:  HPI was taken from Dr. Sidney Ace: [name] [name] is a 88 y.o. Caucasian female with medical history significant for asthma/COPD and hypertension, who presented to the ER with acute onset of worsening dyspnea over the last 3 days.  The patient had a positive COVID-19 test on Thursday.  She has not been eating or drinking for the last few days.  She was noted to have mild confusion in the ER.  No loss of taste or smell.  She did have nausea, vomiting and diarrhea per her daughter today.  No chest pain or palpitations.  Per EMS her pulse oximetry was 81% on room air.  She was placed on 10 L with nonrebreather and pulse oximetry was in the mid 90s.  She was also given 1 DuoNeb and 1 albuterol nebulizer on route to the hospital.  She admitted to dry cough without expectoration.  She denies any nausea or vomiting however admits to lower abdominal pain.  No chest pain or palpitations.   ED Course: Upon presentation to the emergency room respiratory rate was 34 and pulse oximetry 87% on room air with a pulse oximetry of 144/59.  Labs revealed a VBG with pH 7.4 and HCO3 31.  BMP was remarkable for hypokalemia hyponatremia and  hypochloremia and CBC showed leukopenia of 2.1 and thrombocytopenia with otherwise normal levels.  Influenza antigens came back negative.  COVID-19 PCR came back positive. EKG as reviewed by me : EKG showed sinus tachycardia with a rate of 1 1 with LAD and LVH. Imaging: Chest x-ray showed opacity in the left midlung concerning for pneumonia, emphysema, aortic atherosclerosis and severe elevation of the left hemidiaphragm. The patient was given 40 mg of IV Lasix, DuoNeb and albuterol.  She will be admitted to a medical telemetry bed for further evaluation and management. As per Dr. Rowe Pavy: This patient is a 88 year old female with history of asthma/COPD, hypertension, paroxysmal A. fib who presented here from home with complaints of acute onset of worsening dyspnea, poor oral intake, cough, confusion.  On presentation, she was hypoxic on room air and had to be placed on 10 L of oxygen.  Chest x-ray showed opacity in the left midlung concerning for pneumonia, emphysema.  COVID screen test came out  to be positive.  Hospital course remarkable for worsening confusion, hypoxia.  Overall status was improving, nearing to discharge.  But in the morning of 07/19/2021, she went into A. fib with RVR requiring Cardizem drip.   As per Dr. Jimmye Norman 1/15-1/17/23: Pt has made a turn for the worse. Pt's mental status has declined as well as respiratory status. CXR showed pulmonary edema. Pt was placed on IV lasix. ABG was done also on 07/25/21 which showed O2 of < 31. Pt  was initially placed on BiPAP but pt could not tolerate it as a result pt was placed on HFNC. The following day the pt's respiratory status did not improve but pt refused to keep HFNC on and pt was placed on [place]. Comfort care has been discussed w/ the pt's daughter and son but for now they want to continue w/ treatment but still are agreeable to DNR/DNI. Palliative care is also following, please see their note. Poor prognosis    Please review prior detailed  notes.  I started taking care of this patient on 07/28/2021   Assessment and Plan: Principal Problem:   Acute hypoxemic respiratory failure due to COVID-19 [facility]) Active Problems:   Protein-calorie malnutrition, severe   Major depressive disorder, single episode, moderate (HCC)    Acute hypoxic respiratory failure: secondary to COVID-19 infection/COPD exacerbation/malignancy. Completed COVID19 treatment course. S/p bronchodilators and encourage incentive spirometry. ABG shows pO2 < 31, tried on BiPAP but pt could not tolerate so pt placed on HFNC. Pt would not keep on HFNC so pt was placed on [place]  Repeat CXR shows COPD changes & left lower lung mass again seen.   Hold lasix today for elevated Cr. High risk for further deterioration. Poor prognosis.  Acute pulmonary edema: as per CXR. Hold lasix secondary to worsening Cr. Monitor I/Os 1/19 WBC count elevated, patient was having cough and shortness of breath, possibility of bacterial infection, So started ceftriaxone and azithromycin. S/p Breo Ellipta inhaler and DuoNeb every 6 hourly scheduled Dehydration secondary to decreased oral intake due to AMS, Serum osmolarity 337 Started D5 IV fluid for hydration, 1/20 blood pressure was low, 250 ml NS bolus was given Acute metabolic encephalopathy: likely exacerbated from [place]. CT head showed no acute intracranial findings. Poor mental status, 1/18 patient was restless, Haldol as needed ordered Left upper lobe lung mass: CT chest done on 07/15/2021 showed 3.4 x 2.9 cm spiculated mass consistent with malignancy.  Medical oncology and PCCM were consulted.  PCCM planning for bronchoscopy/PET scan as an outpatient. Hypernatremia: free water deficit is 1.0L. Encourage free water intake but gradually patient became altered mental status, risk of aspiration so started on D5 IV fluid.  Hyponatremia resolved. PAF: continue on CCB. Not on anticoagulation secondary to advanced age. Cardio recs apprec Thrombocytopenia:  labile, etiology unclear  Hx of COPD: continue treatment as stated above.  AKI on CKDII: Hold lasix, renal functions were monitored. US renal negative for any obstruction, most likely due to urine retention and dehydration Urine retention, Foley catheter was inserted on 1/18, Started IV fluid for hydration Hypokalemia: KCl repleated Hypomagnesemia, mag repleted. HTN: continued on CCB Vitamin B12 deficiency, vitamin B12 level 137, target >400, started vitamin B12 1000 mcg IM injection daily, plan was to start oral supplement on discharge.   Failure to thrive: secondary to all below. Palliative care was following and recs apprec.  Severe protein calorie malnutrition: s/p nutritional supplements  Body mass index is 17.59 kg/m.  Nutrition Problem: Severe Malnutrition Etiology: chronic illness (COPD)  1/21 patient's condition is gradually getting worse, patient was hypotensive and tachycardic, medical management done, IV fluid was given and Cardizem was given. Discussed with family at bedside, we will continue current treatment for now but no escalation of care.  Family is aware that patient may pass away today or tomorrow.   1/22 no improvement in patient's condition, she is in the process of passing away.  It may happen today or tomorrow.  We will continue above treatment and no  aggressive management  Deceased Patient expired on 08-20-21 at 11:58 AM, death was pronounced by RN.  Patient was comfort measures only since yesterday. Cause of death COVID-19 pneumonia   Body mass index is 17.59 kg/m.  Nutrition Problem: Severe Malnutrition Etiology: chronic illness (COPD) Nutrition Interventions:    Consultants: Palliative care Procedures: None  Discharge Exam: Deceased now  Patient was examined in the a.m. at that time patient was obtunded, unresponsive, resting comfortably, with deep shallow breathing, S1-S2 audible.   Filed Weights   07/23/2021 1706 August 20, 2021 1200  Weight: 45 kg 45  kg   Vitals:   2021/08/20 0600 08-20-2021 0816  BP:  (!) 86/40  Pulse: (!) 112 (!) 110  Resp: (!) 23 19  Temp:  98.4 F (36.9 C)  SpO2: (!) 71% (!) 83%    DISCHARGE MEDICATION: Allergies as of August 20, 2021       Reactions   Ciprofloxacin    Unknown   Macrobid  [nitrofurantoin]    Unknown reaction   Metoprolol Swelling   feet swelling/pain   Sulfa Antibiotics    Upset stomach    Sulfonamide Derivatives    Upset stomach      Deceased       Durable Medical Equipment  (From admission, onward)           Start     Ordered   07/15/21 1011  For home use only DME oxygen  Once       Question Answer Comment  Length of Need 6 Months   Mode or (Route) Nasal cannula   Liters per Minute 2   Frequency Continuous (stationary and portable oxygen unit needed)   Oxygen conserving device Yes   Oxygen delivery system Gas      07/15/21 1011           Allergies  Allergen Reactions   Ciprofloxacin     Unknown   Macrobid  [Nitrofurantoin]     Unknown reaction   Metoprolol Swelling    feet swelling/pain   Sulfa Antibiotics     Upset stomach    Sulfonamide Derivatives     Upset stomach     The results of significant diagnostics from this hospitalization (including imaging, microbiology, ancillary and laboratory) are listed below for reference.    Significant Diagnostic Studies: CT HEAD WO CONTRAST (5MM)  Result Date: 07/16/2021 CLINICAL DATA:  Delirium EXAM: CT HEAD WITHOUT CONTRAST TECHNIQUE: Contiguous axial images were obtained from the base of the skull through the vertex without intravenous contrast. COMPARISON:  None. FINDINGS: Brain: No evidence of acute infarction, hemorrhage, cerebral edema, mass, mass effect, or midline shift. Arachnoid cyst anterior to the left temporal lobe. No hydrocephalus or acute extra-axial fluid collection. Vascular: No hyperdense vessel. Skull: Normal. Negative for fracture or focal lesion. Sinuses/Orbits: No acute finding. Status post  bilateral lens replacements. Other: The mastoid air cells are well aerated. IMPRESSION: IMPRESSION No acute intracranial process. Electronically Signed   By: Merilyn Baba M.D.   On: 07/16/2021 11:44   CT CHEST WO CONTRAST  Result Date: 07/15/2021 CLINICAL DATA:  Dyspnea.  Positive COVID-19. EXAM: CT CHEST WITHOUT CONTRAST TECHNIQUE: Multidetector CT imaging of the chest was performed following the standard protocol without IV contrast. COMPARISON:  October 01, 2012. FINDINGS: Cardiovascular: Atherosclerosis of thoracic aorta is noted without aneurysm formation. Mild cardiomegaly is noted. No pericardial effusion is noted. Coronary calcifications are noted. Mediastinum/Nodes: No enlarged mediastinal or axillary lymph nodes. Thyroid gland, trachea, and esophagus demonstrate no significant findings. Lungs/Pleura:  Elevated left hemidiaphragm is noted. No pneumothorax or pleural effusion is noted. 3.4 x 2.9 cm spiculated mass is noted laterally in the left upper lobe consistent with malignancy. Minimal right posterior basilar subsegmental atelectasis is noted. Upper Abdomen: No acute abnormality. Musculoskeletal: Probable old lower thoracic vertebral body fracture is noted. No definite acute abnormality is noted. IMPRESSION: 3.4 x 2.9 cm spiculated mass is noted in left upper lobe consistent with malignancy. PET scan is recommended for further evaluation. Elevated left hemidiaphragm is noted. Aortic Atherosclerosis (ICD10-I70.0). Electronically Signed   By: Marijo Conception M.D.   On: 07/15/2021 15:18   US RENAL  Result Date: 07/28/2021 CLINICAL DATA:  AK I EXAM: RENAL / URINARY TRACT ULTRASOUND COMPLETE COMPARISON:  Prior sonogram dated 11/23/2013. FINDINGS: Right Kidney: Renal measurements: 8.3 x 3.1 x 3.4 = volume: 45.7 mL. Echogenic renal cortex. No mass or hydronephrosis visualized. Left Kidney: Not visualized. Bladder: Prevoid urinary bladder volume was thigh 72 mL. Other: None. IMPRESSION: 1. Echogenic right  renal cortex concerning for medical renal disease. No evidence of nephrolithiasis or hydronephrosis. 2. Left kidney not visualized, which could be secondary to advanced atrophy as seen on prior examination, exam is somewhat limited due to patient's inability to cooperate. 3.  Urinary bladder is unremarkable. Electronically Signed   By: Keane Police D.O.   On: 07/28/2021 13:10   US Venous Img Lower Bilateral (DVT)  Result Date: 07/15/2021 CLINICAL DATA:  Malignancy, elevated D-dimer, COVID positive EXAM: BILATERAL LOWER EXTREMITY VENOUS DOPPLER ULTRASOUND TECHNIQUE: Gray-scale sonography with compression, as well as color and duplex ultrasound, were performed to evaluate the deep venous system(s) from the level of the common femoral vein through the popliteal and proximal calf veins. COMPARISON:  None. FINDINGS: VENOUS Normal compressibility of the common femoral, superficial femoral, and popliteal veins, as well as the visualized calf veins. Visualized portions of profunda femoral vein and great saphenous vein unremarkable. No filling defects to suggest DVT on grayscale or color Doppler imaging. Doppler waveforms show normal direction of venous flow, normal respiratory plasticity and response to augmentation. OTHER None. Limitations: none IMPRESSION: 1. No evidence of deep venous thrombosis within either lower extremity. Electronically Signed   By: Randa Ngo M.D.   On: 07/15/2021 19:43   DG Chest Port 1 View  Result Date: 07/29/2021 CLINICAL DATA:  Hypoxia EXAM: PORTABLE CHEST 1 VIEW COMPARISON:  07/27/2021 FINDINGS: Cardiac shadow is within normal limits. Aortic calcifications are again seen. Pleural base mass lesion is again noted on the left stable in appearance from the prior exam. No pneumothorax is seen. Small pleural effusions are noted. Right basilar airspace opacity is noted new from the prior exam. No bony abnormality is seen. IMPRESSION: Stable left pleural base mass lesion laterally.  Increasing right basilar atelectasis. Small effusions. Electronically Signed   By: Inez Catalina M.D.   On: 07/29/2021 22:00   DG Chest Port 1 View  Result Date: 07/27/2021 CLINICAL DATA:  Acute renal failure EXAM: PORTABLE CHEST 1 VIEW COMPARISON:  Portable exam 1230 hours compared to 07/25/2021 FINDINGS: Persistent marked elevation of LEFT diaphragm. Normal heart size and mediastinal contours. Atherosclerotic calcification aorta. Calcified LEFT hilar lymph nodes. Mass can dental fide at lateral aspect of the inferior LEFT hemithorax 4.2 x 3.0 cm unchanged since CT. Bibasilar atelectasis and suspect underlying emphysematous changes. No definite acute infiltrate, pleural effusion or pneumothorax. Osseous demineralization with marked dextroconvex thoracolumbar scoliosis. IMPRESSION: Suspected COPD changes with bibasilar atelectasis. LEFT lower lung mass again seen. Chronic elevation of  LEFT diaphragm. Aortic Atherosclerosis (ICD10-I70.0) and Emphysema (ICD10-J43.9). Electronically Signed   By: Lavonia Dana M.D.   On: 07/27/2021 12:43   DG Chest Port 1 View  Result Date: 07/25/2021 CLINICAL DATA:  Difficulty breathing EXAM: PORTABLE CHEST 1 VIEW COMPARISON:  Previous studies including chest radiograph done on 07/20/2021 FINDINGS: There is marked elevation of left hemidiaphragm suggesting eventration or paralysis. Transverse diameter of heart is slightly increased. Central pulmonary vessels are more prominent. Increased interstitial and alveolar markings are seen in the right parahilar region and right lower lung fields. Increased markings are seen in the left lower lung fields. Lateral CP angles are indistinct. There is no pneumothorax. IMPRESSION: There is interval increase in interstitial and alveolar markings in the right parahilar region and right lower lung fields suggesting pneumonia and possibly asymmetric pulmonary edema. Possible small bilateral pleural effusions. Subsegmental atelectasis is seen in the  left lower lung fields. Electronically Signed   By: Elmer Picker M.D.   On: 07/25/2021 10:40   DG Chest Port 1 View  Result Date: 07/20/2021 CLINICAL DATA:  Shortness of breath. EXAM: PORTABLE CHEST 1 VIEW COMPARISON:  Chest x-ray and CT chest dated July 15, 2021. FINDINGS: The patient is rotated to the left. Stable cardiomediastinal silhouette with normal heart size. The lungs remain hyperinflated. Unchanged peripheral left upper lobe mass. Unchanged mild left basilar atelectasis. The right lung is clear. No pneumothorax or large pleural effusion. Unchanged elevation of the left hemidiaphragm. No acute osseous abnormality. IMPRESSION: 1. Unchanged peripheral left upper lobe mass. Electronically Signed   By: Titus Dubin M.D.   On: 07/20/2021 11:06   DG Chest Port 1 View  Result Date: 07/15/2021 CLINICAL DATA:  Short of breath.  History of COPD. EXAM: PORTABLE CHEST 1 VIEW COMPARISON:  07/22/2021 FINDINGS: Marked elevation left hemidiaphragm unchanged. Density in the left lateral lung base unchanged. This could be pleural or parenchymal in nature. This was not present in 2017. Pulmonary hyperinflation. Negative for heart failure. Atherosclerotic aortic arch. Right lung clear. No significant pleural effusion. IMPRESSION: No interval change. Elevated left hemidiaphragm with density in the left lateral lung base. This could be pleural based or parenchymal. Nonspecific appearance with differential including pneumonia, pleural thickening, and tumor. Recommend CT chest, preferably with intravenous contrast. Electronically Signed   By: Franchot Gallo M.D.   On: 07/15/2021 10:03   DG Chest Port 1 View  Result Date: 07/28/2021 CLINICAL DATA:  88 year old female with history of shortness of breath over the past 3 days. EXAM: PORTABLE CHEST 1 VIEW COMPARISON:  Chest x-ray 02/03/2016. FINDINGS: Severe elevation of the left hemidiaphragm. New opacity in the periphery of the left mid lung. Emphysematous  changes throughout the lungs bilaterally. Diffuse peribronchial cuffing. No definite pleural effusions. No pneumothorax. No evidence of pulmonary edema. Heart size is mildly enlarged. Upper mediastinal contours are within normal limits. Atherosclerotic calcifications are noted in the thoracic aorta. IMPRESSION: 1. Opacity in the left mid lung concerning for pneumonia. Alternatively, in the appropriate clinical setting, this could represent hemorrhage from pulmonary infarct. 2. Emphysema. 3. Aortic atherosclerosis. 4. Severe elevation of the left hemidiaphragm. Electronically Signed   By: Vinnie Langton M.D.   On: 08/08/2021 17:49   ECHOCARDIOGRAM COMPLETE  Result Date: 07/23/2021    ECHOCARDIOGRAM REPORT   Patient Name:   [name] [name] Date of Exam: 07/23/2021 Medical Rec #:  [id number]          Height:       63.0 in Accession #:  [id number]         Weight:       99.3 lb Date of Birth:  Apr 26, 1930           BSA:          1.436 m Patient Age:    22 years           BP:           126/55 mmHg Patient Gender: F                  HR:           67 bpm. Exam Location:  [facility] Procedure: 2D Echo, Color Doppler and Cardiac Doppler Indications:     I48.91 Atrial fibrillation  History:         Patient has no prior history of Echocardiogram examinations.                  COPD; Risk Factors:Hypertension.  Sonographer:     Charmayne Sheer Referring Phys:  [id number] Clance Boll BERGE Diagnosing Phys: Ida Rogue MD  Sonographer Comments: Suboptimal subcostal window. Image acquisition challenging due to patient body habitus. IMPRESSIONS  1. Left ventricular ejection fraction, by estimation, is 60 to 65%. The left ventricle has normal function. The left ventricle has no regional wall motion abnormalities. Left ventricular diastolic parameters are consistent with Grade I diastolic dysfunction (impaired relaxation).  2. Right ventricular systolic function is normal. The right ventricular size is normal. Tricuspid regurgitation  signal is inadequate for assessing PA pressure.  3. The mitral valve is normal in structure. No evidence of mitral valve regurgitation. No evidence of mitral stenosis.  4. The aortic valve was not well visualized. Aortic valve regurgitation is not visualized. No aortic stenosis is present.  5. The inferior vena cava is normal in size with greater than 50% respiratory variability, suggesting right atrial pressure of 3 mmHg. FINDINGS  Left Ventricle: Left ventricular ejection fraction, by estimation, is 60 to 65%. The left ventricle has normal function. The left ventricle has no regional wall motion abnormalities. The left ventricular internal cavity size was normal in size. There is  no left ventricular hypertrophy. Left ventricular diastolic parameters are consistent with Grade I diastolic dysfunction (impaired relaxation). Right Ventricle: The right ventricular size is normal. No increase in right ventricular wall thickness. Right ventricular systolic function is normal. Tricuspid regurgitation signal is inadequate for assessing PA pressure. Left Atrium: Left atrial size was normal in size. Right Atrium: Right atrial size was normal in size. Pericardium: There is no evidence of pericardial effusion. Mitral Valve: The mitral valve is normal in structure. No evidence of mitral valve regurgitation. No evidence of mitral valve stenosis. MV peak gradient, 6.0 mmHg. The mean mitral valve gradient is 2.0 mmHg. Tricuspid Valve: The tricuspid valve is normal in structure. Tricuspid valve regurgitation is mild . No evidence of tricuspid stenosis. Aortic Valve: The aortic valve was not well visualized. Aortic valve regurgitation is not visualized. No aortic stenosis is present. Aortic valve mean gradient measures 6.0 mmHg. Aortic valve peak gradient measures 12.2 mmHg. Aortic valve area, by VTI measures 2.15 cm. Pulmonic Valve: The pulmonic valve was normal in structure. Pulmonic valve regurgitation is not visualized. No  evidence of pulmonic stenosis. Aorta: The aortic root is normal in size and structure. Venous: The inferior vena cava is normal in size with greater than 50% respiratory variability, suggesting right atrial pressure of 3 mmHg. IAS/Shunts: No atrial level shunt detected by color  flow Doppler.  LEFT VENTRICLE PLAX 2D LVIDd:         3.33 cm   Diastology LVIDs:         1.96 cm   LV e' medial:    7.94 cm/s LV PW:         0.84 cm   LV E/e' medial:  9.5 LV IVS:        0.79 cm   LV e' lateral:   8.49 cm/s LVOT diam:     1.90 cm   LV E/e' lateral: 8.9 LV SV:         65 LV SV Index:   45 LVOT Area:     2.84 cm  RIGHT VENTRICLE RV Basal diam:  2.62 cm RV S prime:     12.60 cm/s LEFT ATRIUM             Index        RIGHT ATRIUM           Index LA diam:        2.10 cm 1.46 cm/m   RA Area:     11.00 cm LA Vol (A2C):   32.5 ml 22.63 ml/m  RA Volume:   22.30 ml  15.53 ml/m LA Vol (A4C):   33.5 ml 23.33 ml/m LA Biplane Vol: 35.4 ml 24.65 ml/m  AORTIC VALVE                     PULMONIC VALVE AV Area (Vmax):    1.98 cm      PV Vmax:       1.00 m/s AV Area (Vmean):   1.75 cm      PV Vmean:      60.300 cm/s AV Area (VTI):     2.15 cm      PV VTI:        0.170 m AV Vmax:           175.00 cm/s   PV Peak grad:  4.0 mmHg AV Vmean:          118.000 cm/s  PV Mean grad:  2.0 mmHg AV VTI:            0.304 m AV Peak Grad:      12.2 mmHg AV Mean Grad:      6.0 mmHg LVOT Vmax:         122.00 cm/s LVOT Vmean:        72.700 cm/s LVOT VTI:          0.230 m LVOT/AV VTI ratio: 0.76  AORTA Ao Root diam: 3.00 cm MITRAL VALVE MV Area (PHT): 2.53 cm     SHUNTS MV Area VTI:   1.89 cm     Systemic VTI:  0.23 m MV Peak grad:  6.0 mmHg     Systemic Diam: 1.90 cm MV Mean grad:  2.0 mmHg MV Vmax:       1.22 m/s MV Vmean:      58.8 cm/s MV Decel Time: 300 msec MV E velocity: 75.80 cm/s MV A velocity: 112.00 cm/s MV E/A ratio:  0.68 Ida Rogue MD Electronically signed by Ida Rogue MD Signature Date/Time: 07/23/2021/6:08:05 PM    Final      Microbiology: No results found for this or any previous visit (from the past 240 hour(s)).   Labs: CBC: Recent Labs  Lab 07/28/21 0651 07/29/21 0805 07/30/21 0755 07/31/21 0522 08/01/21 0719  WBC 8.6 15.6* 15.8* 10.9* 9.4  HGB  10.4* 11.4* 9.7* 9.3* 9.9*  HCT 31.8* 35.5* 30.2* 30.0* 31.8*  MCV 95.8 94.9 95.3 98.7 100.0  PLT 119* 138* 121* 99* 628*   Basic Metabolic Panel: Recent Labs  Lab 07/28/21 1010 07/29/21 0805 07/29/21 2211 07/30/21 0755 07/31/21 0522 08/01/21 0719  NA  --  150* 149* 148* 142 137  K  --  4.0 3.8 4.0 3.6 4.1  CL  --  107 113* 110 102 102  CO2  --  25 24 26 26 23   GLUCOSE  --  103* 154* 161* 109* 116*  BUN  --  83* 85* 86* 83* 78*  CREATININE  --  3.53* 3.86* 4.07* 3.95* 3.85*  CALCIUM  --  8.9 8.2* 8.2* 8.0* 7.8*  MG 1.6* 2.6*  --  2.2 2.2 1.9  PHOS 4.5 5.0*  --  5.2* 4.1 5.0*   Liver Function Tests: No results for input(s): AST, ALT, ALKPHOS, BILITOT, PROT, ALBUMIN in the last 168 hours. No results for input(s): LIPASE, AMYLASE in the last 168 hours. No results for input(s): AMMONIA in the last 168 hours. Cardiac Enzymes: No results for input(s): CKTOTAL, CKMB, CKMBINDEX, TROPONINI in the last 168 hours. BNP (last 3 results) Recent Labs    08/01/2021 2029  BNP 62.3   CBG: No results for input(s): GLUCAP in the last 168 hours.  Time spent: 35 minutes  Signed:  Val Riles  [facility]  2021/08/30 2:16 PM

## 2021-08-11 NOTE — Progress Notes (Signed)
OT Cancellation Note  Patient Details Name: [name] [name] MRN: [id number] DOB: 22-Aug-1929   Cancelled Treatment:    Reason Eval/Treat Not Completed: Other (comment). Pt now comfort care. OT to sign of. MD informed. Please re-consult should additional needs arise.   Fredirick Maudlin, OTR/L [place]

## 2021-08-11 NOTE — Progress Notes (Signed)
Notified by ccmd pt expired at 11:58. Daughter and son present at bedside. Huey Romans, NP on the unit and notified. Dr. Dwyane Dee also notified of death. [facility] notified. No further needs from family. Post mortuum care completed. Awaiting transport to take body to morgue.

## 2021-08-11 DEATH — deceased
